# Patient Record
Sex: Female | Born: 1955 | Race: White | Hispanic: No | Marital: Married | Smoking: Former smoker
Health system: Southern US, Community
[De-identification: ages and names within clinical notes are randomized; demographics above are authoritative.]

## PROBLEM LIST (undated history)

## (undated) DIAGNOSIS — Z9289 Personal history of other medical treatment: Secondary | ICD-10-CM

## (undated) DIAGNOSIS — I10 Essential (primary) hypertension: Secondary | ICD-10-CM

## (undated) DIAGNOSIS — M1990 Unspecified osteoarthritis, unspecified site: Secondary | ICD-10-CM

## (undated) DIAGNOSIS — Z87442 Personal history of urinary calculi: Secondary | ICD-10-CM

## (undated) DIAGNOSIS — K5900 Constipation, unspecified: Secondary | ICD-10-CM

## (undated) DIAGNOSIS — E785 Hyperlipidemia, unspecified: Secondary | ICD-10-CM

## (undated) DIAGNOSIS — R112 Nausea with vomiting, unspecified: Secondary | ICD-10-CM

## (undated) DIAGNOSIS — M1712 Unilateral primary osteoarthritis, left knee: Secondary | ICD-10-CM

## (undated) DIAGNOSIS — Z9889 Other specified postprocedural states: Secondary | ICD-10-CM

## (undated) DIAGNOSIS — K219 Gastro-esophageal reflux disease without esophagitis: Secondary | ICD-10-CM

## (undated) HISTORY — PX: FOOT SURGERY: SHX648

## (undated) HISTORY — PX: CHOLECYSTECTOMY: SHX55

## (undated) HISTORY — PX: BACK SURGERY: SHX140

## (undated) HISTORY — PX: NECK SURGERY: SHX720

## (undated) HISTORY — PX: ABDOMINAL HYSTERECTOMY: SHX81

---

## 1999-07-25 ENCOUNTER — Encounter: Payer: Self-pay | Admitting: Neurological Surgery

## 1999-07-29 ENCOUNTER — Inpatient Hospital Stay (HOSPITAL_COMMUNITY): Admission: RE | Admit: 1999-07-29 | Discharge: 1999-07-30 | Payer: Self-pay | Admitting: Neurological Surgery

## 1999-07-29 ENCOUNTER — Encounter: Payer: Self-pay | Admitting: Neurological Surgery

## 1999-08-21 ENCOUNTER — Encounter: Admission: RE | Admit: 1999-08-21 | Discharge: 1999-08-21 | Payer: Self-pay | Admitting: Neurological Surgery

## 1999-08-21 ENCOUNTER — Encounter: Payer: Self-pay | Admitting: Neurological Surgery

## 2002-12-18 ENCOUNTER — Encounter: Payer: Self-pay | Admitting: Neurological Surgery

## 2002-12-18 ENCOUNTER — Observation Stay (HOSPITAL_COMMUNITY): Admission: RE | Admit: 2002-12-18 | Discharge: 2002-12-19 | Payer: Self-pay | Admitting: Neurological Surgery

## 2006-03-11 ENCOUNTER — Ambulatory Visit: Payer: Self-pay | Admitting: Internal Medicine

## 2006-03-24 ENCOUNTER — Ambulatory Visit (HOSPITAL_COMMUNITY): Admission: RE | Admit: 2006-03-24 | Discharge: 2006-03-24 | Payer: Self-pay | Admitting: Internal Medicine

## 2006-03-24 ENCOUNTER — Ambulatory Visit: Payer: Self-pay | Admitting: Internal Medicine

## 2006-03-24 HISTORY — PX: COLONOSCOPY: SHX174

## 2006-03-24 HISTORY — PX: ESOPHAGOGASTRODUODENOSCOPY: SHX1529

## 2006-06-08 ENCOUNTER — Ambulatory Visit: Payer: Self-pay | Admitting: Internal Medicine

## 2007-09-09 ENCOUNTER — Ambulatory Visit (HOSPITAL_COMMUNITY): Admission: RE | Admit: 2007-09-09 | Discharge: 2007-09-09 | Payer: Self-pay | Admitting: Neurological Surgery

## 2008-02-28 ENCOUNTER — Inpatient Hospital Stay (HOSPITAL_COMMUNITY): Admission: RE | Admit: 2008-02-28 | Discharge: 2008-03-03 | Payer: Self-pay | Admitting: Neurological Surgery

## 2009-09-16 ENCOUNTER — Encounter: Admission: RE | Admit: 2009-09-16 | Discharge: 2009-09-16 | Payer: Self-pay | Admitting: Neurological Surgery

## 2010-07-27 ENCOUNTER — Encounter: Payer: Self-pay | Admitting: Neurological Surgery

## 2010-11-18 NOTE — Op Note (Signed)
NAMECHANTILLY, Coleman                 ACCOUNT NO.:  [id number]   MEDICAL RECORD NO.:  [id number]          PATIENT TYPE:  INP   LOCATION:  3017                         FACILITY:  [facility]   PHYSICIAN:  Stefani Dama, M.D.  DATE OF BIRTH:  1956/04/15   DATE OF PROCEDURE:  02/28/2008  DATE OF DISCHARGE:                               OPERATIVE REPORT   PREOPERATIVE DIAGNOSIS:  Spondylosis L4-L5 with stenosis, status post  arthrodesis L5-S1 for spondylolisthesis.   POSTOPERATIVE DIAGNOSIS:  Spondylosis L4-L5 with stenosis, status post  arthrodesis L5-S1 for spondylolisthesis.   PROCEDURE:  Posterior decompression L4-L5 with decompression of L4 and  L5 nerve roots, total diskectomy, posterior lumbar interbody fusion L4-5  with PEEK spacers, local autograft and allograft, pedicle screw fixation  L4-L5, and posterolateral arthrodesis L4-L5 also.   SURGEON:  Stefani Dama, MD   FIRST ASSISTANT:  Payton Doughty, MD and Hardin Negus, PA   INDICATIONS:  Kathy Coleman is a 55 year old individual who has previously had  surgery to decompress and stabilize L5-S1 secondary to  spondylolisthesis.  This was done with a Ray cage technique in 1997.  Postoperatively, she had good relief of her symptoms and had been doing  well until the past year's time when she has had increasing amounts of  back pain with bilateral radicular symptoms.  She had failed efforts at  conservative management with intermittent epidural steroid injections  and is now taken to the operating room to undergo surgical decompression  and arthrodesis at the level of L4-L5.   PROCEDURE:  The patient was brought to the operating room and placed  supine on the stretcher.  After smooth induction of general endotracheal  anesthesia, she was turned prone.  The back was prepped with alcohol and  DuraPrep and draped in sterile fashion.  A midline incision was created  and carried down to lumbodorsal fascia.  The area of old surgery was  identified  and neck spinous process up was identified and the spinous  process just beyond this was identified positively with radiography.  Then, the transverse process at the L5 level was exposed and a self-  retaining retractor was placed deep in the wound to expose the L4-L5  space.  Then with using fluoroscopic guidance, pedicle entry sites were  chosen at the L4 level.  Pedicle entry sites were also chosen at L5.  Probes were passed and 6.5 mm tap was used tap holes into the bone.  The  bone holes were checked for lateral cutout, none was identified.  Then,  6.5 x 45 mm screws were placed in L4 and L5.  With the radiographic  confirmation of screw position, fluoro was removed and then the  procedure was continued by removing the inferior marginal lamina of L4  out to and including the complete facetectomies on either side to expose  the common dural tube and the takeoff of the L5 nerve roots.  There is  noted to be significant epidural veins in this area which bled  profusely.  These were cauterized and tamponaded to expose ultimately  the disk space  at L4-L5.  Disk space was then entered first on the left  side using a #15 blade and complete diskectomy was performed at this  opening and by placing a distracting instrument in the interspace at L4-  L5, the right side was exposed and the complete diskectomy was performed  here.  Once the diskectomy was performed and the bony surfaces were  appropriately prepared, 11-mm PEEK spacers were then placed into the  interspace.  They were filled with local autograft, allograft, and some  strips of Infuse.  The bone was then further packed into the disk space  to fill it completely.  Lateral gutters were then decorticated and  packed with similar combination of Infuse mixed with the remaining  allograft and autograft that was available and then the screws were  connected with 35-mm pre-contoured rods.  Rods were torqued down to  final position.  Area was  irrigated copiously and care was taken to make  sure that the L4 and L5 nerve roots were well decompressed in their  travel out the foramen and then the lumbodorsal fascia was  reapproximated with #1 Vicryl in interrupted fashion, 2-0 Vicryl used  subcutaneously, and 3-0 Vicryl subcuticularly.  Dry sterile dressing was  applied to the back.  The patient tolerated the procedure well.  Blood  loss estimated 500 mL.      Stefani Dama, M.D.  Electronically Signed     HJE/MEDQ  D:  02/28/2008  T:  02/29/2008  Job:  [id number]

## 2010-11-21 NOTE — Discharge Summary (Signed)
[place]. [facility]  Patient:    Kathy Coleman                           MRN: [id number] Adm. Date:  [id number] Disc. Date: [id number] Attending:  Jonne Ply                           Discharge Summary  ADMITTING DIAGNOSIS:  Cervical spondylosis with left cervical radiculopathy C4-5 and C5-6.  DISCHARGE DIAGNOSIS:  Cervical spondylosis with left cervical radiculopathy C4-5 and C5-6.  OPERATION:  Anterior cervical diskectomy and arthrodesis C4-5 and C5-6, stabilization with Synthes plate.  CONDITION ON DISCHARGE:  Improving.  HOSPITAL COURSE:  The patient is a 55 year old individual who has had significant left shoulder and left arm pain.  She had evidence of spondylitic disease with foraminal stenosis at C4-5 and C5-6.  She was advised regarding surgical intervention for decompression.  She underwent this procedure on July 29, 1999. She tolerated it well.  Her incision has remained clean and dry.  She had some difficulty with neck, shoulder, and left arm pain that persists at this time. However, she has been advised regarding her activities postoperatively, and will be seen in the office in three weeks time.  Preoperative laboratory studies were within limits of normal.  She was given a prescription for Percocet and Xanax as needed for pain and muscle relaxer respectively. DD:  07/30/99 TD:  07/31/99 Job: [id number]

## 2010-11-21 NOTE — Op Note (Signed)
Kathy Coleman, Kathy Coleman                           ACCOUNT NO.:  [id number]   MEDICAL RECORD NO.:  [id number]                   PATIENT TYPE:  INP   LOCATION:  3172                                 FACILITY:  [facility]   PHYSICIAN:  Stefani Dama, M.D.               DATE OF BIRTH:  03-May-1956   DATE OF PROCEDURE:  12/18/2002  DATE OF DISCHARGE:                                 OPERATIVE REPORT   PREOPERATIVE DIAGNOSIS:  Cervical spondylosis with radiculopathy, C6-C7.  Radiculopathy on the left.   POSTOPERATIVE DIAGNOSIS:  Cervical spondylosis with radiculopathy, C6-C7.  Radiculopathy on the left.   OPERATION PERFORMED:  Anterior cervical decompression, C6-C7.  Arthrodesis  with structural allograft, Synthes plate fixation, C6-C7, removal of  previous hardware C4 to C6.   SURGEON:  Stefani Dama, M.D.   ASSISTANT:  Cristi Loron, M.D.   ANESTHESIA:  General endotracheal.   INDICATIONS FOR PROCEDURE:  The patient is a mid40s individual who has had  anterior cervical diskectomy and arthrodesis at C4-5 and C5-6 approximately  four years ago.  She had good relief of her symptoms but more recently has  developed some left cervical radicular symptoms.  This was related to  spondylitic changes at the C6-C7 level with a large left-sided foraminal  osteophyte.  She was advised regarding conservative treatment; however,  failing a trial of this for the past three months' time, she was advised  that surgical intervention may be the only reasonable solution to consider.  She is taken to the operating room at this time.   DESCRIPTION OF PROCEDURE:  The patient was brought to the operating room  supine on the stretcher.  After smooth induction of general endotracheal  anesthesia, she was placed in five pounds of halter traction.  The neck was  shaved, prepped with DuraPrep and draped in a sterile fashion.  A transverse  incision was made in the left side of her neck through her previously  made  incision.  This was carried down through the platysma and the plane between  the sternocleidomastoid muscle and the strap muscles was then dissected  bluntly until the prevertebral space was reached.  The old plate was  identified and then this was gradually dissected free and skeletonized so  that the six locking screws could be removed by the six plate screws.  The  inferior margin of the plate was noted to have some bone  growing up and  around it and this required the use of a small surgical osteotome to break  free this overgrowing bone and allow the plate to come loose.  The plate was  removed.  The underlying surface was then inspected and there was found to  be a solid arthrodesis at the C4-5 and the C5-6 levels.  After dressing away  some of the soft tissues and the bleeding margins with a bipolar cautery and  some small pledgets of Gelfoam soaked in thrombin that were later irrigated  away, attention was then turned to the C6-C7 disk space.  Self-retaining  Caspar retractor was placed under the longus coli muscle on either side at  this level  to expose the disk space more fully.  The decompression was then  performed by entering the anterior margin of the disk with a 15 blade using  a combination of Kerrison rongeurs then to remove the quantity of disk that  was noted to be significantly desiccated and dehydrated.  The lateral  margins were then decompressed and there was noted to be a significant  uncinate spur on both the right side and even more so on the left side at  the level of C6-C7.  This was drilled down with a 2.3 mm dissecting tool.  Self-retaining disk space spreader was placed in the wound  to ease this  dissection.  This was accomplished from side-to-side and top to bottom with  the help of Dr. Lovell Sheehan, who provided retraction and irrigation during the  dissection procedure.  The posterior longitudinal ligament was opened from  side-to-side completely.   Hemostasis in the epidural space was obtained with  some small pledgets of Gelfoam soaked in thrombin which were later again  irrigated away and hemostasis was checked.  A 7 mm lordotic graft was then  placed into the interspace.  Retraction was removed.  Once the decompression  and arthrodesis was performed, anterior fixation was performed with an 18mm  standard size Synthes plate.  The screws were then locked into position and  a localizing radiograph identified good position of the surgical construct.  The wound was then checked for hemostasis and ultimately platysma was closed  with 3-0 Vicryl in interrupted fashion.  3-0 Vicryl and 4-0 Vicryl was used  in the subcuticular tissues.  DermaBond was placed on the skin.  The patient  tolerated the procedure well and was returned to the recovery room in stable  condition.                                                Stefani Dama, M.D.    Merla Riches  D:  12/18/2002  T:  12/18/2002  Job:  [id number]

## 2010-11-21 NOTE — H&P (Signed)
[place]. [facility]  Patient:    Kathy Coleman                           MRN: [id number] Adm. Date:  [id number] Attending:  Jonne Ply                         History and Physical  ADMISSION DIAGNOSIS:  Cervical spondylosis, C4-5 and C5-6, with right cervical radiculopathy.  HISTORY OF PRESENT ILLNESS:  Patient is a 55 year old individual whose has had significant problems with neck, shoulder and arm pain on the right side.  She was treated previously for degenerative spondylolisthesis in 1997 and underwent surgical arthrodesis; she did well from that process.  She notes that for a several-month period of time, she has been developing increasing problems with ain in the neck, shoulder and right arm, with numbness and tingling, and an MRI was  performed that demonstrates that has significant spondylitic disease at C4-5 and C5-6 with a broad-based disc herniation at C4-5 and even broader-based disc herniation at C5-6 and early spinal cord compression.  She has had significant rm pain, her sleep is interrupted, her grip strength is not as good as it has been on the right-hand side, but she also notes that symptoms have been progressing now to the left-hand side.  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  Her health has otherwise been good.  Lower extremity symptoms have  been nonexistent.  PAST MEDICAL HISTORY:  Her general health has been good.  Back surgery was performed in 1997 and she recovered fully and notes that she can do all of her normal activities without restriction.  She does not smoke.  She does not drink  alcohol.  Her height and weight have been stable at 5 feet 6 inches and 150 pounds.  FAMILY HISTORY:  Negative for any significant medical problems.  REVIEW OF SYSTEMS:  It is noted that she has arm pain and arm weakness and neck  pain; other than that, she wears glasses and the systems sheet  was reviewed with the patient.  MEDICATIONS:  Patient takes Prilosec for acid reflux.  PHYSICAL EXAMINATION:  NEUROLOGIC:  Physical examination reveals that she is alert, oriented, cooperative individual in no overt distress.  Range of motion of her neck reveals that she turns to the right 60 degrees and turns to the left to 80 degrees.  Flexing and  turning to the right reproduces pain in the right shoulder with a positive Spurlings.  She extends and flexes normally.  Axial compression does not reproduce pain.  Otherwise, motor strength in the upper extremities reveals that the deltoids are 5/5, biceps strength is 5/5, wrist extensors are weak on both sides, 4/5, and triceps, grip strength and intrinsics appear to be normal.  Lower extremity strength and reflexes are within the limits of normal.  Deep tendon reflexes are 2+ and both biceps and 2+ in the right triceps, 1+ in the left triceps, 3+ in the patellae, 2+ in the Achilles.  Babinskis are downgoing.  Sensation is intact to pin and vibration in the distal upper extremities.  Palpation of the neck reveals no masses.  No bruits are heard.  Supraclavicular fossa reveals tenderness on the right side to palpation.  Cranial nerve examination reveals the pupils are 4+ and briskly reactive to light and accommodation.  The extraocular movements are full. The  face is symmetric to grimace.  Tongue and uvula are in the midline. Sclerae and conjunctivae are clear.  NECK:  Clear to auscultation.  HEART:  Regular rate and rhythm.  There are no murmurs heard.  ABDOMEN:  Soft.  Bowel sounds are positive.  No masses are palpable.  EXTREMITIES:  No clubbing, cyanosis, or edema.  IMPRESSION:  The patient has evidence of cervical spondylitic disease with broad-based disc herniation at C4-5 and C5-6.  She has right cervical radiculopathy primarily.  She is now being admitted to undergo surgical extirpation of the two levels  involved, followed by arthrodesis using allograft and Synthes fixation.DD:  07/29/99 TD:  07/29/99 Job: [id number]

## 2010-11-21 NOTE — H&P (Signed)
NAMESKYLER, Kathy Coleman                 ACCOUNT NO.:  [id number]   MEDICAL RECORD NO.:  [id number]         PATIENT TYPE:  AMB   LOCATION:  DAY                           FACILITY:  [facility]   PHYSICIAN:  R. Roetta Sessions, M.D. DATE OF BIRTH:  December 08, 1955   DATE OF ADMISSION:  03/11/3006  DATE OF DISCHARGE:  LH                                HISTORY & PHYSICAL   CHIEF COMPLAINT:  Heartburn, epigastric burning.   HISTORY OF PRESENT ILLNESS:  The patient is a 55 year old lady who has a  history of gastroesophageal reflux disease who presents for further  evaluation of recent refractory symptoms.  She was last seen in March 2001.  She had an EGD back in October 1999, had distal esophageal erosions  consistent with erosive reflux esophagitis.  She had a history of dysphagia  and a 59 French Maloney dilator was used without any obvious complications.  She says she had done well up until about 1-2 weeks ago.  She started having  significant burning in her substernal area and epigastrium.  She was  complaining of indigestion.  She feels like she has a lump in her throat.  She has also, over the last several months, noticed increased in difficulty  swallowing certain foods.  She is not able to eat red meat, at all, as she  feels it gets stuck in her esophagus.  She complains of epigastric burning.  She tends to lean towards constipation but for the last one week, has had  one loose stool a day.  She denies any melena or rectal bleeding.  She  started taking OTC Prilosec 40 mg every morning.  This has helped with the  nausea but not really with the burning or indigestion.  She never had a  colonoscopy.   CURRENT MEDICATIONS:  Prilosec OTC 40 mg q.a.m., Aleve p.r.n., usually 3-4  per week.   ALLERGIES:  No known drug allergies.   PAST MEDICAL HISTORY:  Gastroesophageal reflux disease.   PAST SURGICAL HISTORY:  She has had one lumbar back surgery and two cervical  neck surgeries.  She has had a  cholecystectomy for biliary dyskinesia and a  partial hysterectomy.   FAMILY HISTORY:  Mother is 43, has diabetes and COPD.  Father died at age  19, had an MI.  No family history of colorectal cancer.   SOCIAL HISTORY:  She is married with one grandchild.  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  She quit smoking  over 20 years ago.  No alcohol use.   REVIEW OF SYSTEMS:  See HPI for GI.  CONSTITUTIONAL:  No weight loss.  CARDIOPULMONARY:  No chest pain or shortness of breath.   PHYSICAL EXAMINATION:  VITAL SIGNS:  Weight 190 pounds, height 5 foot 7 inches, temperature 98.4,  blood pressure 130/80, pulse 84.  GENERAL:  Pleasant well developed, well nourished Caucasian female in no  acute distress.  SKIN:  Warm and dry, no jaundice.  HEENT:  Sclerae nonicteric.  Oropharyngeal mucosa moist and pink.  No  lesions.  No erythema.  No exudate.  NECK:  No lymphadenopathy or thyromegaly.  CHEST:  Clear to auscultation.  CARDIAC:  Regular rate and rhythm, normal S1 and S2, no murmurs, gallops,  and rubs.  ABDOMEN:  Positive bowel sounds, soft, nontender, nondistended, no  organomegaly or masses, no rebound tenderness or guarding, no abdominal  bruits or herniae.  EXTREMITIES:  No edema.   IMPRESSION:  Ms. Breau is a 55 year old lady with a history of chronic GERD  who recently had a flare up of symptoms.  She has not been on daily PPI but  recently started back on omeprazole with noted improvement of her symptoms.  She also complains of esophageal solid food dysphagia.  Last esophageal  dilatation is outlined above.  She typically has constipation with recent  loose stools.   PLAN:  EGD with esophageal dilatation in the future.  She is also requesting  colonoscopy as she has never had one in the past.      Tana Coast, Pricilla Larsson, M.D.  Electronically Signed    LL/MEDQ  D:  03/11/2006  T:  03/11/2006  Job:  [id number]    cc:   Wyvonnia Lora  Fax: [phone]

## 2010-11-21 NOTE — Op Note (Signed)
Kathy Coleman, Kathy Coleman                 ACCOUNT NO.:  [id number]   MEDICAL RECORD NO.:  [id number]          PATIENT TYPE:  AMB   LOCATION:  DAY                           FACILITY:  [facility]   PHYSICIAN:  R. Roetta Sessions, M.D. DATE OF BIRTH:  11-24-1955   DATE OF PROCEDURE:  03/24/2006  DATE OF DISCHARGE:                                 OPERATIVE REPORT   PROCEDURE:  Esophagogastroduodenoscopy with Elease Hashimoto dilation and screening  colonoscopy.   INDICATIONS FOR PROCEDURE:  Patient is a 55 year old lady with apparent  fracture with gastroesophageal reflux disease, symptoms of recurrent  esophageal dysphagia.  She has been on some OTC omeprazole recently without  any improvement in her symptoms.  We gave her some Zegerid 40 mg capsules  recently.  She felt they were too large and was not able to swallow them.  She also said she put the contents of the capsule in some applesauce, and  this made her sick, so she really has not been on acid suppression therapy  here recently.  She takes Aleve p.r.n.  EGD is now being done, and the  potential for esophageal dilation reviewed.  She has never had her lower GI  tract imaged.  There is no family history of colorectal neoplasia.  EGD and  colonoscopy are now being done.  This approach has been discussed with the  patient at length.  Potential risks, benefits and alternatives have been  reviewed and questions answered.  She is agreeable.  Please see  documentation on the medical record.   PROCEDURE NOTE:  O2 saturation, blood pressure, pulses, and respirations  were monitored throughout the entire procedure.  Conscious sedation with  __________ 125 mg IV and Versed 5 mg IV in divided doses.  Cetacaine spray  for topical oropharyngeal anesthesia.   FINDINGS:  EGD examination of the tubular esophagus revealed no mucosal  abnormalities.  The EG junction was easy to traverse.   STOMACH:  Gastric cavity was empty and insufflated well with air.  A  thorough examination of the gastric mucosa included a retroflexed view of  the proximal stomach.  The esophagogastric junction demonstrated a couple of  antral erosions, otherwise the gastric mucosa appeared normal.  The pylorus  was patent and easily transversed.  Examination of the bulb and second  portion revealed no abnormalities.   THERAPEUTICS/DIAGNOSTIC MANEUVERS PERFORMED:  A 56 French Maloney dilator  was passed to full insertion with ease.  A look back revealed no apparent  complications related to the passage of the dilator.  The patient tolerated  the procedure well and was prepared for colonoscopy.   FINDINGS:  Digital rectal exam revealed no abnormalities.   ENDOSCOPIC FINDINGS:  Prep was good.   RECTAL:  Examination of the rectal mucosa, including retroflexion of the  anal verge, revealed a couple of anal papillae, otherwise rectal mucosa  appeared normal.   COLON:  The colonic mucosa was surveyed from the rectosigmoid junction to  the left transverse, right colon, the appendiceal orifice, the ileocecal  valve, and cecum.  These structures were well seen and photographed  for the  record.  From this level, the scope was slowly withdrawn.  All previously  mentioned mucosal surfaces were again seen.  The patient was noted to have  some scattered sigmoid diverticula, although the remainder of the colonic  mucosa appeared normal.  The patient tolerated the procedure well and was  reactive.   ENDOSCOPY IMPRESSION:  1. Esophagogastroduodenoscopy:  Normal esophagus.  A couple of antral      erosions, otherwise normal stomach, patent pylorus, normal D1, D2.  2. Colonoscopy findings:  A couple of anal papillae, otherwise normal      rectum with left-sided diverticula.  The remainder of the colonic      mucosa appeared normal.   RECOMMENDATIONS:  1. GERD literature and diverticulosis literature were provided to Ms.      Arturo Morton.  2. I began Prevacid 30 mg solutabs 1 tablet  each morning.  3. Follow-up appointment with me in three months.      Jonathon Bellows, M.D.  Electronically Signed     RMR/MEDQ  D:  03/24/2006  T:  03/25/2006  Job:  [id number]   cc:   Wyvonnia Lora  Fax: [phone]

## 2010-11-21 NOTE — Op Note (Signed)
[place]  Patient:    Kathy Coleman                           MRN: [id number] Proc. Date: 07/29/99 Adm. Date:  [id number] Attending:  Jonne Ply                           Operative Report  PREOPERATIVE DIAGNOSIS: Cervical spondylosis with right cervical radiculopathy.  POSTOPERATIVE DIAGNOSIS:  Anterior cervical diskectomy and arthrodesis, C4-C5 and C5-C6, Synthes fixation.  SURGEON: Stefani Dama, M.D.  ASSISTANT: Alanson Aly. Roxan Hockey, M.D.  ANESTHESIA: General endotracheal.  INDICATIONS: The patient is a 55 year old individual who has had significant neck, shoulder and right arm pain.  She has significant spondylitic disease at L4-L5 nd L5-L6.  DESCRIPTION OF PROCEDURE: The patient was brought to the operating room and placed on the table in supine position.  After smooth induction of general endotracheal anesthesia, she was placed in five pounds of traction.  The neck was shaved and  prepped with Dura-Prep and draped in sterile fashion.  A transverse incision was made in the midportion of the neck on the left side.  This was carried down through the platysma and the plane between the sternocleidomastoid and the strap muscles was dissected bluntly until the prevertebral space was reached.  The first identifiable disk space was noted to be that of C4-C5.  Diskectomy was then performed using a combination of Kerrison rongeurs to remove a significant quantity of markedly degenerated disk material.  As the posterior longitudinal ligament as reached there was noted to be thickening of this ligament with disk material that was prolapsed beyond the confines of the vertebral body.  As this was decompressed bone spurs that were forming from the superior margin of the C5 vertebrae were taken down with Midas Rex and an A2 bur.  The uncinate process was similarly taken down.  The lateral recess was decompressed in this area.   However, large epidural veins were encountered.  These bled profusely and required repetitive packing with Gelfoam and thrombin which, after hemostasis was achieved these packings were later removed.  The area was then decompressed out to the left side in similar fashion and a 7 mm round fibular graft was placed into the interspace.  Attention was turned to C5-C6 and a similar procedure was carried out here noting again similar subligamentous protrusion of disk material  With this area decompressed and hemostasis being achieved in a similar fashion 7 mm round fibular graft was placed.  Traction was removed and the neck was placed in slight flexion.  A 37 mm Synthes plate was then contoured to the appropriate shape and placed over the interspaces involved and secured with six locking 4 x 14 mm  screws.  Localizing radiograph identified good position of the hardware and the  implants.  The area was then checked for hemostasis in the soft tissue and the platysma was closed with 2-0 Vicryl in interrupted fashion.  Then 3-0 Vicryl was used subcuticularly to close the skin.  A dry sterile dressing was placed on the skin. DD:  07/29/99 TD:  07/29/99 Job: [id number]

## 2010-11-21 NOTE — Discharge Summary (Signed)
NAMEALIMA, Kathy Coleman                 ACCOUNT NO.:  [id number]   MEDICAL RECORD NO.:  [id number]          PATIENT TYPE:  INP   LOCATION:  3017                         FACILITY:  [facility]   PHYSICIAN:  Stefani Dama, M.D.  DATE OF BIRTH:  12/08/1955   DATE OF ADMISSION:  02/28/2008  DATE OF DISCHARGE:  03/03/2008                               DISCHARGE SUMMARY   ADMITTING DIAGNOSES:  Lumbar spondylosis, lumbar radiculopathy L4-L5.   DISCHARGE DIAGNOSES:  Lumbar spondylosis, lumbar radiculopathy L4-L5.   OPERATION PROCEDURE:  Posterior spinal fusion with posterior lumbar  interbody fusion, L4-L5 with pedicle screw fixation, decompression of  the L4 and L5 nerve roots by Dr. Barnett Abu.   BRIEF HISTORY:  The patient is a 55 year old female who had ongoing low  back pain, progressively worsening with bilateral lower extremity  radiculopathy.  Found to have spondylosis and degenerative disk disease  at L4-L5, failed conservative measures, likes to proceed with surgical  intervention.   HOSPITAL COURSE:  The patient underwent surgical intervention and  posterior spinal fusion and decompression of L4-L5 on 02/28/2008.  She  tolerated the procedure well, stable at recovery room, placed on the  floor, and placed on PCA Dilaudid pump.  Aspen QuikDraw brace was  obtained.  Sequential compression devices, prophylaxis against DVT.  First day postoperatively, the patient was eating well.  Vital signs  stable.  Wounds are being.  Neurovascularly intact, no focal deficits.  She was weaned off her PCA Dilaudid pump to p.o. pain medicines.  Foley  catheter was discontinued.  Started with physical therapy, occupational  therapy.  Discharge planning was arranged.  Her diet was advanced.  On  the second day postoperatively, the patient was doing okay.  We  increased Percocet 2 p.o. q.4 to 6 h. p.r.n. pain.  She was  neurovascularly intact.  Vital signs are stable.  No focal deficits.  She did have some  left anterior thigh pain.  Her wound has some mild  drainage, but no signs of infection.  Dressing was reinforced.  Discharge planning was arranged.  She was ready for discharge home on  03/02/2008.  CT scan post surgery was obtained because of the left  anterior thigh pain and showed good placement of the pedicle screws.  No  nerve compression.  Eating well, voiding well, and neurovascularly  intact.  Vital signs, stable and afebrile.   DISCHARGE CONDITION:  Stable and improved.   DISCHARGE INSTRUCTIONS:  Discharged home with prescriptions for Percocet  1 p.o. q.4 to 6 h. p.r.n. pain, Valium 5 mg 1 p.o. q.6 to 8 h. p.r.n.  muscle spasm.  Continue with physical therapy and occupational therapy  until discharge, immobilization.  Continue back precautions.  Follow up  with Dr. Danielle Dess in 3 weeks.  Contact our office prior to follow up if  she has any questions or concerns.      Aura Fey Bobbe Medico.      Stefani Dama, M.D.  Electronically Signed    SCI/MEDQ  D:  05/09/2008  T:  05/10/2008  Job:  [id number]

## 2013-05-26 ENCOUNTER — Other Ambulatory Visit (HOSPITAL_COMMUNITY): Payer: Self-pay | Admitting: Neurological Surgery

## 2013-05-26 ENCOUNTER — Other Ambulatory Visit: Payer: Self-pay | Admitting: Neurological Surgery

## 2013-05-26 DIAGNOSIS — IMO0002 Reserved for concepts with insufficient information to code with codable children: Secondary | ICD-10-CM

## 2013-05-30 ENCOUNTER — Encounter (HOSPITAL_COMMUNITY): Payer: Self-pay

## 2013-06-07 ENCOUNTER — Encounter (HOSPITAL_COMMUNITY): Payer: Self-pay

## 2013-06-07 ENCOUNTER — Ambulatory Visit (HOSPITAL_COMMUNITY)
Admission: RE | Admit: 2013-06-07 | Discharge: 2013-06-07 | Disposition: A | Discharge status: Home / Self Care | Payer: Medicare FFS | Source: Ambulatory Visit | Attending: Neurological Surgery | Admitting: Neurological Surgery

## 2013-06-07 DIAGNOSIS — M51379 Other intervertebral disc degeneration, lumbosacral region without mention of lumbar back pain or lower extremity pain: Secondary | ICD-10-CM | POA: Insufficient documentation

## 2013-06-07 DIAGNOSIS — M47817 Spondylosis without myelopathy or radiculopathy, lumbosacral region: Secondary | ICD-10-CM | POA: Insufficient documentation

## 2013-06-07 DIAGNOSIS — I7090 Unspecified atherosclerosis: Secondary | ICD-10-CM | POA: Insufficient documentation

## 2013-06-07 DIAGNOSIS — IMO0002 Reserved for concepts with insufficient information to code with codable children: Secondary | ICD-10-CM

## 2013-06-07 DIAGNOSIS — Z981 Arthrodesis status: Secondary | ICD-10-CM | POA: Insufficient documentation

## 2013-06-07 DIAGNOSIS — I700 Atherosclerosis of aorta: Secondary | ICD-10-CM | POA: Insufficient documentation

## 2013-06-07 DIAGNOSIS — M5137 Other intervertebral disc degeneration, lumbosacral region: Secondary | ICD-10-CM | POA: Insufficient documentation

## 2013-06-07 MED ORDER — IOHEXOL 180 MG/ML  SOLN
20.0000 mL | Freq: Once | INTRAMUSCULAR | Status: AC | PRN
  Administered 2013-06-07: 14 mL via INTRATHECAL

## 2013-06-07 MED ORDER — HYDROCODONE-ACETAMINOPHEN 5-325 MG PO TABS
1.0000 | ORAL_TABLET | ORAL | Status: DC | PRN
  Filled 2013-06-07: qty 2

## 2013-06-07 MED ORDER — ONDANSETRON HCL 4 MG/2ML IJ SOLN: 4.0000 mg | Freq: Four times a day (QID) | INTRAMUSCULAR | Status: DC | PRN

## 2013-06-07 MED ORDER — DIAZEPAM 5 MG PO TABS
10.0000 mg | ORAL_TABLET | Freq: Once | ORAL | Status: AC
  Administered 2013-06-07: 10 mg via ORAL
  Filled 2013-06-07: qty 2

## 2013-06-07 MED ORDER — DIAZEPAM 5 MG PO TABS
ORAL_TABLET | ORAL | Status: AC
  Administered 2013-06-07: 10 mg via ORAL
  Filled 2013-06-07: qty 2

## 2013-06-07 NOTE — Procedures (Signed)
Kathy Coleman is a 57 year old individual is had previous surgery at L5-S1 L4-L5 she has advanced evidence of degenerative changes at the adjacent level LIII-L4 has a presence of hardware myelogram is now being performed to better visualize any significant stenosis at the level of L3-L4.  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 11 cc Findings: Degenerative stenosis L3-L4 myelogram

## 2013-06-15 DIAGNOSIS — M25559 Pain in unspecified hip: Secondary | ICD-10-CM | POA: Insufficient documentation

## 2013-06-16 ENCOUNTER — Other Ambulatory Visit: Payer: Self-pay | Admitting: Neurological Surgery

## 2013-06-16 DIAGNOSIS — M25551 Pain in right hip: Secondary | ICD-10-CM

## 2013-06-28 ENCOUNTER — Ambulatory Visit
Admission: RE | Admit: 2013-06-28 | Discharge: 2013-06-28 | Disposition: A | Discharge status: Home / Self Care | Payer: Medicare FFS | Source: Ambulatory Visit | Attending: Neurological Surgery | Admitting: Neurological Surgery

## 2013-06-28 DIAGNOSIS — M25551 Pain in right hip: Secondary | ICD-10-CM

## 2013-06-28 MED ORDER — IOHEXOL 180 MG/ML  SOLN
1.0000 mL | Freq: Once | INTRAMUSCULAR | Status: AC | PRN
  Administered 2013-06-28: 1 mL via INTRA_ARTICULAR

## 2013-06-28 MED ORDER — METHYLPREDNISOLONE ACETATE 40 MG/ML INJ SUSP (RADIOLOG
120.0000 mg | Freq: Once | INTRAMUSCULAR | Status: AC
  Administered 2013-06-28: 120 mg via INTRA_ARTICULAR

## 2013-08-03 DIAGNOSIS — M5414 Radiculopathy, thoracic region: Secondary | ICD-10-CM | POA: Insufficient documentation

## 2013-08-24 DIAGNOSIS — M5417 Radiculopathy, lumbosacral region: Secondary | ICD-10-CM | POA: Insufficient documentation

## 2013-09-14 DIAGNOSIS — G8929 Other chronic pain: Secondary | ICD-10-CM | POA: Insufficient documentation

## 2014-06-22 ENCOUNTER — Other Ambulatory Visit: Payer: Self-pay | Admitting: Neurological Surgery

## 2014-07-12 NOTE — Pre-Procedure Instructions (Signed)
Kathy Coleman  07/12/2014   Your procedure is scheduled on:  Monday, January 18th  [facility] at 645 AM.  Call this number if you have problems the morning of surgery: [phone]   Remember:   Do not eat food or drink liquids after midnight.   Take these medicines the morning of surgery with A SIP OF WATER: ultram or hydrocodone if needed   Do not wear jewelry, make-up or nail polish.  Do not wear lotions, powders, or perfumes. You may wear deodorant.  Do not shave 48 hours prior to surgery. Men may shave face and neck.  Do not bring valuables to the hospital.  [facility] is not responsible  for any belongings or valuables.               Contacts, dentures or bridgework may not be worn into surgery.  Leave suitcase in the car. After surgery it may be brought to your room.  For patients admitted to the hospital, discharge time is determined by your  treatment team.      Please read over the following fact sheets that you were given: Pain Booklet, Coughing and Deep Breathing, Blood Transfusion Information, MRSA Information and Surgical Site Infection Prevention  [place] - Preparing for Surgery  Before surgery, you can play an important role.  Because skin is not sterile, your skin needs to be as free of germs as possible.  You can reduce the number of germs on you skin by washing with CHG (chlorahexidine gluconate) soap before surgery.  CHG is an antiseptic cleaner which kills germs and bonds with the skin to continue killing germs even after washing.  Please DO NOT use if you have an allergy to CHG or antibacterial soaps.  If your skin becomes reddened/irritated stop using the CHG and inform your nurse when you arrive at Short Stay.  Do not shave (including legs and underarms) for at least 48 hours prior to the first CHG shower.  You may shave your face.  Please follow these instructions carefully:   1.  Shower with CHG Soap the night before  surgery and the morning of Surgery.  2.  If you choose to wash your hair, wash your hair first as usual with your normal shampoo.  3.  After you shampoo, rinse your hair and body thoroughly to remove the shampoo.  4.  Use CHG as you would any other liquid soap.  You can apply CHG directly to the skin and wash gently with scrungie or a clean washcloth.  5.  Apply the CHG Soap to your body ONLY FROM THE NECK DOWN.  Do not use on open wounds or open sores.  Avoid contact with your eyes, ears, mouth and genitals (private parts).  Wash genitals (private parts) with your normal soap.  6.  Wash thoroughly, paying special attention to the area where your surgery will be performed.  7.  Thoroughly rinse your body with warm water from the neck down.  8.  DO NOT shower/wash with your normal soap after using and rinsing off the CHG Soap.  9.  Pat yourself dry with a clean towel.            10.  Wear clean pajamas.            11.  Place clean sheets on your bed the night of your first shower and do not sleep with pets.  Day of Surgery  Do not  apply any lotions/deoderants the morning of surgery.  Please wear clean clothes to the hospital/surgery center.

## 2014-07-13 ENCOUNTER — Encounter (HOSPITAL_COMMUNITY): Payer: Self-pay

## 2014-07-13 ENCOUNTER — Encounter (HOSPITAL_COMMUNITY)
Admission: RE | Admit: 2014-07-13 | Discharge: 2014-07-13 | Disposition: A | Discharge status: Home / Self Care | Payer: Medicare FFS | Source: Ambulatory Visit | Attending: Neurological Surgery | Admitting: Neurological Surgery

## 2014-07-13 DIAGNOSIS — Z87891 Personal history of nicotine dependence: Secondary | ICD-10-CM | POA: Insufficient documentation

## 2014-07-13 DIAGNOSIS — Z01818 Encounter for other preprocedural examination: Secondary | ICD-10-CM | POA: Insufficient documentation

## 2014-07-13 DIAGNOSIS — I10 Essential (primary) hypertension: Secondary | ICD-10-CM | POA: Diagnosis not present

## 2014-07-13 DIAGNOSIS — I252 Old myocardial infarction: Secondary | ICD-10-CM | POA: Diagnosis not present

## 2014-07-13 HISTORY — DX: Other specified postprocedural states: Z98.890

## 2014-07-13 HISTORY — DX: Essential (primary) hypertension: I10

## 2014-07-13 HISTORY — DX: Unspecified osteoarthritis, unspecified site: M19.90

## 2014-07-13 HISTORY — DX: Nausea with vomiting, unspecified: R11.2

## 2014-07-13 LAB — SURGICAL PCR SCREEN
MRSA, PCR: POSITIVE — AB
Staphylococcus aureus: POSITIVE — AB

## 2014-07-13 LAB — CBC
HCT: 39.8 % (ref 36.0–46.0)
Hemoglobin: 13.2 g/dL (ref 12.0–15.0)
MCH: 30.8 pg (ref 26.0–34.0)
MCHC: 33.2 g/dL (ref 30.0–36.0)
MCV: 93 fL (ref 78.0–100.0)
Platelets: 221 10*3/uL (ref 150–400)
RBC: 4.28 MIL/uL (ref 3.87–5.11)
RDW: 12.7 % (ref 11.5–15.5)
WBC: 5.4 10*3/uL (ref 4.0–10.5)

## 2014-07-13 LAB — BASIC METABOLIC PANEL
Anion gap: 5 (ref 5–15)
BUN: 15 mg/dL (ref 6–23)
CALCIUM: 9.4 mg/dL (ref 8.4–10.5)
CHLORIDE: 105 meq/L (ref 96–112)
CO2: 30 mmol/L (ref 19–32)
Creatinine, Ser: 0.88 mg/dL (ref 0.50–1.10)
GFR calc Af Amer: 82 mL/min — ABNORMAL LOW (ref >90)
GFR calc non Af Amer: 71 mL/min — ABNORMAL LOW (ref >90)
Glucose, Bld: 106 mg/dL — ABNORMAL HIGH (ref 70–99)
POTASSIUM: 4.4 mmol/L (ref 3.5–5.1)
Sodium: 140 mmol/L (ref 135–145)

## 2014-07-13 NOTE — Progress Notes (Signed)
CALLED IN MUPIROCIN RX. TO [facility].

## 2014-07-13 NOTE — Progress Notes (Signed)
Anesthesia Chart Review:  Pt is 59 year old female scheduled for L2-3, L3-4 PLIF on 07/23/2014 with Dr. Danielle DessElsner.   PMH includes: HTN. Former smoker.   Preoperative labs reviewed.    EKG: NSR. Inferior infarct, age undetermined. Possible anterior infarct, age undetermined. Appears unchanged from previous from 2001 and 2009.   If no changes, I anticipate pt can proceed with surgery as scheduled.   Rica Mastngela , FNP-BC [facility]/Anesthesiology Phone: [phone] 07/13/2014 4:07 PM

## 2014-07-22 MED ORDER — CEFAZOLIN SODIUM-DEXTROSE 2-3 GM-% IV SOLR
2.0000 g | INTRAVENOUS | Status: AC
  Administered 2014-07-23 (×2): 2 g via INTRAVENOUS
  Filled 2014-07-22: qty 50

## 2014-07-23 ENCOUNTER — Inpatient Hospital Stay (HOSPITAL_COMMUNITY)
Admission: RE | Admit: 2014-07-23 | Discharge: 2014-07-26 | DRG: 460 | Disposition: A | Discharge status: Home / Self Care | Payer: Medicare FFS | Source: Ambulatory Visit | Attending: Neurological Surgery | Admitting: Neurological Surgery

## 2014-07-23 ENCOUNTER — Encounter (HOSPITAL_COMMUNITY): Payer: Self-pay | Admitting: *Deleted

## 2014-07-23 ENCOUNTER — Inpatient Hospital Stay (HOSPITAL_COMMUNITY): Payer: Medicare FFS

## 2014-07-23 ENCOUNTER — Inpatient Hospital Stay (HOSPITAL_COMMUNITY): Payer: Medicare FFS | Admitting: Certified Registered Nurse Anesthetist

## 2014-07-23 ENCOUNTER — Encounter (HOSPITAL_COMMUNITY)
Admission: RE | Disposition: A | Discharge status: Home / Self Care | Payer: Medicare FFS | Source: Ambulatory Visit | Attending: Neurological Surgery

## 2014-07-23 ENCOUNTER — Inpatient Hospital Stay (HOSPITAL_COMMUNITY): Payer: Medicare FFS | Admitting: Emergency Medicine

## 2014-07-23 DIAGNOSIS — Z87891 Personal history of nicotine dependence: Secondary | ICD-10-CM | POA: Diagnosis not present

## 2014-07-23 DIAGNOSIS — M48061 Spinal stenosis, lumbar region without neurogenic claudication: Secondary | ICD-10-CM

## 2014-07-23 DIAGNOSIS — G9741 Accidental puncture or laceration of dura during a procedure: Secondary | ICD-10-CM | POA: Diagnosis not present

## 2014-07-23 DIAGNOSIS — Y658 Other specified misadventures during surgical and medical care: Secondary | ICD-10-CM | POA: Diagnosis not present

## 2014-07-23 DIAGNOSIS — D62 Acute posthemorrhagic anemia: Secondary | ICD-10-CM | POA: Diagnosis not present

## 2014-07-23 DIAGNOSIS — M4806 Spinal stenosis, lumbar region: Secondary | ICD-10-CM | POA: Diagnosis present

## 2014-07-23 DIAGNOSIS — M4726 Other spondylosis with radiculopathy, lumbar region: Secondary | ICD-10-CM | POA: Diagnosis present

## 2014-07-23 DIAGNOSIS — M5416 Radiculopathy, lumbar region: Secondary | ICD-10-CM | POA: Diagnosis present

## 2014-07-23 DIAGNOSIS — S0500XA Injury of conjunctiva and corneal abrasion without foreign body, unspecified eye, initial encounter: Secondary | ICD-10-CM | POA: Diagnosis not present

## 2014-07-23 DIAGNOSIS — I10 Essential (primary) hypertension: Secondary | ICD-10-CM | POA: Diagnosis present

## 2014-07-23 DIAGNOSIS — M549 Dorsalgia, unspecified: Secondary | ICD-10-CM | POA: Diagnosis present

## 2014-07-23 LAB — POCT I-STAT 4, (NA,K, GLUC, HGB,HCT)
Glucose, Bld: 123 mg/dL — ABNORMAL HIGH (ref 70–99)
HEMATOCRIT: 29 % — AB (ref 36.0–46.0)
Hemoglobin: 9.9 g/dL — ABNORMAL LOW (ref 12.0–15.0)
Potassium: 4.3 mmol/L (ref 3.5–5.1)
Sodium: 142 mmol/L (ref 135–145)

## 2014-07-23 SURGERY — POSTERIOR LUMBAR FUSION 2 LEVEL
Anesthesia: General | Site: Back

## 2014-07-23 MED ORDER — TRAMADOL HCL 50 MG PO TABS: 50.0000 mg | ORAL_TABLET | Freq: Three times a day (TID) | ORAL | Status: DC | PRN

## 2014-07-23 MED ORDER — THROMBIN 20000 UNITS EX SOLR
CUTANEOUS | Status: DC | PRN
  Administered 2014-07-23: 10:00:00 via TOPICAL

## 2014-07-23 MED ORDER — HYDROCODONE-ACETAMINOPHEN 10-325 MG PO TABS
1.0000 | ORAL_TABLET | Freq: Every evening | ORAL | Status: DC | PRN
  Administered 2014-07-23 – 2014-07-24 (×2): 1 via ORAL
  Filled 2014-07-23 (×3): qty 1

## 2014-07-23 MED ORDER — SODIUM CHLORIDE 0.9 % IV SOLN: 250.0000 mL | INTRAVENOUS | Status: DC

## 2014-07-23 MED ORDER — ONDANSETRON HCL 4 MG/2ML IJ SOLN
4.0000 mg | INTRAMUSCULAR | Status: DC | PRN
  Filled 2014-07-23: qty 2

## 2014-07-23 MED ORDER — HYDROMORPHONE HCL 1 MG/ML IJ SOLN
INTRAMUSCULAR | Status: AC
  Administered 2014-07-23: 0.5 mg via INTRAVENOUS
  Filled 2014-07-23: qty 1

## 2014-07-23 MED ORDER — ATORVASTATIN CALCIUM 10 MG PO TABS
20.0000 mg | ORAL_TABLET | Freq: Every day | ORAL | Status: DC
  Administered 2014-07-23 – 2014-07-26 (×4): 20 mg via ORAL
  Filled 2014-07-23 (×4): qty 2

## 2014-07-23 MED ORDER — 0.9 % SODIUM CHLORIDE (POUR BTL) OPTIME
TOPICAL | Status: DC | PRN
  Administered 2014-07-23: 1000 mL

## 2014-07-23 MED ORDER — FENTANYL CITRATE 0.05 MG/ML IJ SOLN
INTRAMUSCULAR | Status: AC
  Filled 2014-07-23: qty 5

## 2014-07-23 MED ORDER — VECURONIUM BROMIDE 10 MG IV SOLR
INTRAVENOUS | Status: DC | PRN
  Administered 2014-07-23 (×3): 2 mg via INTRAVENOUS
  Administered 2014-07-23: 1 mg via INTRAVENOUS
  Administered 2014-07-23 (×2): 2 mg via INTRAVENOUS

## 2014-07-23 MED ORDER — ROCURONIUM BROMIDE 100 MG/10ML IV SOLN
INTRAVENOUS | Status: DC | PRN
  Administered 2014-07-23: 50 mg via INTRAVENOUS

## 2014-07-23 MED ORDER — SODIUM CHLORIDE 0.9 % IJ SOLN: 3.0000 mL | INTRAMUSCULAR | Status: DC | PRN

## 2014-07-23 MED ORDER — SCOPOLAMINE 1 MG/3DAYS TD PT72
MEDICATED_PATCH | TRANSDERMAL | Status: AC
Start: 2014-07-23 — End: 2014-07-23
  Administered 2014-07-23: 1.5 mg
  Filled 2014-07-23: qty 1

## 2014-07-23 MED ORDER — PHENYLEPHRINE HCL 10 MG/ML IJ SOLN
10.0000 mg | INTRAVENOUS | Status: DC | PRN
  Administered 2014-07-23: 10 ug/min via INTRAVENOUS

## 2014-07-23 MED ORDER — LACTATED RINGERS IV SOLN
INTRAVENOUS | Status: DC | PRN
  Administered 2014-07-23 (×2): via INTRAVENOUS

## 2014-07-23 MED ORDER — MENTHOL 3 MG MT LOZG: 1.0000 | LOZENGE | OROMUCOSAL | Status: DC | PRN

## 2014-07-23 MED ORDER — SENNA 8.6 MG PO TABS
1.0000 | ORAL_TABLET | Freq: Two times a day (BID) | ORAL | Status: DC
  Administered 2014-07-23 – 2014-07-26 (×6): 8.6 mg via ORAL
  Filled 2014-07-23 (×6): qty 1

## 2014-07-23 MED ORDER — SCOPOLAMINE 1 MG/3DAYS TD PT72
1.0000 | MEDICATED_PATCH | Freq: Once | TRANSDERMAL | Status: DC
  Filled 2014-07-23: qty 1

## 2014-07-23 MED ORDER — KETOROLAC TROMETHAMINE 15 MG/ML IJ SOLN
15.0000 mg | Freq: Four times a day (QID) | INTRAMUSCULAR | Status: AC
  Administered 2014-07-23 – 2014-07-24 (×5): 15 mg via INTRAVENOUS
  Filled 2014-07-23 (×7): qty 1

## 2014-07-23 MED ORDER — THROMBIN 5000 UNITS EX SOLR
OROMUCOSAL | Status: DC | PRN
  Administered 2014-07-23: 10:00:00 via TOPICAL
  Administered 2014-07-23: 10 mL via TOPICAL

## 2014-07-23 MED ORDER — BUPIVACAINE HCL (PF) 0.5 % IJ SOLN
INTRAMUSCULAR | Status: DC | PRN
  Administered 2014-07-23: 10 mL
  Administered 2014-07-23: 20 mL

## 2014-07-23 MED ORDER — ACETAMINOPHEN 650 MG RE SUPP: 650.0000 mg | RECTAL | Status: DC | PRN

## 2014-07-23 MED ORDER — MIDAZOLAM HCL 2 MG/2ML IJ SOLN
INTRAMUSCULAR | Status: AC
  Filled 2014-07-23: qty 2

## 2014-07-23 MED ORDER — KETOROLAC TROMETHAMINE 15 MG/ML IJ SOLN
INTRAMUSCULAR | Status: AC
  Administered 2014-07-23: 15 mg via INTRAVENOUS
  Filled 2014-07-23: qty 1

## 2014-07-23 MED ORDER — GABAPENTIN 300 MG PO CAPS
300.0000 mg | ORAL_CAPSULE | Freq: Three times a day (TID) | ORAL | Status: DC
  Administered 2014-07-23 – 2014-07-26 (×9): 300 mg via ORAL
  Filled 2014-07-23 (×9): qty 1

## 2014-07-23 MED ORDER — SODIUM CHLORIDE 0.9 % IV SOLN
INTRAVENOUS | Status: DC | PRN
  Administered 2014-07-23 (×2): via INTRAVENOUS

## 2014-07-23 MED ORDER — PROPOFOL 10 MG/ML IV BOLUS
INTRAVENOUS | Status: AC
  Filled 2014-07-23: qty 20

## 2014-07-23 MED ORDER — ONDANSETRON HCL 4 MG/2ML IJ SOLN
INTRAMUSCULAR | Status: DC | PRN
  Administered 2014-07-23: 4 mg via INTRAVENOUS

## 2014-07-23 MED ORDER — FLEET ENEMA 7-19 GM/118ML RE ENEM: 1.0000 | ENEMA | Freq: Once | RECTAL | Status: AC | PRN

## 2014-07-23 MED ORDER — GLYCOPYRROLATE 0.2 MG/ML IJ SOLN
INTRAMUSCULAR | Status: DC | PRN
  Administered 2014-07-23: .6 mg via INTRAVENOUS

## 2014-07-23 MED ORDER — FENTANYL CITRATE 0.05 MG/ML IJ SOLN
INTRAMUSCULAR | Status: DC | PRN
Start: 2014-07-23 — End: 2014-07-23
  Administered 2014-07-23: 150 ug via INTRAVENOUS
  Administered 2014-07-23 (×5): 50 ug via INTRAVENOUS

## 2014-07-23 MED ORDER — ONDANSETRON HCL 4 MG/2ML IJ SOLN: 4.0000 mg | Freq: Four times a day (QID) | INTRAMUSCULAR | Status: DC | PRN

## 2014-07-23 MED ORDER — SODIUM CHLORIDE 0.9 % IV SOLN
INTRAVENOUS | Status: DC
  Administered 2014-07-23: 18:00:00 via INTRAVENOUS

## 2014-07-23 MED ORDER — MIDAZOLAM HCL 5 MG/5ML IJ SOLN
INTRAMUSCULAR | Status: DC | PRN
Start: 2014-07-23 — End: 2014-07-23
  Administered 2014-07-23: 2 mg via INTRAVENOUS

## 2014-07-23 MED ORDER — HYDROMORPHONE HCL 1 MG/ML IJ SOLN
INTRAMUSCULAR | Status: AC
  Filled 2014-07-23: qty 1

## 2014-07-23 MED ORDER — ALUM & MAG HYDROXIDE-SIMETH 200-200-20 MG/5ML PO SUSP: 30.0000 mL | Freq: Four times a day (QID) | ORAL | Status: DC | PRN

## 2014-07-23 MED ORDER — PROPOFOL 10 MG/ML IV BOLUS
INTRAVENOUS | Status: DC | PRN
  Administered 2014-07-23: 150 mg via INTRAVENOUS

## 2014-07-23 MED ORDER — BISACODYL 10 MG RE SUPP: 10.0000 mg | Freq: Every day | RECTAL | Status: DC | PRN

## 2014-07-23 MED ORDER — PHENOL 1.4 % MT LIQD: 1.0000 | OROMUCOSAL | Status: DC | PRN

## 2014-07-23 MED ORDER — LIDOCAINE HCL (CARDIAC) 20 MG/ML IV SOLN
INTRAVENOUS | Status: DC | PRN
  Administered 2014-07-23: 75 mg via INTRAVENOUS

## 2014-07-23 MED ORDER — OXYCODONE-ACETAMINOPHEN 5-325 MG PO TABS
1.0000 | ORAL_TABLET | ORAL | Status: DC | PRN
  Administered 2014-07-24 – 2014-07-26 (×10): 2 via ORAL
  Administered 2014-07-26: 1 via ORAL
  Filled 2014-07-23 (×12): qty 2

## 2014-07-23 MED ORDER — HYDROMORPHONE HCL 1 MG/ML IJ SOLN
0.5000 mg | INTRAMUSCULAR | Status: DC | PRN
  Administered 2014-07-23 – 2014-07-24 (×3): 1 mg via INTRAVENOUS
  Administered 2014-07-25: 0.5 mg via INTRAVENOUS
  Administered 2014-07-25: 1 mg via INTRAVENOUS
  Administered 2014-07-26: 0.5 mg via INTRAVENOUS
  Filled 2014-07-23 (×6): qty 1

## 2014-07-23 MED ORDER — METHOCARBAMOL 500 MG PO TABS
500.0000 mg | ORAL_TABLET | Freq: Four times a day (QID) | ORAL | Status: DC | PRN
  Administered 2014-07-24 – 2014-07-26 (×4): 500 mg via ORAL
  Filled 2014-07-23 (×5): qty 1

## 2014-07-23 MED ORDER — SODIUM CHLORIDE 0.9 % IJ SOLN
3.0000 mL | Freq: Two times a day (BID) | INTRAMUSCULAR | Status: DC
  Administered 2014-07-25: 3 mL via INTRAVENOUS

## 2014-07-23 MED ORDER — CEFAZOLIN SODIUM 1-5 GM-% IV SOLN
1.0000 g | Freq: Three times a day (TID) | INTRAVENOUS | Status: AC
  Administered 2014-07-23 – 2014-07-24 (×2): 1 g via INTRAVENOUS
  Filled 2014-07-23 (×2): qty 50

## 2014-07-23 MED ORDER — DOCUSATE SODIUM 100 MG PO CAPS
100.0000 mg | ORAL_CAPSULE | Freq: Two times a day (BID) | ORAL | Status: DC
  Administered 2014-07-23 – 2014-07-26 (×6): 100 mg via ORAL
  Filled 2014-07-23 (×6): qty 1

## 2014-07-23 MED ORDER — DEXAMETHASONE 4 MG PO TABS
4.0000 mg | ORAL_TABLET | Freq: Two times a day (BID) | ORAL | Status: DC
  Administered 2014-07-23 – 2014-07-26 (×6): 4 mg via ORAL
  Filled 2014-07-23 (×6): qty 1

## 2014-07-23 MED ORDER — FAMOTIDINE 20 MG PO TABS
20.0000 mg | ORAL_TABLET | Freq: Two times a day (BID) | ORAL | Status: DC
  Administered 2014-07-23 – 2014-07-26 (×6): 20 mg via ORAL
  Filled 2014-07-23 (×6): qty 1

## 2014-07-23 MED ORDER — TOBRAMYCIN-DEXAMETHASONE 0.3-0.1 % OP SUSP
2.0000 [drp] | OPHTHALMIC | Status: DC
  Administered 2014-07-23 – 2014-07-26 (×15): 2 [drp] via OPHTHALMIC
  Filled 2014-07-23: qty 2.5

## 2014-07-23 MED ORDER — KETOROLAC TROMETHAMINE 0.5 % OP SOLN
1.0000 [drp] | Freq: Three times a day (TID) | OPHTHALMIC | Status: AC
  Administered 2014-07-23 – 2014-07-24 (×3): 1 [drp] via OPHTHALMIC
  Filled 2014-07-23: qty 3

## 2014-07-23 MED ORDER — METHOCARBAMOL 1000 MG/10ML IJ SOLN
500.0000 mg | Freq: Four times a day (QID) | INTRAVENOUS | Status: DC | PRN
  Administered 2014-07-23: 500 mg via INTRAVENOUS
  Filled 2014-07-23 (×3): qty 5

## 2014-07-23 MED ORDER — HYDROMORPHONE HCL 1 MG/ML IJ SOLN
0.2500 mg | INTRAMUSCULAR | Status: DC | PRN
  Administered 2014-07-23 (×4): 0.5 mg via INTRAVENOUS

## 2014-07-23 MED ORDER — ACETAMINOPHEN 325 MG PO TABS: 650.0000 mg | ORAL_TABLET | ORAL | Status: DC | PRN

## 2014-07-23 MED ORDER — DEXAMETHASONE SODIUM PHOSPHATE 10 MG/ML IJ SOLN
INTRAMUSCULAR | Status: DC | PRN
  Administered 2014-07-23: 10 mg via INTRAVENOUS

## 2014-07-23 MED ORDER — LACTATED RINGERS IV SOLN
INTRAVENOUS | Status: DC
  Administered 2014-07-23: 07:00:00 via INTRAVENOUS

## 2014-07-23 MED ORDER — EPHEDRINE SULFATE 50 MG/ML IJ SOLN
INTRAMUSCULAR | Status: DC | PRN
  Administered 2014-07-23 (×3): 5 mg via INTRAVENOUS

## 2014-07-23 MED ORDER — PHENYLEPHRINE HCL 10 MG/ML IJ SOLN
INTRAMUSCULAR | Status: DC | PRN
Start: 2014-07-23 — End: 2014-07-23
  Administered 2014-07-23: 40 ug via INTRAVENOUS
  Administered 2014-07-23: 80 ug via INTRAVENOUS
  Administered 2014-07-23 (×3): 40 ug via INTRAVENOUS

## 2014-07-23 MED ORDER — NEOSTIGMINE METHYLSULFATE 10 MG/10ML IV SOLN
INTRAVENOUS | Status: DC | PRN
  Administered 2014-07-23: 4 mg via INTRAVENOUS

## 2014-07-23 MED ORDER — POLYETHYLENE GLYCOL 3350 17 G PO PACK: 17.0000 g | PACK | Freq: Every day | ORAL | Status: DC | PRN

## 2014-07-23 MED ORDER — LIDOCAINE-EPINEPHRINE 1 %-1:100000 IJ SOLN
INTRAMUSCULAR | Status: DC | PRN
  Administered 2014-07-23: 10 mL

## 2014-07-23 MED ORDER — ALBUMIN HUMAN 5 % IV SOLN
INTRAVENOUS | Status: DC | PRN
  Administered 2014-07-23 (×2): via INTRAVENOUS

## 2014-07-23 MED ORDER — LISINOPRIL 20 MG PO TABS
20.0000 mg | ORAL_TABLET | Freq: Every day | ORAL | Status: DC
  Administered 2014-07-26: 20 mg via ORAL
  Filled 2014-07-23 (×4): qty 1

## 2014-07-23 MED ORDER — SODIUM CHLORIDE 0.9 % IR SOLN
Status: DC | PRN
  Administered 2014-07-23: 10:00:00

## 2014-07-23 SURGICAL SUPPLY — 74 items
BAG DECANTER FOR FLEXI CONT (MISCELLANEOUS) ×3 IMPLANT
BLADE CLIPPER SURG (BLADE) IMPLANT
BUR MATCHSTICK NEURO 3.0 LAGG (BURR) ×3 IMPLANT
CAGE COROENT LRG 8X9X28M SPINE (Cage) ×12 IMPLANT
CANISTER SUCT 3000ML (MISCELLANEOUS) ×3 IMPLANT
CONT SPEC 4OZ CLIKSEAL STRL BL (MISCELLANEOUS) ×6 IMPLANT
COVER BACK TABLE 60X90IN (DRAPES) ×3 IMPLANT
DECANTER SPIKE VIAL GLASS SM (MISCELLANEOUS) ×3 IMPLANT
DRAPE C-ARM 42X72 X-RAY (DRAPES) ×6 IMPLANT
DRAPE LAPAROTOMY 100X72X124 (DRAPES) ×3 IMPLANT
DRAPE POUCH INSTRU U-SHP 10X18 (DRAPES) ×3 IMPLANT
DRAPE PROXIMA HALF (DRAPES) IMPLANT
DRSG OPSITE POSTOP 4X6 (GAUZE/BANDAGES/DRESSINGS) IMPLANT
DRSG OPSITE POSTOP 4X8 (GAUZE/BANDAGES/DRESSINGS) ×3 IMPLANT
DURAPREP 26ML APPLICATOR (WOUND CARE) ×3 IMPLANT
ELECT REM PT RETURN 9FT ADLT (ELECTROSURGICAL) ×3
ELECTRODE REM PT RTRN 9FT ADLT (ELECTROSURGICAL) ×1 IMPLANT
GAUZE SPONGE 4X4 12PLY STRL (GAUZE/BANDAGES/DRESSINGS) ×3 IMPLANT
GAUZE SPONGE 4X4 16PLY XRAY LF (GAUZE/BANDAGES/DRESSINGS) IMPLANT
GLOVE BIO SURGEON STRL SZ8.5 (GLOVE) ×3 IMPLANT
GLOVE BIOGEL PI IND STRL 7.0 (GLOVE) ×1 IMPLANT
GLOVE BIOGEL PI IND STRL 7.5 (GLOVE) ×1 IMPLANT
GLOVE BIOGEL PI IND STRL 8.5 (GLOVE) ×2 IMPLANT
GLOVE BIOGEL PI INDICATOR 7.0 (GLOVE) ×2
GLOVE BIOGEL PI INDICATOR 7.5 (GLOVE) ×2
GLOVE BIOGEL PI INDICATOR 8.5 (GLOVE) ×4
GLOVE ECLIPSE 6.5 STRL STRAW (GLOVE) ×9 IMPLANT
GLOVE ECLIPSE 7.5 STRL STRAW (GLOVE) ×6 IMPLANT
GLOVE ECLIPSE 8.5 STRL (GLOVE) ×9 IMPLANT
GLOVE EXAM NITRILE LRG STRL (GLOVE) IMPLANT
GLOVE EXAM NITRILE MD LF STRL (GLOVE) IMPLANT
GLOVE EXAM NITRILE XL STR (GLOVE) IMPLANT
GLOVE EXAM NITRILE XS STR PU (GLOVE) IMPLANT
GLOVE OPTIFIT SS 8.0 STRL (GLOVE) ×3 IMPLANT
GOWN STRL REUS W/ TWL LRG LVL3 (GOWN DISPOSABLE) ×2 IMPLANT
GOWN STRL REUS W/ TWL XL LVL3 (GOWN DISPOSABLE) ×1 IMPLANT
GOWN STRL REUS W/TWL 2XL LVL3 (GOWN DISPOSABLE) ×6 IMPLANT
GOWN STRL REUS W/TWL LRG LVL3 (GOWN DISPOSABLE) ×4
GOWN STRL REUS W/TWL XL LVL3 (GOWN DISPOSABLE) ×2
GRAFT DURAGEN MATRIX 1WX1L (Tissue) ×3 IMPLANT
HEMOSTAT POWDER KIT SURGIFOAM (HEMOSTASIS) ×9 IMPLANT
HEMOSTAT POWDER SURGIFOAM 1G (HEMOSTASIS) ×6 IMPLANT
KIT BASIN OR (CUSTOM PROCEDURE TRAY) ×3 IMPLANT
KIT INFUSE MEDIUM (Orthopedic Implant) ×3 IMPLANT
KIT ROOM TURNOVER OR (KITS) ×3 IMPLANT
LIQUID BAND (GAUZE/BANDAGES/DRESSINGS) ×3 IMPLANT
MILL MEDIUM DISP (BLADE) ×3 IMPLANT
NEEDLE HYPO 22GX1.5 SAFETY (NEEDLE) ×3 IMPLANT
NEEDLE SPNL 18GX3.5 QUINCKE PK (NEEDLE) ×3 IMPLANT
NS IRRIG 1000ML POUR BTL (IV SOLUTION) ×3 IMPLANT
PACK FOAM VITOSS 10CC (Orthopedic Implant) ×3 IMPLANT
PACK LAMINECTOMY NEURO (CUSTOM PROCEDURE TRAY) ×3 IMPLANT
PAD ARMBOARD 7.5X6 YLW CONV (MISCELLANEOUS) ×9 IMPLANT
PATTIES SURGICAL .5 X.5 (GAUZE/BANDAGES/DRESSINGS) ×3 IMPLANT
PATTIES SURGICAL .5 X1 (DISPOSABLE) ×3 IMPLANT
PATTIES SURGICAL 1X1 (DISPOSABLE) ×3 IMPLANT
ROD RELINE TI LORD 5.5X70 (Rod) ×6 IMPLANT
SCREW LOCK RELINE 5.5 TULIP (Screw) ×18 IMPLANT
SCREW RELINE-O POLY 6.5X45 (Screw) ×18 IMPLANT
SPONGE LAP 4X18 X RAY DECT (DISPOSABLE) ×3 IMPLANT
SPONGE SURGIFOAM ABS GEL 100 (HEMOSTASIS) ×3 IMPLANT
SUT PROLENE 6 0 BV (SUTURE) ×3 IMPLANT
SUT VIC AB 1 CT1 18XBRD ANBCTR (SUTURE) ×2 IMPLANT
SUT VIC AB 1 CT1 8-18 (SUTURE) ×4
SUT VIC AB 2-0 CP2 18 (SUTURE) ×6 IMPLANT
SUT VIC AB 3-0 SH 8-18 (SUTURE) ×6 IMPLANT
SYR 20ML ECCENTRIC (SYRINGE) ×3 IMPLANT
SYR 3ML LL SCALE MARK (SYRINGE) ×12 IMPLANT
SYR 5ML LL (SYRINGE) IMPLANT
TOWEL OR 17X24 6PK STRL BLUE (TOWEL DISPOSABLE) ×3 IMPLANT
TOWEL OR 17X26 10 PK STRL BLUE (TOWEL DISPOSABLE) ×3 IMPLANT
TRAP SPECIMEN MUCOUS 40CC (MISCELLANEOUS) ×3 IMPLANT
TRAY FOLEY CATH 14FRSI W/METER (CATHETERS) ×3 IMPLANT
WATER STERILE IRR 1000ML POUR (IV SOLUTION) ×3 IMPLANT

## 2014-07-23 NOTE — Anesthesia Procedure Notes (Signed)
Procedure Name: Intubation Date/Time: 07/23/2014 10:14 AM Performed by: Adonis HousekeeperNGELL,  M Pre-anesthesia Checklist: Patient identified, Emergency Drugs available, Suction available and Patient being monitored Patient Re-evaluated:Patient Re-evaluated prior to inductionOxygen Delivery Method: Circle system utilized Preoxygenation: Pre-oxygenation with 100% oxygen Intubation Type: IV induction Ventilation: Mask ventilation without difficulty Laryngoscope Size: Mac and 3 Grade View: Grade II Tube type: Oral Tube size: 7.0 mm Number of attempts: 1 Airway Equipment and Method: Stylet Placement Confirmation: ETT inserted through vocal cords under direct vision,  positive ETCO2 and breath sounds checked- equal and bilateral Secured at: 22 cm Tube secured with: Tape Dental Injury: Teeth and Oropharynx as per pre-operative assessment

## 2014-07-23 NOTE — Addendum Note (Signed)
Addendum  created 07/23/14 2045 by Laverle HobbyGregory , MD   Modules edited: Clinical Notes   Clinical Notes:  File: [id number]; Pend: [id number]

## 2014-07-23 NOTE — Consult Note (Signed)
Contacted by Neuro surg for evaluation of post op corneal abrasion symptoms in above pt. Pt states eye irritated this evening . States hard to open because of irritation and watering. Sight appears to be ok. No other c/o.  Will rx with Acular drops for pain if not allergic and antibiotic drops per Pharmacy. Follow up with pt in am.  GES

## 2014-07-23 NOTE — Progress Notes (Signed)
[facility] assuming care of patient at this time

## 2014-07-23 NOTE — H&P (Signed)
Kathy Coleman is an 59 y.o. female.   Chief Complaint: Back and bilateral leg pain HPI: Kathy Coleman is a 59 year old right-handed individual whom I have known for the past 20 years she initially started with a spondylolisthesis at L5-S1 which required surgical decompression and fusion which was performed in 1996 with a Ray cage technique. She did well for a number of years but then developed degenerative changes at L4-5 which was decompressed and fused using a pedicle screw technique with posterior lumbar interbody arthrodesis. She again did well for about for 5 years but has recently developed increasing pain in her back with pain radiating into both lower extremities in her buttocks she has been advised now regarding surgical decompression and stabilization at L2-3 and L3-4 has both these levels above her previous arthrodesis have shown degenerative changes with degenerative listhesis of a few millimeters. Despite efforts at conservative management including multiple steroid injections facet blocks physical therapy activity modification she finds that she is having difficulty with even activities of daily living.  Past Medical History  Diagnosis Date  . PONV (postoperative nausea and vomiting)   . Hypertension   . Arthritis     Past Surgical History  Procedure Laterality Date  . Foot surgery      RIGHT  2014  . Cholecystectomy    . Abdominal hysterectomy    . Back surgery      1998  , 2010  LOW BACK    . Neck surgery      2005, 2010    History reviewed. No pertinent family history. Social History:  reports that she has quit smoking. She does not have any smokeless tobacco history on file. She reports that she does not drink alcohol or use illicit drugs.  Allergies: No Known Allergies  Medications Prior to Admission  Medication Sig Dispense Refill  . atorvastatin (LIPITOR) 20 MG tablet Take 20 mg by mouth daily.    Marland Kitchen. gabapentin (NEURONTIN) 300 MG capsule Take 300 mg by mouth 3 (three)  times daily.    Marland Kitchen. HYDROcodone-acetaminophen (NORCO) 10-325 MG per tablet Take 1 tablet by mouth at bedtime as needed.    Marland Kitchen. lisinopril (PRINIVIL,ZESTRIL) 20 MG tablet Take 20 mg by mouth daily.    . meloxicam (MOBIC) 15 MG tablet Take 15 mg by mouth daily.    . ranitidine (ZANTAC) 150 MG tablet Take 150 mg by mouth at bedtime as needed for heartburn.     . traMADol (ULTRAM) 50 MG tablet Take 50-100 mg by mouth every 8 (eight) hours as needed.      No results found for this or any previous visit (from the past 48 hour(s)). No results found.  Review of Systems  Constitutional:       Weakness in legs  HENT: Negative.   Eyes: Negative.   Respiratory: Negative.   Cardiovascular: Negative.   Gastrointestinal: Negative.   Genitourinary: Negative.   Musculoskeletal: Positive for back pain.       Status post decompression arthrodesis L4-5 and L5-S1 in the past 15 years  Skin: Negative.   Neurological: Positive for sensory change, focal weakness and weakness.       Bilateral back and leg pain when standing for more than 10 minutes. Chronic back pain even with recumbency    Blood pressure 149/72, pulse 58, temperature 97.7 F (36.5 C), temperature source Oral, height 5\' 6"  (1.676 m), weight 81.647 kg (180 lb), SpO2 98 %. Physical Exam  Constitutional: She is oriented to person,  place, and time. She appears well-developed and well-nourished.  HENT:  Head: Normocephalic and atraumatic.  Eyes: Conjunctivae and EOM are normal. Pupils are equal, round, and reactive to light.  Neck: Normal range of motion. Neck supple.  Cardiovascular: Normal rate and regular rhythm.   Respiratory: Effort normal and breath sounds normal.  GI: Soft. Bowel sounds are normal.  Musculoskeletal: Normal range of motion.  Moderate limitation in flexion and extension with exacerbation of back pain with extension  Neurological: She is alert and oriented to person, place, and time.  Good strength in iliopsoas quadriceps  tibialis anterior and gastrocs with absent reflexes in the patellae and the Achilles both  Skin: Skin is warm and dry.  Psychiatric: She has a normal mood and affect. Her behavior is normal. Judgment and thought content normal.     Assessment/Plan Spondylosis and stenosis with degenerative listhesis L2-3 and L3-4, lumbar radiculopathy.  Laminectomy L2-3 and L3 for posterior lumbar interbody arthrodesis with pedicle screw fixation L2-L4.  , J 07/23/2014, 9:35 AM

## 2014-07-23 NOTE — Progress Notes (Signed)
Pt is admitted to room 4N06 from PACU. Admission vitals are stable

## 2014-07-23 NOTE — Anesthesia Preprocedure Evaluation (Addendum)
Anesthesia Evaluation  Patient identified by MRN, date of birth, ID band  Reviewed: Allergy & Precautions, NPO status , Patient's Chart, lab work & pertinent test results  History of Anesthesia Complications (+) PONV  Airway Mallampati: II   Neck ROM: full    Dental  (+) Dental Advisory Given   Pulmonary former smoker,    Pulmonary exam normal       Cardiovascular hypertension,     Neuro/Psych    GI/Hepatic   Endo/Other    Renal/GU      Musculoskeletal  (+) Arthritis -, Pain in back and more in right leg   Abdominal   Peds  Hematology   Anesthesia Other Findings   Reproductive/Obstetrics                            Anesthesia Physical Anesthesia Plan  ASA: II  Anesthesia Plan: General   Post-op Pain Management:    Induction: Intravenous  Airway Management Planned: Oral ETT  Additional Equipment:   Intra-op Plan:   Post-operative Plan: Extubation in OR  Informed Consent: I have reviewed the patients History and Physical, chart, labs and discussed the procedure including the risks, benefits and alternatives for the proposed anesthesia with the patient or authorized representative who has indicated his/her understanding and acceptance.     Plan Discussed with: CRNA, Anesthesiologist and Surgeon  Anesthesia Plan Comments:         Anesthesia Quick Evaluation

## 2014-07-23 NOTE — Op Note (Signed)
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 Status post decompression arthrodesis L4 to sacrum Postoperative diagnosis: Spondylosis and stenosis with radiculopathy and neurogenic claudication L2-3, L3-4. Status post decompression and arthrodesis L4 to sacrum Procedure: Decompression of L2-3 and L3-4 with laminectomy of L2 to and L3, undercutting of L4. Decompression of L2-L3 and L4 nerve roots bilaterally with more work then required for interbody arthrodesis, posterior lumbar interbody arthrodesis with peek spacers local autograft and infuse L2-3 and L3-4. Segmental fixation L2-L4 with pedicle screws posterior lateral arthrodesis with autograft and allograft and infuse L2-L4. Surgeon: Kathy Coleman  First assistant: Kathy Coleman M.D. Anesthesia: Gen. endotracheal Indications: Kathy Coleman is a 59 year old individual who's had extensive surgery on her lumbar spine in the past. Previously I did a decompression and fusion at L5-S1 secondary to spondylolisthesis and her number of years later she underwent spondylitic degeneration of L4-L5 and required surgical decompression and arthrodesis there area she is now experiencing increasing pain in the back and in the legs over a couple of years. A time. This was initially treated conservatively but recently it's become increasingly refractory and she has progressive on boluses and stenosis that is involved at both L2-3 and L3-4. An arthrodesis is now being performed.  Procedure: The patient was brought to the operating room supine on a stretcher. After the smooth induction of general endotracheal anesthesia, she was turned prone. The back was prepped with alcohol DuraPrep. A midline incision was created using part of the old incision inferiorly and extending the superiorly approximately 3 cm. The dissection was carried down to the lumbar dorsal fascia. A subperiosteal  dissection was performed in the lower spinous processes to expose these. Then by dissecting further laterally and inferiorly, identified the superior portion of the L4 screws. The L4-L5 region was dissected to expose the hardware. The hardware was then loosened and removed. Surgifoam was placed into the bone screws to help control hemostasis. The dissection was then carried cephalad to expose the L3 and L2 spinous processes laminar arch is an interlaminar spaces. The pars region of L2 was also exposed the dissection was carried out to expose transverse process of L2 and L3 and L4. These areas were decorticated and packed off for later usage. Then laminectomy was created removing the entire laminar arch of L3 and the lamina and facet complex of L2 to expose the superior articular processes of the L3 vertebrae. The yellow ligament was taken up. The common dural tube was exposed and then the path of the L2 nerve root superiorly was decompressed of a significant amount of hypertrophied ligamentous material in addition to the superior articular processes of L3. These were causing stenosis for the lateral recess of L2. This was decompressed bilaterally. Then the L5 III nerve roots were decompressed in a similar fashion removing the superior articular processes of L4. The L4 nerve roots were decompressed inferiorly by removing a portion of the superior arch of the L4 lamina. Once the decompression was completed the disc spaces were isolated. At L3-4 the disc space was entered with a 15 blade was noted to be a significant retrolisthesis. By using a series of disc space cleaners were able to dilate the interspace and remove all the degenerated disc material within it as the disc space was mobilized reduced to a neutral position. The endplates were cleaned using a combination of curettes rongeurs and punches within the disc space once all the disc space was evacuated from side to  side to displace was distracted it was sized  for appropriate size spacer. It is felt that an 8 mm x 28 mm long by 8 lordotic spacer would fit best into the interval this was filled with a combination of autograft and infuse. 3 mL of autograft was placed on each side of the interspace along with a small piece of infuse and then the spacer was placed in each side. Attention was then turned to L2-3 where a similar process was carried out in the interspace. Here also 8 mm tall by 28 mm long 8 lordotic spacers were placed into the interspace. Another 6 mL of bone and infuse was placed here also.  Pedicle entry sites were then chosen under fluoroscopic guidance 6.5 x 45 mm screws were placed and L2-L3 and 6.5 x 45 mm screws were placed in the previously made holes in L4. 70 mm precontoured rods were then used to connect the screw heads together. This was done neutral construct. Once the construct was obtained final radiographs are obtained in AP and lateral projection. Lateral gutters which had previously been decorticated were then packed with remainder of autograft and 2 infuse strips from a medium-size infuse package. The autograft was a bit sparse and this was supplemented with 10 mL of the cost bone sponge which was packed into the lateral gutters over the transverse processes of L2-L4. Then the dura was carefully inspected. There is noted to be a low-grade spinal fluid leak from a number of small shreds in the dura were the laminar arch of L3 appeared to have been scarred to the dura. These were not individual holes but several areas of shred that allowed spinal fluid to escape drop eyes fashion. This was patched with a piece of DuraGen sewn in all 4 corners with 6-0 Prolene maintain its position. 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 In the end hemostasis and the soft tissues obtained meticulously, and then the lumbar dorsal fascia was closed with  #1 Vicryl in interrupted fashion. 20 mL of half percent Marcaine was injected into the paraspinous fascia. The subcutaneous tissue was closed with 2-0 Vicryl in interrupted fashion and 3-0 Vicryl was used to close the subcutaneous take her skin. Blood loss is estimated at 1200 mL, 400 cc of Cell Saver blood was returned to the patient.

## 2014-07-23 NOTE — Addendum Note (Signed)
Addendum  created 07/23/14 2013 by Minus LibertyBritney , CRNA   Modules edited: Anesthesia Events

## 2014-07-23 NOTE — Transfer of Care (Signed)
Immediate Anesthesia Transfer of Care Note  Patient: Kathy Coleman  Procedure(s) Performed: Procedure(s): Lumbar two-three,lumbar three-four Posterior lumbar interbody fusion (N/A)  Patient Location: PACU  Anesthesia Type:General  Level of Consciousness: awake, alert  and oriented  Airway & Oxygen Therapy: Patient Spontanous Breathing  Post-op Assessment: Report given to PACU RN and Post -op Vital signs reviewed and stable  Post vital signs: Reviewed and stable  Complications: No apparent anesthesia complications

## 2014-07-23 NOTE — Anesthesia Postprocedure Evaluation (Signed)
  Anesthesia Post-op Note  Patient: Kathy Coleman  Procedure(s) Performed: Procedure(s): Lumbar two-three,lumbar three-four Posterior lumbar interbody fusion (N/A)  Patient Location: PACU  Anesthesia Type:General  Level of Consciousness: awake and alert   Airway and Oxygen Therapy: Patient Spontanous Breathing  Post-op Pain: mild  Post-op Assessment: Post-op Vital signs reviewed  Post-op Vital Signs: stable  Last Vitals:  Filed Vitals:   07/23/14 1700  BP: 106/64  Pulse: 80  Temp:   Resp: 12    Complications: No apparent anesthesia complications

## 2014-07-23 NOTE — Progress Notes (Signed)
Patient ID: Kathy MediciMyra L Coleman, female   DOB: 10-Jun-1956, 59 y.o.   MRN: [id number] Vital signs are stable Motor function is good in lower extremities Moderate amount of back pain currently For transfer to floor when stable

## 2014-07-24 DIAGNOSIS — D62 Acute posthemorrhagic anemia: Secondary | ICD-10-CM | POA: Diagnosis not present

## 2014-07-24 LAB — BASIC METABOLIC PANEL
Anion gap: 5 (ref 5–15)
BUN: 18 mg/dL (ref 6–23)
CO2: 25 mmol/L (ref 19–32)
Calcium: 7.6 mg/dL — ABNORMAL LOW (ref 8.4–10.5)
Chloride: 106 mEq/L (ref 96–112)
Creatinine, Ser: 1.34 mg/dL — ABNORMAL HIGH (ref 0.50–1.10)
GFR calc Af Amer: 49 mL/min — ABNORMAL LOW (ref >90)
GFR, EST NON AFRICAN AMERICAN: 42 mL/min — AB (ref >90)
Glucose, Bld: 143 mg/dL — ABNORMAL HIGH (ref 70–99)
Potassium: 5 mmol/L (ref 3.5–5.1)
SODIUM: 136 mmol/L (ref 135–145)

## 2014-07-24 LAB — CBC
HEMATOCRIT: 24.8 % — AB (ref 36.0–46.0)
HEMOGLOBIN: 8.3 g/dL — AB (ref 12.0–15.0)
MCH: 30.5 pg (ref 26.0–34.0)
MCHC: 33.5 g/dL (ref 30.0–36.0)
MCV: 91.2 fL (ref 78.0–100.0)
Platelets: 141 10*3/uL — ABNORMAL LOW (ref 150–400)
RBC: 2.72 MIL/uL — ABNORMAL LOW (ref 3.87–5.11)
RDW: 12.4 % (ref 11.5–15.5)
WBC: 8.4 10*3/uL (ref 4.0–10.5)

## 2014-07-24 LAB — PREPARE RBC (CROSSMATCH)

## 2014-07-24 MED ORDER — SODIUM CHLORIDE 0.9 % IV SOLN
Freq: Once | INTRAVENOUS | Status: AC
  Administered 2014-07-24: 17:00:00 via INTRAVENOUS

## 2014-07-24 MED FILL — Sodium Chloride IV Soln 0.9%: INTRAVENOUS | Qty: 1000 | Status: AC

## 2014-07-24 MED FILL — Sodium Chloride Irrigation Soln 0.9%: Qty: 3000 | Status: AC

## 2014-07-24 MED FILL — Heparin Sodium (Porcine) Inj 1000 Unit/ML: INTRAMUSCULAR | Qty: 30 | Status: AC

## 2014-07-24 NOTE — Evaluation (Signed)
Physical Therapy Evaluation Patient Details Name: Kathy Coleman MRN: [id number] DOB: 06/11/1956 Today's Date: 07/24/2014   History of Present Illness  Adm 07/23/14 for L2-4 decompression and fusion PMHx- back surgery x3, neck surgery x2, OA, HTN  Clinical Impression  Patient is s/p above surgery resulting in the deficits listed below (see PT Problem List). Despite prior back surgeries, pt unable to recall back precautions and requires cues to maintain proper techniques during mobility. Patient will benefit from skilled PT to increase their independence and safety with mobility (while adhering to their precautions) to allow discharge to the venue listed below.     Follow Up Recommendations No PT follow up;Supervision for mobility/OOB    Equipment Recommendations  None recommended by PT    Recommendations for Other Services OT consult     Precautions / Restrictions Precautions Precautions: Fall;Back Precaution Booklet Issued: Yes (comment) Precaution Comments: pt with appropriate questions related to back precautions Required Braces or Orthoses: Spinal Brace Spinal Brace: Lumbar corset;Applied in sitting position Restrictions Weight Bearing Restrictions: No      Mobility  Bed Mobility Overal bed mobility: Needs Assistance Bed Mobility: Rolling;Sidelying to Sit Rolling: Supervision Sidelying to sit: Min assist   Sit to supine: Supervision   General bed mobility comments: HOB elevated with rail (pt has hospital bed at home); vc to maintain back precautions and assist to move legs off EOB  Transfers Overall transfer level: Needs assistance Equipment used: Rolling walker (2 wheeled) Transfers: Sit to/from Stand Sit to Stand: Min guard         General transfer comment: vc for safe use of DME and maintain back precuations  Ambulation/Gait Ambulation/Gait assistance: Min guard Ambulation Distance (Feet): 120 Feet Assistive device: Rolling walker (2 wheeled) Gait  Pattern/deviations: Step-through pattern;Decreased stride length   Gait velocity interpretation: Below normal speed for age/gender General Gait Details: decr velocity due to pain and slight dizziness; vc for safe use of RW and upright posture  Stairs            Wheelchair Mobility    Modified Rankin (Stroke Patients Only)       Balance Overall balance assessment: Needs assistance         Standing balance support: Bilateral upper extremity supported;During functional activity Standing balance-Leahy Scale: Good                               Pertinent Vitals/Pain BP sitting after ambulation 104/70 (previously 90s/60s)  Pain Assessment: 0-10 Pain Score: 7  Pain Location: back, not RLE Pain Descriptors / Indicators: Aching Pain Intervention(s): Limited activity within patient's tolerance;Monitored during session;Premedicated before session;Repositioned    Home Living Family/patient expects to be discharged to:: Private residence Living Arrangements: Spouse/significant other Available Help at Discharge: Family;Available 24 hours/day;Friend(s) Type of Home: House Home Access: Level entry     Home Layout: One level Home Equipment: Walker - 2 wheels;Hospital bed;Shower seat;Grab bars - tub/shower;Adaptive equipment Additional Comments: Recommending sock aid    Prior Function Level of Independence: Needs assistance   Gait / Transfers Assistance Needed: stairs   ADL's / Homemaking Assistance Needed: assist with lower body dressing secondary to pain/discomfort - will benefit from acute OT for education on AE to increase independence with LB ADLs        Hand Dominance   Dominant Hand: Right    Extremity/Trunk Assessment   Upper Extremity Assessment: Overall WFL for tasks assessed  Lower Extremity Assessment: Overall WFL for tasks assessed (reports some numbness in hip/thigh as PTA)      Cervical / Trunk Assessment: Other exceptions   Communication   Communication: No difficulties  Cognition Arousal/Alertness: Awake/alert Behavior During Therapy: WFL for tasks assessed/performed Overall Cognitive Status: Within Functional Limits for tasks assessed                      General Comments      Exercises        Assessment/Plan    PT Assessment Patient needs continued PT services  PT Diagnosis Difficulty walking;Acute pain   PT Problem List Decreased activity tolerance;Decreased mobility;Decreased knowledge of use of DME;Decreased safety awareness;Decreased knowledge of precautions;Pain  PT Treatment Interventions DME instruction;Gait training;Functional mobility training;Therapeutic activities;Patient/family education   PT Goals (Current goals can be found in the Care Plan section) Acute Rehab PT Goals Patient Stated Goal: be independent PT Goal Formulation: With patient Time For Goal Achievement: 07/28/14 Potential to Achieve Goals: Good    Frequency Min 5X/week   Barriers to discharge        Co-evaluation               End of Session Equipment Utilized During Treatment: Gait belt;Back brace Activity Tolerance: Patient tolerated treatment well Patient left: in chair;with call bell/phone within reach;with chair alarm set;with family/visitor present Nurse Communication: Mobility status         Time: 1040-1107 PT Time Calculation (min) (ACUTE ONLY): 27 min   Charges:   PT Evaluation $Initial PT Evaluation Tier I: 1 Procedure PT Treatments $Gait Training: 8-22 mins   PT G Codes:        , 2014-08-15, 12:29 PM Pager [phone]

## 2014-07-24 NOTE — Evaluation (Signed)
Occupational Therapy Evaluation Patient Details Name: Kathy MediciMyra L Avey MRN: [id number] DOB: 08-16-1955 Today's Date: 07/24/2014    History of Present Illness s/p Laminectomy L2-3 and L3 for posterior lumbar interbody arthrodesis with pedicle screw fixation L2-L4.   Clinical Impression   Patient required assistance with LB ADLs PTA secondary to back pain/discomfort and was mod I using RW for functional mobility. Patient currently functioning at up to a total assist level for LB ADLs and supervision level for functional mobility/transfers. Patient will benefit from acute OT to increase overall independence in the areas of ADLs, functional mobility, overall safety, and education for AE(sock aid, reacher, LH sponge, LH shoe horn) in order to safely discharge home.     Follow Up Recommendations  No OT follow up;Supervision - Intermittent    Equipment Recommendations  None recommended by OT    Recommendations for Other Services  None at this time     Precautions / Restrictions Precautions Precautions: Fall;Back Restrictions Weight Bearing Restrictions: No      Mobility Bed Mobility Overal bed mobility: Needs Assistance Bed Mobility: Rolling;Sit to Supine Rolling: Supervision     Sit to supine: Supervision   General bed mobility comments: No bed rails used, HOB lowered  Transfers Overall transfer level: Needs assistance Equipment used: Rolling walker (2 wheeled) Transfers: Sit to/from Stand Sit to Stand: Supervision         General transfer comment: Sueprvision to adhere to back precautions while using RW and grab bar during transfer off toilet    Balance Overall balance assessment: Needs assistance         Standing balance support: Bilateral upper extremity supported;During functional activity Standing balance-Leahy Scale: Good     ADL Overall ADL's : Needs assistance/impaired Eating/Feeding: Independent   Grooming: Supervision/safety;Standing   Upper Body  Bathing: Sitting;Supervision/ safety;Cueing for safety   Lower Body Bathing: Moderate assistance;Cueing for safety;Sit to/from stand;Cueing for back precautions   Upper Body Dressing : Supervision/safety;Cueing for safety   Lower Body Dressing: Total assistance;Cueing for back precautions;Sit to/from stand;Cueing for safety   Toilet Transfer: Supervision/safety;Ambulation;Cueing for safety           Functional mobility during ADLs: Supervision/safety;Rolling walker;Cueing for safety General ADL Comments: Educated patient on back precautions (No BAT), donning/doffing of lumbar corset, lumbar corset wearing orders, toilet transfer, shower stall transfer, AE (reacher, sock aid, LH sponge, LH shoe horn)               Pertinent Vitals/Pain Pain Assessment: 0-10 Pain Score: 8  Pain Location: back Pain Descriptors / Indicators: Aching Pain Intervention(s): Monitored during session;Repositioned     Hand Dominance Right   Extremity/Trunk Assessment Upper Extremity Assessment Upper Extremity Assessment: Overall WFL for tasks assessed   Lower Extremity Assessment Lower Extremity Assessment: Defer to PT evaluation   Cervical / Trunk Assessment Cervical / Trunk Assessment: Normal   Communication Communication Communication: No difficulties   Cognition Arousal/Alertness: Awake/alert Behavior During Therapy: WFL for tasks assessed/performed Overall Cognitive Status: Within Functional Limits for tasks assessed             Home Living Family/patient expects to be discharged to:: Private residence Living Arrangements: Spouse/significant other Available Help at Discharge: Family;Available 24 hours/day;Friend(s) Type of Home: House Home Access: Level entry     Home Layout: One level     Bathroom Shower/Tub: Walk-in shower;Door   Bathroom Toilet: Handicapped height     Home Equipment: Environmental consultantWalker - 2 wheels;Hospital bed;Shower seat;Grab bars - tub/shower;Adaptive  equipment Adaptive Equipment: Reacher;Long-handled  sponge Additional Comments: Recommending sock aid      Prior Functioning/Environment Level of Independence: Needs assistance  Gait / Transfers Assistance Needed: stairs  ADL's / Homemaking Assistance Needed: assist with lower body dressing secondary to pain/discomfort - will benefit from acute OT for education on AE to increase independence with LB ADLs        OT Diagnosis: Generalized weakness;Acute pain   OT Problem List: Decreased strength;Decreased activity tolerance;Impaired balance (sitting and/or standing);Decreased knowledge of use of DME or AE;Decreased knowledge of precautions;Pain   OT Treatment/Interventions: Self-care/ADL training;Energy conservation;DME and/or AE instruction;Therapeutic activities;Patient/family education;Balance training    OT Goals(Current goals can be found in the care plan section) Acute Rehab OT Goals Patient Stated Goal: be independent OT Goal Formulation: With patient Time For Goal Achievement: 08/07/14 Potential to Achieve Goals: Good ADL Goals Pt Will Transfer to Toilet: with modified independence;ambulating;grab bars Pt Will Perform Toileting - Clothing Manipulation and hygiene: with modified independence;with adaptive equipment;sit to/from stand (AE prn) Pt Will Perform Tub/Shower Transfer: with modified independence;shower seat;ambulating;Shower transfer Additional ADL Goal #1: Patient will independently adhere to 3/3 back precautions during functional ADLs/mobility/transfers Additional ADL Goal #2: Patient will independently don/doff lumbar corset in preparation for functional mobility/transfers and ADLs  OT Frequency: Min 2X/week   Barriers to D/C: None known at this time          End of Session Equipment Utilized During Treatment: Rolling walker;Back brace  Activity Tolerance: Patient tolerated treatment well Patient left: in bed;with call bell/phone within reach;with  family/visitor present   Time: 4098-1191 OT Time Calculation (min): 18 min Charges:  OT General Charges $OT Visit: 1 Procedure OT Evaluation $Initial OT Evaluation Tier I: 1 Procedure OT Treatments $Self Care/Home Management : 8-22 mins  , , MS, OTR/L, CLT Pager: [phone]  07/24/2014, 11:52 AM

## 2014-07-24 NOTE — Progress Notes (Signed)
Patient ID: Kathy MediciMyra L Coleman, female   DOB: 06-23-1956, 59 y.o.   MRN: [id number] Incision is clean and dry motor function is good however patient has had decreased blood pressure all day long despite IV fluids. Blood pressure hovers in the 80s to 90s and she's felt faint and weak. Postoperative hemoglobin is 8.3 secondary to acute blood loss anemia from surgery. Patient will require transfusion of 2 units packed cells and I have discussed this with her today. Will see how she does after the 2 unit transfusion.

## 2014-07-24 NOTE — Progress Notes (Signed)
Orthopedic Tech Progress Note Patient Details:  Kathy Coleman 1956/02/02 [id number] Brace order completed by bio-tech vendor. Patient ID: Kathy MediciMyra L Coleman, female   DOB: 1956/02/02, 59 y.o.   MRN: [id number]   Jennye MoccasinHughes,  Craig 07/24/2014, 3:04 PM

## 2014-07-24 NOTE — Progress Notes (Signed)
Utilization review completed.  

## 2014-07-25 LAB — TYPE AND SCREEN
ABO/RH(D): O POS
Antibody Screen: NEGATIVE
UNIT DIVISION: 0
Unit division: 0

## 2014-07-25 MED ORDER — MUPIROCIN CALCIUM 2 % EX CREA
TOPICAL_CREAM | CUTANEOUS | Status: DC | PRN
  Administered 2014-07-25: 21:00:00 via TOPICAL
  Filled 2014-07-25: qty 15

## 2014-07-25 NOTE — Progress Notes (Signed)
Occupational Therapy Discharge Patient Details Name: Kathy Coleman MRN: [id number] DOB: 04/27/1956 Today's Date: 07/25/2014  Patient discharged from OT services secondary to goals met and no further OT needs identified.  Please see latest therapy progress note for current level of functioning and progress toward goals.    Progress and discharge plan discussed with patient and/or caregiver: Patient/Caregiver agrees with plan   , 07/25/2014, 10:20 AM  

## 2014-07-25 NOTE — Progress Notes (Signed)
Patient gets out of bed without staff assistance. Patient's husband turns off bed alarm. Patient was educated about fall preventions and asked to call nurse or tech prior to ambulation. Call bell within patient's reach. RN seated close to patient's room. Will continue to monitor.

## 2014-07-25 NOTE — Progress Notes (Addendum)
Occupational Therapy Treatment Patient Details Name: Kathy Coleman MRN: [id number] DOB: 05-02-1956 Today's Date: 07/25/2014    History of present illness Adm 07/23/14 for L2-4 decompression and fusion PMHx- back surgery x3, neck surgery x2, OA, HTN   OT comments  D/C patient from acute OT services, all goals met and education completed  Follow Up Recommendations  No OT follow up;Supervision - Intermittent    Equipment Recommendations  None recommended by OT    Recommendations for Other Services  None at this time    Precautions / Restrictions Precautions Precautions: Fall;Back Precaution Comments: Patient independently able to verbalize and adhere to 3/3 back precautions (BAT) Required Braces or Orthoses: Spinal Brace Spinal Brace: Lumbar corset;Applied in sitting position (patient able to independently don/doff lumbar corset) Restrictions Weight Bearing Restrictions: No       Mobility Bed Mobility Overal bed mobility: Modified Independent             General bed mobility comments: Patient does not require cues to maintain back precautions during bed mobility  Transfers Overall transfer level: Modified independent               General transfer comment: Patient does not require cues to maintain back precautions during functional transfers, uses RW for safety    Balance Overall balance assessment: No apparent balance deficits (not formally assessed)                                 ADL Overall ADL's : Modified independent General ADL Comments: Using AE (reacher, sock aid, LH sponge), patient able to complete LB ADLs at a mod I level and UB ADLs independently. Patient uses RW for functional mobility/transfers at a mod I level. Patient independently adheres to back precautions during functional tasks. Recommended patient purchse hip kit for increased independence once home.                 Cognition   Behavior During Therapy: WFL for tasks  assessed/performed Overall Cognitive Status: Within Functional Limits for tasks assessed                 Pertinent Vitals/ Pain       Pain Assessment: 0-10 Pain Score: 6  Pain Location: back Pain Descriptors / Indicators: Aching Pain Intervention(s): Monitored during session;Repositioned  Home Living   Prior Functioning/Environment    Frequency   n/a, d/c from acute OT    Progress Toward Goals  OT Goals(current goals can now be found in the care plan section)  Progress towards OT goals: Goals met/education completed, patient discharged from OT  Goals met  Plan All goals met and education completed, patient discharged from OT services       End of Session Equipment Utilized During Treatment: Rolling walker;Back brace   Activity Tolerance Patient tolerated treatment well   Patient Left   in bed;with call bell/phone within reach;with family/visitor present        Time: 6803-2122 OT Time Calculation (min): 15 min  Charges: OT General Charges $OT Visit: 1 Procedure OT Treatments $Self Care/Home Management : 8-22 mins  , , MS, OTR/L, CLT Pager: [phone]

## 2014-07-25 NOTE — Progress Notes (Signed)
Patient ID: Kathy MediciMyra L Coleman, female   DOB: [birth year], 59 y.o.   MRN: [id number] Feels better after receiving 2 units PRBCs Pain under better control also Still has bilateral buttock pain Incision is clean and dry Dressing is intact Will allow patient to shower.

## 2014-07-25 NOTE — Plan of Care (Signed)
Problem: Acute Rehab OT Goals (only OT should resolve) Goal: Pt. Will Perform Tub/Shower Transfer Outcome: Completed/Met Date Met:  07/25/14 Patient verbalized understanding for shower transfer

## 2014-07-25 NOTE — Progress Notes (Signed)
Physical Therapy Treatment Patient Details Name: Kathy Coleman MRN: [id number] DOB: January 14, 1956 Today's Date: 07/25/2014    History of Present Illness Adm 07/23/14 for L2-4 decompression and fusion PMHx- back surgery x3, neck surgery x2, OA, HTN    PT Comments    Patient progressing well with mobility. Demonstrated safe transfers and gait today adhering to back precautions. Able to independently verbalize 3/3 precautions. Pt has met all goals and does not require further skilled therapy services. All education completed and questions answered. Encourage ambulation daily with supervision. Discharge from therapy.   Follow Up Recommendations  No PT follow up;Supervision for mobility/OOB     Equipment Recommendations  None recommended by PT    Recommendations for Other Services       Precautions / Restrictions Precautions Precautions: Back Precaution Booklet Issued: Yes (comment) Precaution Comments: Patient independently able to verbalize and adhere to 3/3 back precautions (BAT) Required Braces or Orthoses: Spinal Brace Spinal Brace: Lumbar corset;Applied in sitting position Restrictions Weight Bearing Restrictions: No    Mobility  Bed Mobility Overal bed mobility: Modified Independent Bed Mobility: Rolling;Sidelying to Sit;Sit to Sidelying Rolling: Modified independent (Device/Increase time) Sidelying to sit: Modified independent (Device/Increase time)     Sit to sidelying: Modified independent (Device/Increase time) General bed mobility comments: Demonstrates safe log roll technique. HOB flat, no rails to simulate home.  Transfers Overall transfer level: Modified independent Equipment used: Rolling walker (2 wheeled) Transfers: Sit to/from Stand Sit to Stand: Modified independent (Device/Increase time)            Ambulation/Gait Ambulation/Gait assistance: Supervision Ambulation Distance (Feet): 500 Feet Assistive device: Rolling walker (2 wheeled) Gait  Pattern/deviations: Step-through pattern;Decreased stride length     General Gait Details: Ambulated with and without RW. Gait speed decreased without use of RW. Balance improves with RW.    Stairs            Wheelchair Mobility    Modified Rankin (Stroke Patients Only)       Balance Overall balance assessment: No apparent balance deficits (not formally assessed)                                  Cognition Arousal/Alertness: Awake/alert Behavior During Therapy: WFL for tasks assessed/performed Overall Cognitive Status: Within Functional Limits for tasks assessed                      Exercises      General Comments        Pertinent Vitals/Pain Pain Assessment: 0-10 Pain Score:  (not rated on pain scale.) Pain Location: back at surgical site Pain Descriptors / Indicators: Sore Pain Intervention(s): Monitored during session;Repositioned    Home Living                      Prior Function            PT Goals (current goals can now be found in the care plan section) Progress towards PT goals: Goals met/education completed, patient discharged from PT    Frequency  Min 5X/week    PT Plan Current plan remains appropriate    Co-evaluation             End of Session Equipment Utilized During Treatment: Gait belt;Back brace Activity Tolerance: Patient tolerated treatment well Patient left: in bed;with call bell/phone within reach;with bed alarm set;with family/visitor present     Time:  0814-4818 PT Time Calculation (min) (ACUTE ONLY): 15 min  Charges:  $Gait Training: 8-22 mins                    G CodesCandy Sledge A 2014/08/12, 4:56 PM Candy Sledge, PT, DPT [phone]

## 2014-07-26 MED ORDER — OXYCODONE-ACETAMINOPHEN 5-325 MG PO TABS: 1.0000 | ORAL_TABLET | ORAL | Status: DC | PRN

## 2014-07-26 MED ORDER — DEXAMETHASONE 1 MG PO TABS: ORAL_TABLET | ORAL | Status: DC

## 2014-07-26 MED ORDER — DIAZEPAM 5 MG PO TABS: 5.0000 mg | ORAL_TABLET | Freq: Four times a day (QID) | ORAL | Status: DC | PRN

## 2014-07-26 NOTE — Progress Notes (Signed)
Pt ambulated this am with rolling walker and brace, standby assist in the hallway. Gait steady. Will continue to monitor.

## 2014-07-26 NOTE — Discharge Summary (Signed)
Physician Discharge Summary  Patient ID: Kathy Coleman MRN: [id number] DOB/AGE: 09-08-1955 59 y.o.  Admit date: 07/23/2014 Discharge date: 07/26/2014  Admission Diagnoses: Lumbar spondylosis and stenosis L2-3 and L3-4 status post arthrodesis L4  Discharge Diagnoses: Lumbar spondylosis and stenosis L2-3 and L3-4 status post arthrodesis L4 to sacrum, right lumbar radiculopathy Active Problems:   Postoperative anemia due to acute blood loss   Lumbar radiculopathy   Discharged Condition: good  Hospital Course: Patient was admitted to undergo surgical decompression and stabilization at L2-3 and L3 for having had a previous arthrodesis L4 to sacrum she tolerated surgery well.  Consults: None  Significant Diagnostic Studies: None  Treatments: surgery: Laminectomy and decompression L2-3 and L3 for posterior lumbar interbody arthrodesis L2-3 L3-4 segmental fixation L2 to L4. Sternal lateral arthrodesis L2-L4  Discharge Exam: Blood pressure 117/58, pulse 62, temperature 98.3 F (36.8 C), temperature source Oral, resp. rate 20, height 5\' 6"  (1.676 m), weight 81.647 kg (180 lb), SpO2 100 %. Incision is clean and dry motor function is intact in lower extremities.  Disposition:  discharge home  Discharge Instructions    Call MD for:  redness, tenderness, or signs of infection (pain, swelling, redness, odor or green/yellow discharge around incision site)    Complete by:  As directed      Call MD for:  severe uncontrolled pain    Complete by:  As directed      Call MD for:  temperature >100.4    Complete by:  As directed      Diet - low sodium heart healthy    Complete by:  As directed      Discharge instructions    Complete by:  As directed   Okay to shower. Do not apply salves or appointments to incision. No heavy lifting with the upper extremities greater than 15 pounds. May resume driving when not requiring pain medication and patient feels comfortable with doing so.     Increase  activity slowly    Complete by:  As directed             Medication List    TAKE these medications        atorvastatin 20 MG tablet  Commonly known as:  LIPITOR  Take 20 mg by mouth daily.     dexamethasone 1 MG tablet  Commonly known as:  DECADRON  2 tablets twice daily for 2 days, one tablet twice daily for 2 days, one tablet daily for 2 days.     diazepam 5 MG tablet  Commonly known as:  VALIUM  Take 1 tablet (5 mg total) by mouth every 6 (six) hours as needed for muscle spasms.     gabapentin 300 MG capsule  Commonly known as:  NEURONTIN  Take 300 mg by mouth 3 (three) times daily.     HYDROcodone-acetaminophen 10-325 MG per tablet  Commonly known as:  NORCO  Take 1 tablet by mouth at bedtime as needed.     lisinopril 20 MG tablet  Commonly known as:  PRINIVIL,ZESTRIL  Take 20 mg by mouth daily.     meloxicam 15 MG tablet  Commonly known as:  MOBIC  Take 15 mg by mouth daily.     oxyCODONE-acetaminophen 5-325 MG per tablet  Commonly known as:  PERCOCET/ROXICET  Take 1-2 tablets by mouth every 4 (four) hours as needed for moderate pain.     ranitidine 150 MG tablet  Commonly known as:  ZANTAC  Take 150 mg by mouth  at bedtime as needed for heartburn.     traMADol 50 MG tablet  Commonly known as:  ULTRAM  Take 50-100 mg by mouth every 8 (eight) hours as needed.         SignedStefani Dama 07/26/2014, 1:50 PM

## 2014-07-26 NOTE — Progress Notes (Signed)
Pt discharging with husband home at this time. Alert, verbal with no noted distress. IV discontinued. Discharge instructions and prescriptions provided to pt with verbal understanding. Pt took all personal belongings and brace. Pt made aware of follow up appt.

## 2014-07-26 NOTE — Progress Notes (Signed)
Pt ambulated in hallway with brace and walker. Pt tolerated well. Will continue to monitor.

## 2014-08-03 NOTE — Addendum Note (Signed)
Addendum  created 08/03/14 0731 by Achille RichAdam , MD   Modules edited: Anesthesia Attestations

## 2015-04-16 ENCOUNTER — Other Ambulatory Visit: Payer: Self-pay | Admitting: Orthopedic Surgery

## 2015-05-03 ENCOUNTER — Encounter (HOSPITAL_COMMUNITY): Payer: Self-pay

## 2015-05-03 ENCOUNTER — Encounter (HOSPITAL_COMMUNITY)
Admission: RE | Admit: 2015-05-03 | Discharge: 2015-05-03 | Disposition: A | Discharge status: Home / Self Care | Payer: Medicare FFS | Source: Ambulatory Visit | Attending: Orthopedic Surgery | Admitting: Orthopedic Surgery

## 2015-05-03 ENCOUNTER — Ambulatory Visit (HOSPITAL_COMMUNITY)
Admission: RE | Admit: 2015-05-03 | Discharge: 2015-05-03 | Disposition: A | Discharge status: Home / Self Care | Payer: Medicare FFS | Source: Ambulatory Visit | Attending: Orthopedic Surgery | Admitting: Orthopedic Surgery

## 2015-05-03 DIAGNOSIS — Z0183 Encounter for blood typing: Secondary | ICD-10-CM | POA: Insufficient documentation

## 2015-05-03 DIAGNOSIS — Z01818 Encounter for other preprocedural examination: Secondary | ICD-10-CM | POA: Diagnosis present

## 2015-05-03 DIAGNOSIS — Z01812 Encounter for preprocedural laboratory examination: Secondary | ICD-10-CM | POA: Insufficient documentation

## 2015-05-03 DIAGNOSIS — Z981 Arthrodesis status: Secondary | ICD-10-CM | POA: Diagnosis not present

## 2015-05-03 DIAGNOSIS — M5134 Other intervertebral disc degeneration, thoracic region: Secondary | ICD-10-CM | POA: Diagnosis not present

## 2015-05-03 DIAGNOSIS — M1611 Unilateral primary osteoarthritis, right hip: Secondary | ICD-10-CM | POA: Diagnosis not present

## 2015-05-03 LAB — CBC WITH DIFFERENTIAL/PLATELET
Basophils Absolute: 0 10*3/uL (ref 0.0–0.1)
Basophils Relative: 0 %
EOS ABS: 0.1 10*3/uL (ref 0.0–0.7)
EOS PCT: 1 %
HCT: 37.7 % (ref 36.0–46.0)
Hemoglobin: 12.9 g/dL (ref 12.0–15.0)
LYMPHS ABS: 1.1 10*3/uL (ref 0.7–4.0)
Lymphocytes Relative: 24 %
MCH: 31.5 pg (ref 26.0–34.0)
MCHC: 34.2 g/dL (ref 30.0–36.0)
MCV: 92.2 fL (ref 78.0–100.0)
MONO ABS: 0.3 10*3/uL (ref 0.1–1.0)
MONOS PCT: 8 %
Neutro Abs: 3.1 10*3/uL (ref 1.7–7.7)
Neutrophils Relative %: 67 %
PLATELETS: 211 10*3/uL (ref 150–400)
RBC: 4.09 MIL/uL (ref 3.87–5.11)
RDW: 12.3 % (ref 11.5–15.5)
WBC: 4.6 10*3/uL (ref 4.0–10.5)

## 2015-05-03 LAB — URINALYSIS, ROUTINE W REFLEX MICROSCOPIC
BILIRUBIN URINE: NEGATIVE
Glucose, UA: NEGATIVE mg/dL
Ketones, ur: NEGATIVE mg/dL
NITRITE: NEGATIVE
Protein, ur: NEGATIVE mg/dL
SPECIFIC GRAVITY, URINE: 1.007 (ref 1.005–1.030)
UROBILINOGEN UA: 0.2 mg/dL (ref 0.0–1.0)
pH: 5 (ref 5.0–8.0)

## 2015-05-03 LAB — APTT: aPTT: 31 seconds (ref 24–37)

## 2015-05-03 LAB — BASIC METABOLIC PANEL
Anion gap: 7 (ref 5–15)
BUN: 14 mg/dL (ref 6–20)
CALCIUM: 9.1 mg/dL (ref 8.9–10.3)
CHLORIDE: 99 mmol/L — AB (ref 101–111)
CO2: 27 mmol/L (ref 22–32)
CREATININE: 1.07 mg/dL — AB (ref 0.44–1.00)
GFR calc Af Amer: >60 mL/min (ref >60)
GFR calc non Af Amer: 55 mL/min — ABNORMAL LOW (ref >60)
Glucose, Bld: 100 mg/dL — ABNORMAL HIGH (ref 65–99)
Potassium: 4.7 mmol/L (ref 3.5–5.1)
Sodium: 133 mmol/L — ABNORMAL LOW (ref 135–145)

## 2015-05-03 LAB — SURGICAL PCR SCREEN
MRSA, PCR: NEGATIVE
Staphylococcus aureus: NEGATIVE

## 2015-05-03 LAB — PROTIME-INR
INR: 1.07 (ref 0.00–1.49)
PROTHROMBIN TIME: 14.1 s (ref 11.6–15.2)

## 2015-05-03 LAB — URINE MICROSCOPIC-ADD ON

## 2015-05-03 LAB — TYPE AND SCREEN
ABO/RH(D): O POS
Antibody Screen: NEGATIVE

## 2015-05-03 NOTE — Pre-Procedure Instructions (Signed)
    Delisha L Huq  05/03/2015      [facility] - [place], [place] - [id number] [place] [id number] Phone: [phone] Fax: [phone]  [facility] [place], [place] - [id number] [place] [id number] Phone: [phone] Fax: [phone]    Your procedure is scheduled on 05/13/15.  [facility].M.  Call this number if you have problems the morning of surgery:  [phone]   Remember:  Do not eat food or drink liquids after midnight.  Take these medicines the morning of surgery with A SIP OF WATER --valium,endocet,neurontin   Do not wear jewelry, make-up or nail polish.  Do not wear lotions, powders, or perfumes.  You may wear deodorant.  Do not shave 48 hours prior to surgery.  Men may shave face and neck.  Do not bring valuables to the hospital.  [facility] is not responsible for any belongings or valuables.  Contacts, dentures or bridgework may not be worn into surgery.  Leave your suitcase in the car.  After surgery it may be brought to your room.  For patients admitted to the hospital, discharge time will be determined by your treatment team.  Patients discharged the day of surgery will not be allowed to drive home.   Name and phone number of your driver:   Special instructions:   Please read over the following fact sheets that you were given. Pain Booklet, Coughing and Deep Breathing, Blood Transfusion Information, MRSA Information and Surgical Site Infection Prevention

## 2015-05-10 NOTE — H&P (Signed)
TOTAL HIP ADMISSION H&P  Patient is admitted for right total hip arthroplasty.  Subjective:  Chief Complaint: right hip pain  HPI: Kathy Coleman, 59 y.o. female, has a history of pain and functional disability in the right hip(s) due to arthritis and patient has failed non-surgical conservative treatments for greater than 12 weeks to include NSAID's and/or analgesics, corticosteriod injections, use of assistive devices, weight reduction as appropriate and activity modification.  Onset of symptoms was gradual starting 2 years ago with gradually worsening course since that time.The patient noted no past surgery on the right hip(s).  Patient currently rates pain in the right hip at 10 out of 10 with activity. Patient has night pain, worsening of pain with activity and weight bearing, pain that interfers with activities of daily living and pain with passive range of motion. Patient has evidence of joint space narrowing by imaging studies. This condition presents safety issues increasing the risk of falls.  There is no current active infection.  Patient Active Problem List   Diagnosis Date Noted  . Postoperative anemia due to acute blood loss 07/24/2014  . Lumbar radiculopathy 07/23/2014   Past Medical History  Diagnosis Date  . PONV (postoperative nausea and vomiting)   . Hypertension   . Arthritis     Past Surgical History  Procedure Laterality Date  . Foot surgery      RIGHT  2014  . Cholecystectomy    . Abdominal hysterectomy    . Back surgery      1998  , 2010  LOW BACK    . Neck surgery      2005, 2010    No prescriptions prior to admission   No Known Allergies  Social History  Substance Use Topics  . Smoking status: Former Games developermoker  . Smokeless tobacco: Not on file  . Alcohol Use: No    No family history on file.   Review of Systems  Constitutional: Negative.   HENT: Negative.   Eyes: Negative.   Respiratory: Negative.   Cardiovascular:       HTN  Gastrointestinal:  Positive for constipation.  Genitourinary:       Kidney stones  Musculoskeletal: Positive for joint pain.  Skin: Negative.   Neurological: Negative.   Endo/Heme/Allergies: Bruises/bleeds easily.  Psychiatric/Behavioral: Negative.     Objective:  Physical Exam  Constitutional: She is oriented to person, place, and time. She appears well-developed and well-nourished.  HENT:  Head: Normocephalic and atraumatic.  Eyes: Pupils are equal, round, and reactive to light.  Neck: Normal range of motion. Neck supple.  Cardiovascular: Intact distal pulses.   Respiratory: Effort normal.  Musculoskeletal: She exhibits tenderness.  Patient has significant discomfort with internal rotation of the right hip to 20, foot tap is negative.  She does walk with an antalgic gait.  Trendelenburg on the right side.  Normal sensation to the feet.  Normal pulses to the feet.  Skin is intact no cuts, scrapes or abrasions.    Neurological: She is alert and oriented to person, place, and time.  Skin: Skin is warm and dry.  Psychiatric: She has a normal mood and affect. Her behavior is normal. Judgment and thought content normal.    Vital signs in last 24 hours:    Labs:   Estimated body mass index is 29.36 kg/(m^2) as calculated from the following:   Height as of 07/23/14: 5\' 6"  (1.676 m).   Weight as of 07/13/14: 82.464 kg (181 lb 12.8 oz).  Imaging Review Plain radiographs demonstrate osteoarthritis, right greater than left hip, mostly affecting the central and inferior acetabulum bilaterally.    Assessment/Plan:  End stage arthritis, right hip(s)  The patient history, physical examination, clinical judgement of the provider and imaging studies are consistent with end stage degenerative joint disease of the right hip(s) and total hip arthroplasty is deemed medically necessary. The treatment options including medical management, injection therapy, arthroscopy and arthroplasty were discussed at length.  The risks and benefits of total hip arthroplasty were presented and reviewed. The risks due to aseptic loosening, infection, stiffness, dislocation/subluxation,  thromboembolic complications and other imponderables were discussed.  The patient acknowledged the explanation, agreed to proceed with the plan and consent was signed. Patient is being admitted for inpatient treatment for surgery, pain control, PT, OT, prophylactic antibiotics, VTE prophylaxis, progressive ambulation and ADL's and discharge planning.The patient is planning to be discharged home with home health services

## 2015-05-11 DIAGNOSIS — M1611 Unilateral primary osteoarthritis, right hip: Secondary | ICD-10-CM | POA: Diagnosis present

## 2015-05-11 NOTE — Progress Notes (Signed)
Patient called to inquire about taking her BP medication day of surgery. Advised patient not to take blood pressure medication.

## 2015-05-12 MED ORDER — TRANEXAMIC ACID 1000 MG/10ML IV SOLN
1000.0000 mg | INTRAVENOUS | Status: AC
  Administered 2015-05-13: 1000 mg via INTRAVENOUS
  Filled 2015-05-12: qty 10

## 2015-05-12 MED ORDER — CEFAZOLIN SODIUM-DEXTROSE 2-3 GM-% IV SOLR
2.0000 g | INTRAVENOUS | Status: AC
  Administered 2015-05-13: 2 g via INTRAVENOUS
  Filled 2015-05-12: qty 50

## 2015-05-12 MED ORDER — TRANEXAMIC ACID 1000 MG/10ML IV SOLN
1000.0000 mg | INTRAVENOUS | Status: DC
  Filled 2015-05-12: qty 10

## 2015-05-12 NOTE — Anesthesia Preprocedure Evaluation (Addendum)
Anesthesia Evaluation  Patient identified by MRN, date of birth, ID band Patient awake    Reviewed: Allergy & Precautions, NPO status , Patient's Chart, lab work & pertinent test results  History of Anesthesia Complications (+) PONV  Airway Mallampati: II  TM Distance: >3 FB Neck ROM: Full    Dental  (+) Teeth Intact, Dental Advisory Given   Pulmonary neg pulmonary ROS, former smoker,    breath sounds clear to auscultation       Cardiovascular hypertension, Pt. on medications  Rhythm:Regular Rate:Normal     Neuro/Psych negative neurological ROS     GI/Hepatic negative GI ROS, Neg liver ROS,   Endo/Other  negative endocrine ROS  Renal/GU negative Renal ROS     Musculoskeletal  (+) Arthritis ,   Abdominal   Peds  Hematology negative hematology ROS (+)   Anesthesia Other Findings   Reproductive/Obstetrics                            Lab Results  Component Value Date   WBC 4.6 05/03/2015   HGB 12.9 05/03/2015   HCT 37.7 05/03/2015   MCV 92.2 05/03/2015   PLT 211 05/03/2015   Lab Results  Component Value Date   CREATININE 1.07* 05/03/2015   BUN 14 05/03/2015   NA 133* 05/03/2015   K 4.7 05/03/2015   CL 99* 05/03/2015   CO2 27 05/03/2015    Anesthesia Physical Anesthesia Plan  ASA: II  Anesthesia Plan: General   Post-op Pain Management:    Induction: Intravenous  Airway Management Planned: Oral ETT  Additional Equipment:   Intra-op Plan:   Post-operative Plan: Extubation in OR  Informed Consent: I have reviewed the patients History and Physical, chart, labs and discussed the procedure including the risks, benefits and alternatives for the proposed anesthesia with the patient or authorized representative who has indicated his/her understanding and acceptance.   Dental advisory given  Plan Discussed with: CRNA  Anesthesia Plan Comments:        Anesthesia  Quick Evaluation

## 2015-05-13 ENCOUNTER — Inpatient Hospital Stay (HOSPITAL_COMMUNITY): Payer: Medicare FFS

## 2015-05-13 ENCOUNTER — Encounter (HOSPITAL_COMMUNITY): Payer: Self-pay | Admitting: *Deleted

## 2015-05-13 ENCOUNTER — Inpatient Hospital Stay (HOSPITAL_COMMUNITY): Payer: Medicare FFS | Admitting: Anesthesiology

## 2015-05-13 ENCOUNTER — Inpatient Hospital Stay (HOSPITAL_COMMUNITY)
Admission: RE | Admit: 2015-05-13 | Discharge: 2015-05-15 | DRG: 470 | Disposition: A | Discharge status: Home Health | Payer: Medicare FFS | Source: Ambulatory Visit | Attending: Orthopedic Surgery | Admitting: Orthopedic Surgery

## 2015-05-13 ENCOUNTER — Encounter (HOSPITAL_COMMUNITY)
Admission: RE | Disposition: A | Discharge status: Home Health | Payer: Self-pay | Source: Ambulatory Visit | Attending: Orthopedic Surgery

## 2015-05-13 DIAGNOSIS — M25551 Pain in right hip: Secondary | ICD-10-CM | POA: Diagnosis present

## 2015-05-13 DIAGNOSIS — K5900 Constipation, unspecified: Secondary | ICD-10-CM | POA: Diagnosis present

## 2015-05-13 DIAGNOSIS — D62 Acute posthemorrhagic anemia: Secondary | ICD-10-CM | POA: Diagnosis not present

## 2015-05-13 DIAGNOSIS — I10 Essential (primary) hypertension: Secondary | ICD-10-CM | POA: Diagnosis present

## 2015-05-13 DIAGNOSIS — Z96649 Presence of unspecified artificial hip joint: Secondary | ICD-10-CM

## 2015-05-13 DIAGNOSIS — M1611 Unilateral primary osteoarthritis, right hip: Principal | ICD-10-CM | POA: Diagnosis present

## 2015-05-13 DIAGNOSIS — Z87891 Personal history of nicotine dependence: Secondary | ICD-10-CM

## 2015-05-13 DIAGNOSIS — Z419 Encounter for procedure for purposes other than remedying health state, unspecified: Secondary | ICD-10-CM

## 2015-05-13 HISTORY — PX: TOTAL HIP ARTHROPLASTY: SHX124

## 2015-05-13 SURGERY — ARTHROPLASTY, HIP, TOTAL, ANTERIOR APPROACH
Anesthesia: General | Site: Hip | Laterality: Right

## 2015-05-13 MED ORDER — KCL IN DEXTROSE-NACL 20-5-0.45 MEQ/L-%-% IV SOLN
INTRAVENOUS | Status: DC
  Administered 2015-05-13 (×2): via INTRAVENOUS
  Filled 2015-05-13 (×2): qty 1000

## 2015-05-13 MED ORDER — DEXAMETHASONE SODIUM PHOSPHATE 10 MG/ML IJ SOLN
10.0000 mg | Freq: Once | INTRAMUSCULAR | Status: AC
  Administered 2015-05-14: 10 mg via INTRAVENOUS
  Filled 2015-05-13: qty 1

## 2015-05-13 MED ORDER — OXYCODONE HCL 5 MG PO TABS: 5.0000 mg | ORAL_TABLET | Freq: Once | ORAL | Status: DC | PRN

## 2015-05-13 MED ORDER — GLYCOPYRROLATE 0.2 MG/ML IJ SOLN
INTRAMUSCULAR | Status: AC
  Filled 2015-05-13: qty 3

## 2015-05-13 MED ORDER — OXYCODONE HCL 5 MG PO TABS
5.0000 mg | ORAL_TABLET | ORAL | Status: DC | PRN
  Administered 2015-05-13 – 2015-05-15 (×8): 10 mg via ORAL
  Filled 2015-05-13 (×8): qty 2

## 2015-05-13 MED ORDER — FLEET ENEMA 7-19 GM/118ML RE ENEM: 1.0000 | ENEMA | Freq: Once | RECTAL | Status: DC | PRN

## 2015-05-13 MED ORDER — ONDANSETRON HCL 4 MG PO TABS: 4.0000 mg | ORAL_TABLET | Freq: Four times a day (QID) | ORAL | Status: DC | PRN

## 2015-05-13 MED ORDER — LACTATED RINGERS IV SOLN
INTRAVENOUS | Status: DC | PRN
  Administered 2015-05-13 (×2): via INTRAVENOUS

## 2015-05-13 MED ORDER — METOCLOPRAMIDE HCL 5 MG PO TABS: 5.0000 mg | ORAL_TABLET | Freq: Three times a day (TID) | ORAL | Status: DC | PRN

## 2015-05-13 MED ORDER — METHOCARBAMOL 500 MG PO TABS: 500.0000 mg | ORAL_TABLET | Freq: Two times a day (BID) | ORAL | Status: DC

## 2015-05-13 MED ORDER — ENDOCET 10-325 MG PO TABS: 1.0000 | ORAL_TABLET | ORAL | Status: DC | PRN

## 2015-05-13 MED ORDER — PROPOFOL 10 MG/ML IV BOLUS
INTRAVENOUS | Status: AC
  Filled 2015-05-13: qty 20

## 2015-05-13 MED ORDER — BISACODYL 5 MG PO TBEC: 5.0000 mg | DELAYED_RELEASE_TABLET | Freq: Every day | ORAL | Status: DC | PRN

## 2015-05-13 MED ORDER — PROPOFOL 10 MG/ML IV BOLUS
INTRAVENOUS | Status: DC | PRN
  Administered 2015-05-13: 120 mg via INTRAVENOUS

## 2015-05-13 MED ORDER — FENTANYL CITRATE (PF) 100 MCG/2ML IJ SOLN
INTRAMUSCULAR | Status: DC | PRN
  Administered 2015-05-13 (×3): 50 ug via INTRAVENOUS
  Administered 2015-05-13: 150 ug via INTRAVENOUS
  Administered 2015-05-13 (×4): 50 ug via INTRAVENOUS

## 2015-05-13 MED ORDER — FENTANYL CITRATE (PF) 250 MCG/5ML IJ SOLN
INTRAMUSCULAR | Status: AC
  Filled 2015-05-13: qty 5

## 2015-05-13 MED ORDER — PHENYLEPHRINE HCL 10 MG/ML IJ SOLN
INTRAMUSCULAR | Status: DC | PRN
  Administered 2015-05-13: 120 mg via INTRAVENOUS
  Administered 2015-05-13 (×3): 80 mg via INTRAVENOUS

## 2015-05-13 MED ORDER — PROMETHAZINE HCL 25 MG/ML IJ SOLN: 6.2500 mg | INTRAMUSCULAR | Status: DC | PRN

## 2015-05-13 MED ORDER — DIPHENHYDRAMINE HCL 12.5 MG/5ML PO ELIX: 12.5000 mg | ORAL_SOLUTION | ORAL | Status: DC | PRN

## 2015-05-13 MED ORDER — ONDANSETRON HCL 4 MG/2ML IJ SOLN
INTRAMUSCULAR | Status: DC | PRN
  Administered 2015-05-13: 4 mg via INTRAVENOUS

## 2015-05-13 MED ORDER — HYDROMORPHONE HCL 1 MG/ML IJ SOLN
0.5000 mg | INTRAMUSCULAR | Status: DC | PRN
  Administered 2015-05-13 (×3): 1 mg via INTRAVENOUS
  Filled 2015-05-13 (×3): qty 1

## 2015-05-13 MED ORDER — EPHEDRINE SULFATE 50 MG/ML IJ SOLN
INTRAMUSCULAR | Status: AC
  Filled 2015-05-13: qty 1

## 2015-05-13 MED ORDER — METHOCARBAMOL 500 MG PO TABS
500.0000 mg | ORAL_TABLET | Freq: Four times a day (QID) | ORAL | Status: DC | PRN
  Administered 2015-05-13 – 2015-05-15 (×3): 500 mg via ORAL
  Filled 2015-05-13 (×3): qty 1

## 2015-05-13 MED ORDER — ACETAMINOPHEN 650 MG RE SUPP: 650.0000 mg | Freq: Four times a day (QID) | RECTAL | Status: DC | PRN

## 2015-05-13 MED ORDER — LIDOCAINE HCL (CARDIAC) 20 MG/ML IV SOLN
INTRAVENOUS | Status: DC | PRN
  Administered 2015-05-13: 60 mg via INTRAVENOUS

## 2015-05-13 MED ORDER — CHLORHEXIDINE GLUCONATE 4 % EX LIQD
60.0000 mL | Freq: Once | CUTANEOUS | Status: DC
Start: 2015-05-13 — End: 2015-05-13

## 2015-05-13 MED ORDER — MIDAZOLAM HCL 5 MG/5ML IJ SOLN
INTRAMUSCULAR | Status: DC | PRN
  Administered 2015-05-13: 2 mg via INTRAVENOUS

## 2015-05-13 MED ORDER — METHOCARBAMOL 1000 MG/10ML IJ SOLN
500.0000 mg | Freq: Four times a day (QID) | INTRAVENOUS | Status: DC | PRN
  Administered 2015-05-13: 500 mg via INTRAVENOUS
  Filled 2015-05-13 (×2): qty 5

## 2015-05-13 MED ORDER — MENTHOL 3 MG MT LOZG: 1.0000 | LOZENGE | OROMUCOSAL | Status: DC | PRN

## 2015-05-13 MED ORDER — HYDROMORPHONE HCL 1 MG/ML IJ SOLN
0.2500 mg | INTRAMUSCULAR | Status: DC | PRN
  Administered 2015-05-13 (×3): 0.5 mg via INTRAVENOUS

## 2015-05-13 MED ORDER — PHENYLEPHRINE 40 MCG/ML (10ML) SYRINGE FOR IV PUSH (FOR BLOOD PRESSURE SUPPORT)
PREFILLED_SYRINGE | INTRAVENOUS | Status: AC
  Filled 2015-05-13: qty 10

## 2015-05-13 MED ORDER — ALUM & MAG HYDROXIDE-SIMETH 200-200-20 MG/5ML PO SUSP: 30.0000 mL | ORAL | Status: DC | PRN

## 2015-05-13 MED ORDER — PHENOL 1.4 % MT LIQD: 1.0000 | OROMUCOSAL | Status: DC | PRN

## 2015-05-13 MED ORDER — NEOSTIGMINE METHYLSULFATE 10 MG/10ML IV SOLN
INTRAVENOUS | Status: DC | PRN
  Administered 2015-05-13: 2 mg via INTRAVENOUS

## 2015-05-13 MED ORDER — OXYCODONE HCL 5 MG/5ML PO SOLN: 5.0000 mg | Freq: Once | ORAL | Status: DC | PRN

## 2015-05-13 MED ORDER — MIDAZOLAM HCL 2 MG/2ML IJ SOLN
INTRAMUSCULAR | Status: AC
  Filled 2015-05-13: qty 4

## 2015-05-13 MED ORDER — ATORVASTATIN CALCIUM 10 MG PO TABS
20.0000 mg | ORAL_TABLET | Freq: Every day | ORAL | Status: DC
  Administered 2015-05-13 – 2015-05-14 (×2): 20 mg via ORAL
  Filled 2015-05-13 (×2): qty 2

## 2015-05-13 MED ORDER — LIDOCAINE HCL (CARDIAC) 20 MG/ML IV SOLN
INTRAVENOUS | Status: AC
  Filled 2015-05-13: qty 5

## 2015-05-13 MED ORDER — PHENYLEPHRINE HCL 10 MG/ML IJ SOLN
10.0000 mg | INTRAVENOUS | Status: DC | PRN
  Administered 2015-05-13: 20 ug/min via INTRAVENOUS

## 2015-05-13 MED ORDER — GLYCOPYRROLATE 0.2 MG/ML IJ SOLN
INTRAMUSCULAR | Status: DC | PRN
  Administered 2015-05-13: .3 mg via INTRAVENOUS

## 2015-05-13 MED ORDER — HYDROMORPHONE HCL 1 MG/ML IJ SOLN
INTRAMUSCULAR | Status: AC
  Filled 2015-05-13: qty 1

## 2015-05-13 MED ORDER — DOCUSATE SODIUM 100 MG PO CAPS
100.0000 mg | ORAL_CAPSULE | Freq: Two times a day (BID) | ORAL | Status: DC
  Administered 2015-05-13 – 2015-05-15 (×4): 100 mg via ORAL
  Filled 2015-05-13 (×4): qty 1

## 2015-05-13 MED ORDER — ROCURONIUM BROMIDE 50 MG/5ML IV SOLN
INTRAVENOUS | Status: AC
  Filled 2015-05-13: qty 1

## 2015-05-13 MED ORDER — SCOPOLAMINE 1 MG/3DAYS TD PT72
MEDICATED_PATCH | TRANSDERMAL | Status: AC
  Administered 2015-05-13: 1 via TRANSDERMAL
  Filled 2015-05-13: qty 1

## 2015-05-13 MED ORDER — METOCLOPRAMIDE HCL 5 MG/ML IJ SOLN: 5.0000 mg | Freq: Three times a day (TID) | INTRAMUSCULAR | Status: DC | PRN

## 2015-05-13 MED ORDER — GABAPENTIN 300 MG PO CAPS
300.0000 mg | ORAL_CAPSULE | Freq: Three times a day (TID) | ORAL | Status: DC
  Administered 2015-05-13 – 2015-05-15 (×6): 300 mg via ORAL
  Filled 2015-05-13: qty 1
  Filled 2015-05-13: qty 3
  Filled 2015-05-13 (×2): qty 1
  Filled 2015-05-13: qty 3
  Filled 2015-05-13: qty 1

## 2015-05-13 MED ORDER — DEXTROSE-NACL 5-0.45 % IV SOLN: INTRAVENOUS | Status: DC

## 2015-05-13 MED ORDER — 0.9 % SODIUM CHLORIDE (POUR BTL) OPTIME
TOPICAL | Status: DC | PRN
  Administered 2015-05-13: 1000 mL

## 2015-05-13 MED ORDER — INDOMETHACIN 25 MG PO CAPS
50.0000 mg | ORAL_CAPSULE | Freq: Three times a day (TID) | ORAL | Status: DC
  Administered 2015-05-13 – 2015-05-15 (×5): 50 mg via ORAL
  Filled 2015-05-13 (×8): qty 2

## 2015-05-13 MED ORDER — DIAZEPAM 5 MG PO TABS
5.0000 mg | ORAL_TABLET | Freq: Four times a day (QID) | ORAL | Status: DC | PRN
  Administered 2015-05-14: 5 mg via ORAL
  Filled 2015-05-13: qty 1

## 2015-05-13 MED ORDER — SUCCINYLCHOLINE CHLORIDE 20 MG/ML IJ SOLN
INTRAMUSCULAR | Status: AC
  Filled 2015-05-13: qty 1

## 2015-05-13 MED ORDER — ACETAMINOPHEN 325 MG PO TABS: 650.0000 mg | ORAL_TABLET | Freq: Four times a day (QID) | ORAL | Status: DC | PRN

## 2015-05-13 MED ORDER — ONDANSETRON HCL 4 MG/2ML IJ SOLN
INTRAMUSCULAR | Status: AC
  Filled 2015-05-13: qty 2

## 2015-05-13 MED ORDER — ROCURONIUM BROMIDE 100 MG/10ML IV SOLN
INTRAVENOUS | Status: DC | PRN
  Administered 2015-05-13: 40 mg via INTRAVENOUS

## 2015-05-13 MED ORDER — ONDANSETRON HCL 4 MG/2ML IJ SOLN: 4.0000 mg | Freq: Four times a day (QID) | INTRAMUSCULAR | Status: DC | PRN

## 2015-05-13 MED ORDER — LISINOPRIL 20 MG PO TABS
20.0000 mg | ORAL_TABLET | Freq: Every day | ORAL | Status: DC
  Administered 2015-05-13 – 2015-05-14 (×2): 20 mg via ORAL
  Filled 2015-05-13 (×3): qty 1

## 2015-05-13 MED ORDER — SENNOSIDES-DOCUSATE SODIUM 8.6-50 MG PO TABS: 1.0000 | ORAL_TABLET | Freq: Every evening | ORAL | Status: DC | PRN

## 2015-05-13 MED ORDER — ASPIRIN EC 325 MG PO TBEC: 325.0000 mg | DELAYED_RELEASE_TABLET | Freq: Two times a day (BID) | ORAL | Status: DC

## 2015-05-13 MED ORDER — ASPIRIN EC 325 MG PO TBEC
325.0000 mg | DELAYED_RELEASE_TABLET | Freq: Every day | ORAL | Status: DC
  Administered 2015-05-14 – 2015-05-15 (×2): 325 mg via ORAL
  Filled 2015-05-13 (×2): qty 1

## 2015-05-13 SURGICAL SUPPLY — 50 items
BLADE SAGITTAL 25.0X1.19X90 (BLADE) ×2 IMPLANT
BLADE SAGITTAL 25.0X1.19X90MM (BLADE) ×1
BLADE SAW SGTL MED 73X18.5 STR (BLADE) ×3 IMPLANT
BLADE SURG ROTATE 9660 (MISCELLANEOUS) IMPLANT
CAPT HIP TOTAL 2 ×3 IMPLANT
COVER PERINEAL POST (MISCELLANEOUS) ×3 IMPLANT
COVER SURGICAL LIGHT HANDLE (MISCELLANEOUS) ×3 IMPLANT
DRAPE C-ARM 42X72 X-RAY (DRAPES) ×3 IMPLANT
DRAPE IMP U-DRAPE 54X76 (DRAPES) ×3 IMPLANT
DRAPE STERI IOBAN 125X83 (DRAPES) ×3 IMPLANT
DRAPE U-SHAPE 47X51 STRL (DRAPES) ×9 IMPLANT
DRSG AQUACEL AG ADV 3.5X10 (GAUZE/BANDAGES/DRESSINGS) ×3 IMPLANT
DURAPREP 26ML APPLICATOR (WOUND CARE) ×3 IMPLANT
ELECT BLADE 4.0 EZ CLEAN MEGAD (MISCELLANEOUS)
ELECT CAUTERY BLADE 6.4 (BLADE) ×3 IMPLANT
ELECT REM PT RETURN 9FT ADLT (ELECTROSURGICAL) ×3
ELECTRODE BLDE 4.0 EZ CLN MEGD (MISCELLANEOUS) IMPLANT
ELECTRODE REM PT RTRN 9FT ADLT (ELECTROSURGICAL) ×1 IMPLANT
FACESHIELD WRAPAROUND (MASK) ×6 IMPLANT
GLOVE BIO SURGEON STRL SZ7.5 (GLOVE) ×3 IMPLANT
GLOVE BIO SURGEON STRL SZ8 (GLOVE) ×6 IMPLANT
GLOVE BIOGEL PI IND STRL 8 (GLOVE) ×2 IMPLANT
GLOVE BIOGEL PI INDICATOR 8 (GLOVE) ×4
GOWN STRL REUS W/ TWL LRG LVL3 (GOWN DISPOSABLE) ×1 IMPLANT
GOWN STRL REUS W/ TWL XL LVL3 (GOWN DISPOSABLE) ×2 IMPLANT
GOWN STRL REUS W/TWL LRG LVL3 (GOWN DISPOSABLE) ×2
GOWN STRL REUS W/TWL XL LVL3 (GOWN DISPOSABLE) ×4
KIT BASIN OR (CUSTOM PROCEDURE TRAY) ×3 IMPLANT
KIT ROOM TURNOVER OR (KITS) ×3 IMPLANT
LINER BOOT UNIVERSAL DISP (MISCELLANEOUS) ×3 IMPLANT
MANIFOLD NEPTUNE II (INSTRUMENTS) ×3 IMPLANT
NS IRRIG 1000ML POUR BTL (IV SOLUTION) ×3 IMPLANT
PACK TOTAL JOINT (CUSTOM PROCEDURE TRAY) ×3 IMPLANT
PACK UNIVERSAL I (CUSTOM PROCEDURE TRAY) ×3 IMPLANT
PAD ARMBOARD 7.5X6 YLW CONV (MISCELLANEOUS) ×6 IMPLANT
SAW OSC TIP CART 19.5X105X1.3 (SAW) IMPLANT
SUT ETHIBOND NAB CT1 #1 30IN (SUTURE) ×6 IMPLANT
SUT VIC AB 0 CT1 27 (SUTURE)
SUT VIC AB 0 CT1 27XBRD ANBCTR (SUTURE) IMPLANT
SUT VIC AB 1 CT1 27 (SUTURE) ×2
SUT VIC AB 1 CT1 27XBRD ANBCTR (SUTURE) ×1 IMPLANT
SUT VIC AB 2-0 CT1 27 (SUTURE) ×2
SUT VIC AB 2-0 CT1 TAPERPNT 27 (SUTURE) ×1 IMPLANT
SUT VIC AB 3-0 PS2 18 (SUTURE)
SUT VIC AB 3-0 PS2 18XBRD (SUTURE) IMPLANT
TOWEL OR 17X24 6PK STRL BLUE (TOWEL DISPOSABLE) ×3 IMPLANT
TOWEL OR 17X26 10 PK STRL BLUE (TOWEL DISPOSABLE) ×6 IMPLANT
TRAY FOLEY CATH 14FR (SET/KITS/TRAYS/PACK) IMPLANT
WATER STERILE IRR 1000ML POUR (IV SOLUTION) IMPLANT
YANKAUER SUCT BULB TIP NO VENT (SUCTIONS) ×3 IMPLANT

## 2015-05-13 NOTE — Anesthesia Postprocedure Evaluation (Signed)
  Anesthesia Post-op Note  Patient: Kathy Coleman  Procedure(s) Performed: Procedure(s): TOTAL HIP ARTHROPLASTY ANTERIOR APPROACH (Right)  Patient Location: PACU  Anesthesia Type:General  Level of Consciousness: awake and alert   Airway and Oxygen Therapy: Patient Spontanous Breathing  Post-op Pain: mild  Post-op Assessment: Post-op Vital signs reviewed     RLE Motor Response: Purposeful movement RLE Sensation: Full sensation      Post-op Vital Signs: Reviewed  Last Vitals:  Filed Vitals:   05/13/15 1138  BP: 133/61  Pulse: 58  Temp: 36.6 C  Resp: 16    Complications: No apparent anesthesia complications

## 2015-05-13 NOTE — Anesthesia Procedure Notes (Signed)
Procedure Name: Intubation Date/Time: 05/13/2015 7:29 AM Performed by: Faustino CongressWHITE,  TENA  Pre-anesthesia Checklist: Patient identified, Emergency Drugs available, Suction available and Patient being monitored Patient Re-evaluated:Patient Re-evaluated prior to inductionOxygen Delivery Method: Circle system utilized Preoxygenation: Pre-oxygenation with 100% oxygen Intubation Type: IV induction Ventilation: Mask ventilation without difficulty Laryngoscope Size: Mac and 3 Grade View: Grade II Tube type: Oral Tube size: 7.0 mm Number of attempts: 1 Airway Equipment and Method: Stylet Placement Confirmation: ETT inserted through vocal cords under direct vision,  positive ETCO2 and breath sounds checked- equal and bilateral Secured at: 22 cm Tube secured with: Tape Dental Injury: Teeth and Oropharynx as per pre-operative assessment

## 2015-05-13 NOTE — Interval H&P Note (Signed)
History and Physical Interval Note:  05/13/2015 7:11 AM  Kathy Coleman  has presented today for surgery, with the diagnosis of RIGHT HIP DEGENERATIVE JOINT DISEASE  The various methods of treatment have been discussed with the patient and family. After consideration of risks, benefits and other options for treatment, the patient has consented to  Procedure(s): TOTAL HIP ARTHROPLASTY ANTERIOR APPROACH (Right) as a surgical intervention .  The patient's history has been reviewed, patient examined, no change in status, stable for surgery.  I have reviewed the patient's chart and labs.  Questions were answered to the patient's satisfaction.     Nestor LewandowskyOWAN, J

## 2015-05-13 NOTE — Transfer of Care (Signed)
Immediate Anesthesia Transfer of Care Note  Patient: Kathy Coleman  Procedure(s) Performed: Procedure(s): TOTAL HIP ARTHROPLASTY ANTERIOR APPROACH (Right)  Patient Location: PACU  Anesthesia Type:General  Level of Consciousness: awake, alert , oriented and patient cooperative  Airway & Oxygen Therapy: Patient Spontanous Breathing and Patient connected to nasal cannula oxygen  Post-op Assessment: Report given to RN and Post -op Vital signs reviewed and stable  Post vital signs: Reviewed and stable  Last Vitals:  Filed Vitals:   05/13/15 1003  BP: 120/66  Pulse: 76  Temp: 37.1 C  Resp: 12    Complications: No apparent anesthesia complications

## 2015-05-13 NOTE — Progress Notes (Signed)
Utilization review completed.  

## 2015-05-13 NOTE — Evaluation (Signed)
Physical Therapy Evaluation Patient Details Name: Kathy MediciMyra L Coleman MRN: [id number] DOB: [birth year] Today's Date: 05/13/2015   History of Present Illness  pt admitted for R THA direct anterior approach. PMHx of 3 back surgeries, 2 neck surgeries, OA, HTN  Clinical Impression  Pt motivated and moving well for POD#0. Pt educated for activity regulation for first day as well. Pt educated for HEP with handout provided. Pt will benefit from acute therapy to maximize mobility, function, gait and HEP to decrease burden of care.     Follow Up Recommendations Home health PT    Equipment Recommendations  None recommended by PT    Recommendations for Other Services       Precautions / Restrictions Precautions Precautions: Fall Restrictions Weight Bearing Restrictions: Yes RLE Weight Bearing: Weight bearing as tolerated      Mobility  Bed Mobility Overal bed mobility: Needs Assistance Bed Mobility: Supine to Sit     Supine to sit: Supervision     General bed mobility comments: cues for sequence with increased time  Transfers Overall transfer level: Needs assistance   Transfers: Sit to/from Stand Sit to Stand: Min guard         General transfer comment: cues for hand and RLE placement with transfers  Ambulation/Gait Ambulation/Gait assistance: Min guard Ambulation Distance (Feet): 50 Feet Assistive device: Rolling walker (2 wheeled) Gait Pattern/deviations: Step-to pattern;Antalgic   Gait velocity interpretation: Below normal speed for age/gender General Gait Details: cues for posture and sequence. Pt walked 15' to toilet then an additional 3350'  Stairs            Wheelchair Mobility    Modified Rankin (Stroke Patients Only)       Balance                                             Pertinent Vitals/Pain Pain Assessment: 0-10 Pain Score: 6  Pain Location: right hip Pain Descriptors / Indicators: Aching Pain Intervention(s): Limited  activity within patient's tolerance;Monitored during session;Repositioned;Ice applied  95% on RA HR 93    Home Living Family/patient expects to be discharged to:: Private residence Living Arrangements: Spouse/significant other Available Help at Discharge: Family;Available 24 hours/day;Friend(s) Type of Home: House Home Access: Level entry     Home Layout: One level Home Equipment: Walker - 2 wheels;Hospital bed;Shower seat;Grab bars - tub/shower;Adaptive equipment;Cane - single point      Prior Function Level of Independence: Needs assistance   Gait / Transfers Assistance Needed: independent  ADL's / Homemaking Assistance Needed: assist with socks and shoes        Hand Dominance        Extremity/Trunk Assessment   Upper Extremity Assessment: Overall WFL for tasks assessed           Lower Extremity Assessment: RLE deficits/detail RLE Deficits / Details: limited as expected post op    Cervical / Trunk Assessment: Normal  Communication   Communication: No difficulties  Cognition Arousal/Alertness: Awake/alert Behavior During Therapy: WFL for tasks assessed/performed Overall Cognitive Status: Within Functional Limits for tasks assessed                      General Comments      Exercises Total Joint Exercises Ankle Circles/Pumps: AROM;Right;5 reps;Supine Heel Slides: AAROM;Right;5 reps;Supine Hip ABduction/ADduction: AROM;Seated;Right;5 reps Long Arc Quad: AROM;Seated;Right;5 reps  Assessment/Plan    PT Assessment Patient needs continued PT services  PT Diagnosis Difficulty walking;Acute pain   PT Problem List Decreased strength;Decreased activity tolerance;Decreased balance;Pain;Decreased knowledge of use of DME  PT Treatment Interventions DME instruction;Gait training;Functional mobility training;Therapeutic activities;Therapeutic exercise;Patient/family education   PT Goals (Current goals can be found in the Care Plan section) Acute  Rehab PT Goals Patient Stated Goal: return to cleaning the house PT Goal Formulation: With patient/family Time For Goal Achievement: 05/20/15 Potential to Achieve Goals: Good    Frequency 7X/week   Barriers to discharge        Co-evaluation               End of Session   Activity Tolerance: Patient tolerated treatment well Patient left: in chair;with call bell/phone within reach;with family/visitor present Nurse Communication: Mobility status;Weight bearing status         Time: 1610-9604 PT Time Calculation (min) (ACUTE ONLY): 20 min   Charges:   PT Evaluation $Initial PT Evaluation Tier I: 1 Procedure     PT G CodesDelorse Lek 05/13/2015, 1:36 PM Delaney Meigs, PT [phone]

## 2015-05-13 NOTE — Op Note (Signed)
OPERATIVE REPORT    DATE OF PROCEDURE:  05/13/2015       PREOPERATIVE DIAGNOSIS:  RIGHT HIP DEGENERATIVE JOINT DISEASE                                                          POSTOPERATIVE DIAGNOSIS:  RIGHT HIP DEGENERATIVE JOINT DISEASE                                                           PROCEDURE: Anterior R total hip arthroplasty using a 50 mm DePuy Pinnacle  Cup, [place] [employer] Hole Eliminator, 0-degree polyethylene liner, a +0 36 mm ceramic head, a 50 Depuy Triloc stem   SURGEON: , J    ASSISTANT:   Eric K. [employer] PA-C  (present throughout entire procedure and necessary for timely completion of the procedure)   ANESTHESIA: GET BLOOD LOSS: 400 FLUID REPLACEMENT: 1600 crystalloid Antibiotic: 2gm ancef Tranexamic Acid: 1gm iv COMPLICATIONS: none    INDICATIONS FOR PROCEDURE: A 59 y.o. year-old With  RIGHT HIP DEGENERATIVE JOINT DISEASE   for 4 years, x-rays show bone-on-bone arthritic changes. Despite conservative measures with observation, anti-inflammatory medicine, narcotics, use of a cane, has severe unremitting pain and can ambulate only a few blocks before resting. Patient desires elective R total hip arthroplasty to decrease pain and increase function. The risks, benefits, and alternatives were discussed at length including but not limited to the risks of infection, bleeding, nerve injury, stiffness, blood clots, the need for revision surgery, cardiopulmonary complications, among others, and they were willing to proceed. Questions answered     PROCEDURE IN DETAIL: The patient was identified by armband,  [facility] preoperative [facility], taken to the operating room , appropriate anesthetic monitors  were attached and spinal anesthesia was induced with the patienton the gurney. The HANA boots were applied to the feet and he was then transferred to the HANA table with a peroneal post and support underneath the L leg which  was locked in extension. The right lower extremity was then prepped and draped in the usual sterile fashion from just above the iliac crest to the knee. And a timeout procedure was performed. We then made a 12 cm incision along the interval at the leading edge of the tensor fascia lata of starting at 2 cm lateral to and 2 cm distal to the ASIS. Small bleeders in the skin and subcutaneous tissue identified and cauterized we dissected down to the fascia and made an incision in the fascia allowing us to elevate the fascia of the tensor muscle and exploited the interval between the rectus and the tensor fascia lot of. A Hohmann retractor was then placed along the superior neck of the femur and a Cobra retractor along the inferior neck of the femur we teed the capsule starting out at the superior anterior aspect of the acetabulum going distally and made the T along the neck both leaflets of the T were tagged with #2 Ethibond suture. Cobra retractors were then placed along the inferior and superior neck allowing us to perform a standard neck cut  and removed the femoral head with a power corkscrew. We then placed a right angle Hohmann retractor along the anterior aspect of the acetabulum a spiked Cobra in the cotyloid notch and posteriorly another spiked Cobra. We then sequentially reamed up to a 49 mm basket reamer obtaining good coverage in all quadrants and this is verified by C-arm imaging. Under C-arm control with and hammered into place a 54 mm Pinnacle cup in 45 of abduction and 15 of anteversion. The cup seated nicely and required no supplemental screws. We then placed a central hole Eliminator and a 0 polyethylene liner to accept a 36 mm head. The foot was then externally rotated to 100, the HANA elevator was placed around the flare of the greater trochanter and the limb was extended and abducted delivering the proximal femur up into the wound. A medium Hohmann retractor was placed over the greater trochanter  and a Mueller retractor along the posterior femoral neck completing the exposure. We then performed releases superiorly and and inferiorly of the capsule going back to the pirformis fossa superiorly and to the lesser trochanter inferiorly. We then entered the proximal femur with the box cutting offset chisel followed by the chili pepper and broaching up to a 6 broach obtaining excellent fit and fill and good stability. Trial reduction was performed the limb lengths were excellent the hip was stable in 90 of external rotation. At this point the trial components removed and we hammered into place a #6 Tri-Lock stem with Gryption coating. This is a high offset stem and a +0 36 mm ceramic ball was then hammered into place the hip was reduced and final C-arm images obtained. The wound is thoroughly irrigated with normal saline solution we then packed the sponge with Trans Am a casted into the wound and closed the fascia of the tensor fascia lot a with running the lock suture the subcutaneous tissue was closed with 2-0 and 3-0 Vicryl suture followed by an Aquasol dressing. At this point the patient was transferred to hospital gurney without difficulty. The subcutaneous  tissue with 0 and 2-0 undyed Vicryl suture and the skin with running  3-0 vicryl subcuticular suture. Aquacil dressing was applied. The patient was then unclamped, rolled supine, awaken extubated and taken to recovery room without difficulty in stable condition.   , J 05/13/2015, 9:30 AM

## 2015-05-13 NOTE — Evaluation (Signed)
Occupational Therapy Evaluation Patient Details Name: Kathy Coleman MRN: [id number] DOB: Sep 16, 1955 Today's Date: 05/13/2015    History of Present Illness pt admitted for R THA direct anterior approach. PMHx of 3 back surgeries, 2 neck surgeries, OA, HTN   Clinical Impression   Pt reports she was independent with ADLs PTA. Currently pt is overall min guard for ADLs and functional mobility with the exception of min A for LB ADLs. Began ADL and home safety education; pt verbalized understanding. Pt planning to d/c home with 24/7 supervision from her husband. Pt would benefit from continued skilled OT in order to increase independence and safety with LB ADLs with use of AE prior to return home.     Follow Up Recommendations  No OT follow up;Supervision - Intermittent    Equipment Recommendations  Other (comment) (AE: reacher, sock aide, lh sponge, lh shoe horn)    Recommendations for Other Services       Precautions / Restrictions Precautions Precautions: Fall Restrictions Weight Bearing Restrictions: Yes RLE Weight Bearing: Weight bearing as tolerated      Mobility Bed Mobility Overal bed mobility: Needs Assistance Bed Mobility: Supine to Sit;Sit to Supine     Supine to sit: Min guard Sit to supine: Min assist   General bed mobility comments: Min A to guide RLE into bed   Transfers Overall transfer level: Needs assistance Equipment used: Rolling walker (2 wheeled) Transfers: Sit to/from Stand Sit to Stand: Min guard         General transfer comment: Min guard for safety. Good hand placement and technique. Sit <> stand from EOB x1    Balance Overall balance assessment: Needs assistance Sitting-balance support: No upper extremity supported Sitting balance-Leahy Scale: Good     Standing balance support: Bilateral upper extremity supported Standing balance-Leahy Scale: Poor Standing balance comment: RW for support                            ADL  Overall ADL's : Needs assistance/impaired Eating/Feeding: Set up;Sitting   Grooming: Min guard;Standing       Lower Body Bathing: Minimal assistance;Sit to/from stand       Lower Body Dressing: Minimal assistance;Sit to/from stand Lower Body Dressing Details (indicate cue type and reason): Educated pt and husband on compensatory strategies for LB ADLs. Toilet Transfer: Min guard;Ambulation;RW;BSC (BSC over toilet)       Tub/ Shower Transfer: Min guard;Ambulation;Rolling walker Tub/Shower Transfer Details (indicate cue type and reason): Educated pt and husband on walk in shower transfer technique; pt return demonstrated understanding Functional mobility during ADLs: Min guard;Rolling walker General ADL Comments: Husband and son present for OT eval. Educated pt on home safety, need for close supervision during ADLs and functional mobility, use of shower chair for safety; pt verbalized understanding.     Vision     Perception     Praxis      Pertinent Vitals/Pain Pain Assessment: Faces Pain Score: 6  Faces Pain Scale: Hurts even more Pain Location: R hip  Pain Descriptors / Indicators: Aching;Sore Pain Intervention(s): Limited activity within patient's tolerance;Monitored during session;Ice applied     Hand Dominance Right   Extremity/Trunk Assessment Upper Extremity Assessment Upper Extremity Assessment: Overall WFL for tasks assessed   Lower Extremity Assessment Lower Extremity Assessment: Defer to PT evaluation RLE Deficits / Details: limited as expected post op   Cervical / Trunk Assessment Cervical / Trunk Assessment: Normal   Communication Communication Communication:  No difficulties   Cognition Arousal/Alertness: Awake/alert Behavior During Therapy: WFL for tasks assessed/performed Overall Cognitive Status: Within Functional Limits for tasks assessed                     General Comments       Exercises       Shoulder Instructions       Home Living Family/patient expects to be discharged to:: Private residence Living Arrangements: Spouse/significant other Available Help at Discharge: Family;Available 24 hours/day;Friend(s) Type of Home: House Home Access: Level entry     Home Layout: One level     Bathroom Shower/Tub: Walk-in shower;Door   [employer] Toilet: Handicapped height Bathroom Accessibility: Yes How Accessible: Accessible via walker Home Equipment: Walker - 2 wheels;Hospital bed;Shower seat;Grab bars - tub/shower;Adaptive equipment;Cane - single point;Bedside commode Adaptive Equipment: Reacher        Prior Functioning/Environment Level of Independence: Needs assistance  Gait / Transfers Assistance Needed: independent ADL's / Homemaking Assistance Needed: Husband assisted with LB ADLs PTA        OT Diagnosis: Acute pain   OT Problem List: Impaired balance (sitting and/or standing);Decreased knowledge of use of DME or AE;Decreased knowledge of precautions;Pain   OT Treatment/Interventions: Self-care/ADL training;DME and/or AE instruction;Patient/family education    OT Goals(Current goals can be found in the care plan section) Acute Rehab OT Goals Patient Stated Goal: return to independence OT Goal Formulation: With patient Time For Goal Achievement: 05/27/15 Potential to Achieve Goals: Good ADL Goals Pt Will Perform Lower Body Bathing: with supervision;with adaptive equipment;sit to/from stand Pt Will Perform Lower Body Dressing: with supervision;with adaptive equipment;sit to/from stand  OT Frequency: Min 2X/week   Barriers to D/C:            Co-evaluation              End of Session Equipment Utilized During Treatment: Gait belt;Rolling walker  Activity Tolerance: Patient tolerated treatment well Patient left: in bed;with call bell/phone within reach;with family/visitor present;with SCD's reapplied   Time: 1450-1504 OT Time Calculation (min): 14 min Charges:  OT General  Charges $OT Visit: 1 Procedure OT Evaluation $Initial OT Evaluation Tier I: 1 Procedure G-Codes:     Gaye Alken M.S., OTR/L Pager: [phone]  05/13/2015, 4:05 PM

## 2015-05-13 NOTE — Discharge Instructions (Signed)

## 2015-05-14 ENCOUNTER — Encounter (HOSPITAL_COMMUNITY): Payer: Self-pay | Admitting: Orthopedic Surgery

## 2015-05-14 LAB — CBC
HEMATOCRIT: 31 % — AB (ref 36.0–46.0)
HEMOGLOBIN: 10.1 g/dL — AB (ref 12.0–15.0)
MCH: 30.1 pg (ref 26.0–34.0)
MCHC: 32.6 g/dL (ref 30.0–36.0)
MCV: 92.5 fL (ref 78.0–100.0)
Platelets: 173 10*3/uL (ref 150–400)
RBC: 3.35 MIL/uL — AB (ref 3.87–5.11)
RDW: 12.5 % (ref 11.5–15.5)
WBC: 4.8 10*3/uL (ref 4.0–10.5)

## 2015-05-14 NOTE — Progress Notes (Signed)
Physical Therapy Treatment Patient Details Name: Kathy Coleman MRN: [id number] DOB: 25-Feb-1956 Today's Date: 05/14/2015    History of Present Illness pt admitted for R THA direct anterior approach. PMHx of 3 back surgeries, 2 neck surgeries, OA, HTN    PT Comments    Pt up on her own in the chair this morning. She reports sleeping well and being sore. Able to increase gait distance and HEp today. Encouraged HEp and increased gait with staff throughout the day. Will continue to follow.   Follow Up Recommendations  Home health PT     Equipment Recommendations  None recommended by PT    Recommendations for Other Services       Precautions / Restrictions Precautions Precautions: Fall Restrictions RLE Weight Bearing: Weight bearing as tolerated    Mobility  Bed Mobility               General bed mobility comments: in recliner on arrival  Transfers Overall transfer level: Needs assistance   Transfers: Sit to/from Stand Sit to Stand: Supervision         General transfer comment: cues for RLE position only  Ambulation/Gait Ambulation/Gait assistance: Supervision Ambulation Distance (Feet): 115 Feet Assistive device: Rolling walker (2 wheeled) Gait Pattern/deviations: Step-through pattern;Decreased stride length;Antalgic   Gait velocity interpretation: Below normal speed for age/gender General Gait Details: cues for posture. Pt self regulating distance   Stairs            Wheelchair Mobility    Modified Rankin (Stroke Patients Only)       Balance                                    Cognition Arousal/Alertness: Awake/alert Behavior During Therapy: WFL for tasks assessed/performed Overall Cognitive Status: Within Functional Limits for tasks assessed                      Exercises Total Joint Exercises Gluteal Sets: AROM;Seated;Both;10 reps Hip ABduction/ADduction: AROM;Seated;Right;10 reps Long Arc Quad:  AROM;Seated;Right;10 reps Marching in Standing: AROM;Seated;Right;10 reps    General Comments        Pertinent Vitals/Pain Pain Score: 4  Pain Location: righ thigh Pain Descriptors / Indicators: Cramping;Tightness Pain Intervention(s): Limited activity within patient's tolerance;Monitored during session;RN gave pain meds during session;Repositioned;Ice applied    Home Living                      Prior Function            PT Goals (current goals can now be found in the care plan section) Progress towards PT goals: Progressing toward goals    Frequency       PT Plan Current plan remains appropriate    Co-evaluation             End of Session   Activity Tolerance: Patient tolerated treatment well Patient left: in chair;with call bell/phone within reach     Time: 0724-0741 PT Time Calculation (min) (ACUTE ONLY): 17 min  Charges:  $Gait Training: 8-22 mins                    G Codes:      Delorse Lekabor,  Beth 05/14/2015, 7:45 AM Delaney Meigs [place] , PT [phone]

## 2015-05-14 NOTE — Progress Notes (Signed)
Occupational Therapy Treatment Patient Details Name: Kathy Coleman MRN: [id number] DOB: March 12, 1956 Today's Date: 05/14/2015    History of present illness pt admitted for R THA direct anterior approach. PMHx of 3 back surgeries, 2 neck surgeries, OA, HTN   OT comments  Pt making great progress toward OT goals. Educated and demonstrated use of AE for increased independence with LB ADLs; pt able to return demonstrate use of reacher and sock aide, able to verbalize understanding of long handled shoe horn and long handled sponge. Pt reports her husband has purchased AE and has it ready for her at home. D/c plan remains appropriate at this time. Pt ready to d/c home from an OT standpoint. Will continue to follow acutely.   Follow Up Recommendations  No OT follow up;Supervision - Intermittent    Equipment Recommendations  Other (comment) (AE: reacher, sock aide, lh sponge, lh shoe horn)    Recommendations for Other Services      Precautions / Restrictions Precautions Precautions: Fall Restrictions Weight Bearing Restrictions: Yes RLE Weight Bearing: Weight bearing as tolerated       Mobility Bed Mobility Overal bed mobility: Needs Assistance Bed Mobility: Supine to Sit     Supine to sit: Supervision     General bed mobility comments: Supervision for safety, no physical assist needed. Use of rails. Increased time needed.  Transfers Overall transfer level: Needs assistance Equipment used: Rolling walker (2 wheeled) Transfers: Sit to/from Stand Sit to Stand: Supervision         General transfer comment: cues for RLE position only    Balance                                   ADL Overall ADL's : Needs assistance/impaired             Lower Body Bathing: Supervison/ safety Lower Body Bathing Details (indicate cue type and reason): Educated and demonstrated use of long handled sponge; pt verbalized understanding.      Lower Body Dressing:  Supervision/safety;Sit to/from stand Lower Body Dressing Details (indicate cue type and reason): Educated and demonstrated use of long handled shoe horn, reacher, and sock aide; pt verbalized understanding of long handled shoe horn, return demonstrated use of reacher and sock aide to doff/don socks.             Functional mobility during ADLs: Supervision/safety;Rolling walker General ADL Comments: Husband present for OT session. Educated on use of AE for increased independence with LB ADLs; pt and husband with no further questions or concerns.       Vision                     Perception     Praxis      Cognition   Behavior During Therapy: [facility] for tasks assessed/performed Overall Cognitive Status: Within Functional Limits for tasks assessed                       Extremity/Trunk Assessment               Exercises Total Joint Exercises Hip ABduction/ADduction: AROM;Right;15 reps;Standing Straight Leg Raises: AROM;Seated;Left;10 reps Long Arc Quad: AROM;Seated;15 reps;Both Marching in Standing: AROM;Both;15 reps   Shoulder Instructions       General Comments      Pertinent Vitals/ Pain       Pain Assessment: 0-10 Pain Score: 5  Pain Location: R hip Pain Descriptors / Indicators: Grimacing;Sore Pain Intervention(s): Limited activity within patient's tolerance;Monitored during session;Ice applied;Patient requesting pain meds-RN notified;RN gave pain meds during session  Home Living                                          Prior Functioning/Environment              Frequency Min 2X/week     Progress Toward Goals  OT Goals(current goals can now be found in the care plan section)  Progress towards OT goals: Progressing toward goals  Acute Rehab OT Goals Patient Stated Goal: to go home OT Goal Formulation: With patient  Plan Discharge plan remains appropriate    Co-evaluation                 End of Session  Equipment Utilized During Treatment: Rolling walker   Activity Tolerance Patient tolerated treatment well   Patient Left with nursing/sitter in room;with family/visitor present;Other (comment) (in bathroom with husband)   Nurse Communication Patient requests pain meds        Time: 1478-2956 OT Time Calculation (min): 16 min  Charges: OT General Charges $OT Visit: 1 Procedure OT Treatments $Self Care/Home Management : 8-22 mins  Gaye Alken M.S., OTR/L Pager: [phone]  05/14/2015, 4:03 PM

## 2015-05-14 NOTE — Care Management Note (Signed)
Case Management Note  Patient Details  Name: Kathy MediciMyra L Coleman MRN: [id number] Date of Birth: 22-Oct-1955  Subjective/Objective:          S/p right total hip arthroplasty          Action/Plan: Spoke with patient about discharge plan. [facility] Linda at [facility] and set up HHPT. Faxed order, face to face, op note, H and P and PT note to [phone]. Received fax confirmation. Patient states that she has a rolling walker, 3N1, cane and hospital bed. No equipment needs identified. Patient states that she will have family to assist her at home.      Expected Discharge Date:                  Expected Discharge Plan:  Home w Home Health Services  In-House Referral:  NA  Discharge planning Services  CM Consult  Post Acute Care Choice:  Home Health Choice offered to:  Patient  DME Arranged:    DME Agency:     HH Arranged:  PT HH Agency:  [facility]  Status of Service:  Completed, signed off  Medicare Important Message Given:    Date Medicare IM Given:    Medicare IM give by:    Date Additional Medicare IM Given:    Additional Medicare Important Message give by:     If discussed at Long Length of Stay Meetings, dates discussed:    Additional Comments:  Monica BectonKrieg,  Watson, RN 05/14/2015, 1:35 PM

## 2015-05-14 NOTE — Progress Notes (Signed)
Patient ID: Kathy MediciMyra L Coleman, female   DOB: 1956/01/06, 59 y.o.   MRN: [id number] PATIENT ID: Kathy Coleman  MRN: [id number]  DOB/AGE:  1956/01/06 / 59 y.o.  1 Day Post-Op Procedure(s) (LRB): TOTAL HIP ARTHROPLASTY ANTERIOR APPROACH (Right)    PROGRESS NOTE Subjective: Patient is alert, oriented, no Nausea, no Vomiting, yes passing gas, no Bowel Movement. Taking PO well. Denies SOB, Chest or Calf Pain. Using Incentive Spirometer, PAS in place. Ambulate WBAT Patient reports pain as 4 on 0-10 scale  .    Objective: Vital signs in last 24 hours: Filed Vitals:   05/13/15 1138 05/13/15 1329 05/13/15 1942 05/14/15 0519  BP: 133/61  120/62 93/52  Pulse: 58 85 83 80  Temp: 97.8 F (36.6 C)  98.1 F (36.7 C) 98.2 F (36.8 C)  TempSrc: Oral  Oral Oral  Resp: 16  16 16   SpO2: 100% 95% 100% 98%      Intake/Output from previous day: I/O last 3 completed shifts: In: 1340 [P.O.:240; I.V.:1100] Out: 401 [Urine:1; Blood:400]   Intake/Output this shift:     LABORATORY DATA:  Recent Labs  05/14/15 0318  WBC 4.8  HGB 10.1*  HCT 31.0*  PLT 173    Examination: Neurologically intact ABD soft Neurovascular intact Sensation intact distally Intact pulses distally Dorsiflexion/Plantar flexion intact Incision: no drainage No cellulitis present Compartment soft} XR AP&Lat of hip shows well placed\fixed THA  Assessment:   1 Day Post-Op Procedure(s) (LRB): TOTAL HIP ARTHROPLASTY ANTERIOR APPROACH (Right) ADDITIONAL DIAGNOSIS:  Expected Acute Blood Loss Anemia, Hypertension  Plan: PT/OT WBAT, THA  posterior precautions  DVT Prophylaxis: SCDx72 hrs, ASA 325 mg BID x 2 weeks  DISCHARGE PLAN: Home, when passes PT  DISCHARGE NEEDS: HHPT, Walker and 3-in-1 comode seat

## 2015-05-14 NOTE — Progress Notes (Signed)
Physical Therapy Treatment Patient Details Name: Kathy Coleman MRN: [id number] DOB: 09/14/1955 Today's Date: 05/14/2015    History of Present Illness pt admitted for R THA direct anterior approach. PMHx of 3 back surgeries, 2 neck surgeries, OA, HTN    PT Comments    Pt with continued increase in gait distance and HEP repetitions. Pt with popping and grinding of left knee with standing and states she plans on TKA in 6 weeks when cleared from THA. Pt educated for LAQ and SLR exercises for LLE for general strengthening. Pt is moving well and is anxious for D/C tomorrow. Will continue to follow.   Follow Up Recommendations  Home health PT     Equipment Recommendations       Recommendations for Other Services       Precautions / Restrictions Precautions Precautions: Fall Restrictions RLE Weight Bearing: Weight bearing as tolerated    Mobility  Bed Mobility Overal bed mobility: Needs Assistance Bed Mobility: Supine to Sit     Supine to sit: Min assist     General bed mobility comments: required assist to move RLE to EOB with cues  Transfers Overall transfer level: Needs assistance   Transfers: Sit to/from Stand Sit to Stand: Supervision         General transfer comment: cues for RLE position only  Ambulation/Gait Ambulation/Gait assistance: Supervision Ambulation Distance (Feet): 200 Feet Assistive device: Rolling walker (2 wheeled) Gait Pattern/deviations: Step-through pattern;Decreased stride length   Gait velocity interpretation: Below normal speed for age/gender General Gait Details: upright posture and increased stride length   Stairs            Wheelchair Mobility    Modified Rankin (Stroke Patients Only)       Balance                                    Cognition Arousal/Alertness: Awake/alert Behavior During Therapy: WFL for tasks assessed/performed Overall Cognitive Status: Within Functional Limits for tasks assessed                       Exercises Total Joint Exercises Hip ABduction/ADduction: AROM;Right;15 reps;Standing Straight Leg Raises: AROM;Seated;Left;10 reps Long Arc Quad: AROM;Seated;15 reps;Both Marching in Standing: AROM;Both;15 reps    General Comments        Pertinent Vitals/Pain Pain Score: 4  Pain Location: right hip Pain Descriptors / Indicators: Aching Pain Intervention(s): Limited activity within patient's tolerance;Patient requesting pain meds-RN notified;Repositioned    Home Living                      Prior Function            PT Goals (current goals can now be found in the care plan section) Progress towards PT goals: Progressing toward goals    Frequency       PT Plan Current plan remains appropriate    Co-evaluation             End of Session   Activity Tolerance: Patient tolerated treatment well Patient left: in chair;with call bell/phone within reach;with family/visitor present     Time: [id number] PT Time Calculation (min) (ACUTE ONLY): 17 min  Charges:  $Gait Training: 8-22 mins                    G Codes:      Delorse Lekabor,  Beth  05/14/2015, 12:08 PM Delaney Meigs, PT [phone]

## 2015-05-15 LAB — CBC
HEMATOCRIT: 26.9 % — AB (ref 36.0–46.0)
Hemoglobin: 8.9 g/dL — ABNORMAL LOW (ref 12.0–15.0)
MCH: 30.4 pg (ref 26.0–34.0)
MCHC: 33.1 g/dL (ref 30.0–36.0)
MCV: 91.8 fL (ref 78.0–100.0)
PLATELETS: 156 10*3/uL (ref 150–400)
RBC: 2.93 MIL/uL — AB (ref 3.87–5.11)
RDW: 12.6 % (ref 11.5–15.5)
WBC: 8.2 10*3/uL (ref 4.0–10.5)

## 2015-05-15 NOTE — Progress Notes (Signed)
Physical Therapy Evaluation Patient Details Name: Kathy MediciMyra L Guzy MRN: [id number] DOB: 13-Dec-1955 Today's Date: 05/15/2015   History of Present Illness  pt admitted for R THA direct anterior approach. PMHx of 3 back surgeries, 2 neck surgeries, OA, HTN  Clinical Impression  Pt was dressed and ready to go home with husband present. Pt safely transferred from sit to stand independently with a 2 wheeled rolling walker. Pt safely ambulated with minimal pain with the 2 wheel walker in a step through pattern. Pt safely completed stairs with two rails and recited the stair sequence. Pt plans to have an elective total knee replacement in the near future due to OA.   Pt is progressing towards the goal of home. Husband will be present at home to provide care. Continue home exercises.     Follow Up Recommendations Home health PT    Equipment Recommendations       Recommendations for Other Services       Precautions / Restrictions Precautions Precautions: Fall Restrictions Weight Bearing Restrictions: Yes RLE Weight Bearing: Weight bearing as tolerated      Mobility  Bed Mobility                  Transfers Overall transfer level: Modified independent Equipment used: Rolling walker (2 wheeled) Transfers: Sit to/from Stand Sit to Stand: Modified independent (Device/Increase time)         General transfer comment: cues for RLE position only  Ambulation/Gait Ambulation/Gait assistance: Modified independent (Device/Increase time) Ambulation Distance (Feet): 520 Feet Assistive device: Rolling walker (2 wheeled) Gait Pattern/deviations: Step-through pattern;WFL(Within Functional Limits)   Gait velocity interpretation: at or above normal speed for age/gender General Gait Details: upright posture; ambulated with minimal pain; noted bilateral knee valgus; noted right foot toe-in  Stairs Stairs: Yes Stairs assistance: Modified independent (Device/Increase time) Stair Management:  Two rails;Forwards;Step to pattern Number of Stairs: 6 General stair comments: No stairs in home; Pt wished to visit grandson's home with 6 stairs; Pt taught stair sequence   Wheelchair Mobility    Modified Rankin (Stroke Patients Only)       Balance Overall balance assessment: Modified Independent                                           Pertinent Vitals/Pain Pain Assessment: 0-10 Pain Score: 1  Pain Location: R Hip Pain Descriptors / Indicators: Aching;Sore Pain Intervention(s): Monitored during session    Home Living                        Prior Function                 Hand Dominance        Extremity/Trunk Assessment                         Communication      Cognition Arousal/Alertness: Awake/alert Behavior During Therapy: WFL for tasks assessed/performed Overall Cognitive Status: Within Functional Limits for tasks assessed                      General Comments      Exercises        Assessment/Plan    PT Assessment    PT Diagnosis     PT Problem List    PT Treatment Interventions  PT Goals (Current goals can be found in the Care Plan section) Acute Rehab PT Goals Patient Stated Goal: to go home PT Goal Formulation: With patient/family Time For Goal Achievement: 05/20/15 Potential to Achieve Goals: Good    Frequency     Barriers to discharge        Co-evaluation               End of Session   Activity Tolerance: Patient tolerated treatment well Patient left: in chair;with family/visitor present (Pt was ready to go home soon) Nurse Communication: Mobility status         Time: 1005-1015 PT Time Calculation (min) (ACUTE ONLY): 10 min   Charges:         PT G Codes:        Dellia Cloud June 12, 2015, 10:44 AM   Dellia Cloud, SPT Acute Rehab Services [phone]

## 2015-05-15 NOTE — Discharge Summary (Signed)
Patient ID: Kathy Coleman MRN: [id number] DOB/AGE: 1955-07-11 59 y.o.  Admit date: 05/13/2015 Discharge date: 05/15/2015  Admission Diagnoses:  Principal Problem:   Primary osteoarthritis of right hip   Discharge Diagnoses:  Same  Past Medical History  Diagnosis Date  . PONV (postoperative nausea and vomiting)   . Hypertension   . Arthritis     Surgeries: Procedure(s): TOTAL HIP ARTHROPLASTY ANTERIOR APPROACH on 05/13/2015   Consultants:    Discharged Condition: Improved  Hospital Course: Kathy Coleman is an 59 y.o. female who was admitted 05/13/2015 for operative treatment ofPrimary osteoarthritis of right hip. Patient has severe unremitting pain that affects sleep, daily activities, and work/hobbies. After pre-op clearance the patient was taken to the operating room on 05/13/2015 and underwent  Procedure(s): TOTAL HIP ARTHROPLASTY ANTERIOR APPROACH.    Patient was given perioperative antibiotics: Anti-infectives    Start     Dose/Rate Route Frequency Ordered Stop   05/13/15 0600  ceFAZolin (ANCEF) IVPB 2 g/50 mL premix     2 g 100 mL/hr over 30 Minutes Intravenous On call to O.R. 05/12/15 1347 05/13/15 0740       Patient was given sequential compression devices, early ambulation, and chemoprophylaxis to prevent DVT.  Patient benefited maximally from hospital stay and there were no complications.    Recent vital signs: Patient Vitals for the past 24 hrs:  BP Temp Temp src Pulse Resp SpO2  05/15/15 0446 (!) 102/47 mmHg 98 F (36.7 C) - 69 18 96 %  05/14/15 2027 (!) 95/48 mmHg 98.1 F (36.7 C) Oral 75 18 96 %  05/14/15 1831 (!) 99/55 mmHg - - - - -  05/14/15 1828 (!) 87/51 mmHg 98.6 F (37 C) Oral 74 19 98 %  05/14/15 1420 (!) 95/50 mmHg 99.1 F (37.3 C) Oral 74 18 97 %  05/14/15 1223 (!) 102/58 mmHg - - 75 - -     Recent laboratory studies:  Recent Labs  05/14/15 0318 05/15/15 0630  WBC 4.8 8.2  HGB 10.1* 8.9*  HCT 31.0* 26.9*  PLT 173 156      Discharge Medications:     Medication List    TAKE these medications        aspirin EC 325 MG tablet  Take 1 tablet (325 mg total) by mouth 2 (two) times daily.     atorvastatin 20 MG tablet  Commonly known as:  LIPITOR  Take 20 mg by mouth daily.     diazepam 5 MG tablet  Commonly known as:  VALIUM  Take 1 tablet (5 mg total) by mouth every 6 (six) hours as needed for muscle spasms.     ENDOCET 10-325 MG tablet  Generic drug:  oxyCODONE-acetaminophen  Take 1 tablet by mouth every 4 (four) hours as needed. pain     gabapentin 300 MG capsule  Commonly known as:  NEURONTIN  Take 300 mg by mouth 3 (three) times daily.     indomethacin 50 MG capsule  Commonly known as:  INDOCIN  Take 50 mg by mouth 3 (three) times daily with meals.     lisinopril 20 MG tablet  Commonly known as:  PRINIVIL,ZESTRIL  Take 20 mg by mouth daily.     methocarbamol 500 MG tablet  Commonly known as:  ROBAXIN  Take 1 tablet (500 mg total) by mouth 2 (two) times daily with a meal.     traMADol 50 MG tablet  Commonly known as:  ULTRAM  Take 100 mg by  mouth every 8 (eight) hours.        Diagnostic Studies: Dg Chest 2 View  05/03/2015  CLINICAL DATA:  Preoperative examination prior to total hip arthroplasty EXAM: CHEST  2 VIEW COMPARISON:  Report of a chest x-ray dated March 27, 2013 FINDINGS: The lungs are adequately inflated. There is no focal infiltrate. There is no pleural effusion. The heart and pulmonary vascularity are normal. The mediastinum is normal in width. There is mild multilevel degenerative disc disease of the thoracic spine. The patient has undergone previous posterior lumbar fusion and previous lower anterior cervical fusion. IMPRESSION: There is no active cardiopulmonary disease. Electronically Signed   By: David  SwazilandJordan M.D.   On: 05/03/2015 13:41   Dg Pelvis Portable  05/13/2015  CLINICAL DATA:  Postop from right hip arthroplasty. EXAM: PORTABLE PELVIS 1-2 VIEWS  COMPARISON:  None. FINDINGS: On this single view, the femoral and acetabular components of left total right hip arthroplasty are well-seated and aligned. There is no acute fracture or evidence of an operative complication. IMPRESSION: Well-aligned right hip arthroplasty. Electronically Signed   By: Amie Portlandavid  Ormond M.D.   On: 05/13/2015 12:16   Dg Hip Operative Unilat With Pelvis Right  05/13/2015  CLINICAL DATA:  Right hip arthroplasty anterior approach EXAM: OPERATIVE right HIP (WITH PELVIS IF PERFORMED) 2 VIEWS TECHNIQUE: Fluoroscopic spot image(s) were submitted for interpretation post-operatively. FLUOROSCOPY TIME:  28 seconds COMPARISON:  None. FINDINGS: Two fluoroscopic spot images during right total hip arthroplasty. IMPRESSION: Intraoperative fluoroscopic images as above. Electronically Signed   By: Charline BillsSriyesh  Krishnan M.D.   On: 05/13/2015 09:30    Disposition: 01-Home or Self Care      Discharge Instructions    Call MD / Call 911    Complete by:  As directed   If you experience chest pain or shortness of breath, CALL 911 and be transported to the hospital emergency room.  If you develope a fever above 101 F, pus (white drainage) or increased drainage or redness at the wound, or calf pain, call your surgeon's office.     Change dressing    Complete by:  As directed   You may change your dressing on day 5, then change the dressing daily with sterile 4 x 4 inch gauze dressing and paper tape.  You may clean the incision with alcohol prior to redressing     Constipation Prevention    Complete by:  As directed   Drink plenty of fluids.  Prune juice may be helpful.  You may use a stool softener, such as Colace (over the counter) 100 mg twice a day.  Use MiraLax (over the counter) for constipation as needed.     Diet - low sodium heart healthy    Complete by:  As directed      Discharge instructions    Complete by:  As directed   Follow up in office with Dr. Turner Danielsowan in 2 weeks.     Driving  restrictions    Complete by:  As directed   No driving for 2 weeks     Follow the hip precautions as taught in Physical Therapy    Complete by:  As directed      Increase activity slowly as tolerated    Complete by:  As directed      Patient may shower    Complete by:  As directed   You may shower without a dressing once there is no drainage.  Do not wash over the wound.  If drainage remains, cover wound with plastic wrap and then shower.           Follow-up Information    Follow up with [name], MD In 2 weeks.   Specialty:  Orthopedic Surgery   Contact information:   [address] [place] [address] [phone]       Follow up with [facility].   Why:  They will contact you to schedule home therapy visits.   Contact information:   [address] [place] [address] [phone]        Signed: Vear Clock,  R 05/15/2015, 7:49 AM

## 2015-05-15 NOTE — Progress Notes (Signed)
PATIENT ID: Kathy Coleman  MRN: [id number]  DOB/AGE:  1956/05/02 / 59 y.o.  2 Days Post-Op Procedure(s) (LRB): TOTAL HIP ARTHROPLASTY ANTERIOR APPROACH (Right)    PROGRESS NOTE Subjective: Patient is alert, oriented, no Nausea, no Vomiting, yes passing gas, . Taking PO well. Denies SOB, Chest or Calf Pain. Using Incentive Spirometer, PAS in place. Ambulate WBAT with Pt walking 200 ft Patient reports pain as  2/10  .    Objective: Vital signs in last 24 hours: Filed Vitals:   05/14/15 1828 05/14/15 1831 05/14/15 2027 05/15/15 0446  BP: 87/51 99/55 95/48  102/47  Pulse: 74  75 69  Temp: 98.6 F (37 C)  98.1 F (36.7 C) 98 F (36.7 C)  TempSrc: Oral  Oral   Resp: 19  18 18   SpO2: 98%  96% 96%      Intake/Output from previous day: I/O last 3 completed shifts: In: 1200 [P.O.:1200] Out: 4 [Urine:3; Stool:1]   Intake/Output this shift:     LABORATORY DATA:  Recent Labs  05/14/15 0318 05/15/15 0630  WBC 4.8 8.2  HGB 10.1* 8.9*  HCT 31.0* 26.9*  PLT 173 156    Examination: Neurologically intact Neurovascular intact Sensation intact distally Intact pulses distally Dorsiflexion/Plantar flexion intact Incision: dressing C/D/I No cellulitis present Compartment soft} XR AP&Lat of hip shows well placed\fixed THA  Assessment:   2 Days Post-Op Procedure(s) (LRB): TOTAL HIP ARTHROPLASTY ANTERIOR APPROACH (Right) ADDITIONAL DIAGNOSIS:  Expected Acute Blood Loss Anemia, Hypertension  Plan: PT/OT WBAT,   DVT Prophylaxis: SCDx72 hrs, ASA 325 mg BID x 2 weeks  DISCHARGE PLAN: Home  DISCHARGE NEEDS: HHPT, Walker and 3-in-1 comode seat

## 2015-05-15 NOTE — Care Management Note (Signed)
Case Management Note  Patient Details  Name: Kathy Coleman MRN: [id number] Date of Birth: 1955/08/16  Subjective/Objective:  S/p right total hip arthroplasty                   Action/Plan: Please see previous NCM notes. [facility] summary [facility], # [phone], phone # [phone]. Spoke to [facility] rep, [employer]. They are scheduled to see her 05/16/2015. Pt reports having RW and 3n1 bedside commode at home. Husband at home to assist with care.   Expected Discharge Date:  05/15/2015               Expected Discharge Plan:  Home w Home Health Services  In-House Referral:  NA  Discharge planning Services  CM Consult  Post Acute Care Choice:  Home Health Choice offered to:  Patient   HH Arranged:  PT HH Agency:  [facility]  Status of Service:  Completed, signed off  Medicare Important Message Given:    Date Medicare IM Given:    Medicare IM give by:    Date Additional Medicare IM Given:    Additional Medicare Important Message give by:     If discussed at Long Length of Stay Meetings, dates discussed:    Additional Comments:  Elliot CousinShavis,  Ellen, RN 05/15/2015, 10:37 AM

## 2015-05-15 NOTE — Progress Notes (Signed)
Pt discharge education and instructions completed with pt and spouse at bedside; both voices understanding and denies any questions. Pt IV removed; incision dsg clean, dry and intact; pt handed her prescriptions for ENDOCET, aspirin and robaxin. Pt discharge home with spouse to transport her home. Pt transported off unit via wheelchair with belongings and spouse at side. Dionne BucyP. Amo  RN

## 2015-05-29 ENCOUNTER — Other Ambulatory Visit: Payer: Self-pay | Admitting: Orthopedic Surgery

## 2015-06-06 ENCOUNTER — Other Ambulatory Visit: Payer: Self-pay | Admitting: Orthopedic Surgery

## 2015-06-12 NOTE — Pre-Procedure Instructions (Signed)
Kathy Coleman  06/12/2015      [facility] - [place], [place] - [id number] [place] [id number] Phone: [phone] Fax: [phone]  [facility] [place] - [id number] [place] [id number] Phone: [phone] Fax: [phone]    Your procedure is scheduled on Mon, Dec 19 @ 12:55 PM  [facility] at 10:45 AM  Call this number if you have problems the morning of surgery:  [phone]   Remember:  Do not eat food or drink liquids after midnight.  Take these medicines the morning of surgery with A SIP OF WATER Gabapentin(Neurontin) and Tramadol(Ultram-if needed)               Stop taking your Indomethacin a week prior to surgery. No Goody's,BC's,Aleve,Aspirin,Ibuprofen,Motrin,Advil,Fish Oil,or any Herbal Medications.    Do not wear jewelry, make-up or nail polish.  Do not wear lotions, powders, or perfumes.  You may wear deodorant.  Do not shave 48 hours prior to surgery.    Do not bring valuables to the hospital.  [facility] is not responsible for any belongings or valuables.  Contacts, dentures or bridgework may not be worn into surgery.  Leave your suitcase in the car.  After surgery it may be brought to your room.  For patients admitted to the hospital, discharge time will be determined by your treatment team.  Patients discharged the day of surgery will not be allowed to drive home.   Special instructions: [facility] - Preparing for Surgery  Before surgery, you can play an important role.  Because skin is not sterile, your skin needs to be as free of germs as possible.  You can reduce the number of germs on you skin by washing with CHG (chlorahexidine gluconate) soap before surgery.  CHG is an antiseptic cleaner which kills germs and bonds with the skin to continue killing germs even after washing.  Please DO NOT use if you have an allergy  to CHG or antibacterial soaps.  If your skin becomes reddened/irritated stop using the CHG and inform your nurse when you arrive at Short Stay.  Do not shave (including legs and underarms) for at least 48 hours prior to the first CHG shower.  You may shave your face.  Please follow these instructions carefully:   1.  Shower with CHG Soap the night before surgery and the                                morning of Surgery.  2.  If you choose to wash your hair, wash your hair first as usual with your       normal shampoo.  3.  After you shampoo, rinse your hair and body thoroughly to remove the                      Shampoo.  4.  Use CHG as you would any other liquid soap.  You can apply chg directly       to the skin and wash gently with scrungie or a clean washcloth.  5.  Apply the CHG Soap to your body ONLY FROM THE NECK DOWN.        Do not use on open wounds or open sores.  Avoid contact with your eyes,  ears, mouth and genitals (private parts).  Wash genitals (private parts)       with your normal soap.  6.  Wash thoroughly, paying special attention to the area where your surgery        will be performed.  7.  Thoroughly rinse your body with warm water from the neck down.  8.  DO NOT shower/wash with your normal soap after using and rinsing off       the CHG Soap.  9.  Pat yourself dry with a clean towel.            10.  Wear clean pajamas.            11.  Place clean sheets on your bed the night of your first shower and do not        sleep with pets.  Day of Surgery  Do not apply any lotions/deoderants the morning of surgery.  Please wear clean clothes to the hospital/surgery center.    Please read over the following fact sheets that you were given. Pain Booklet, Coughing and Deep Breathing, Blood Transfusion Information, MRSA Information and Surgical Site Infection Prevention

## 2015-06-13 ENCOUNTER — Encounter (HOSPITAL_COMMUNITY)
Admission: RE | Admit: 2015-06-13 | Discharge: 2015-06-13 | Disposition: A | Discharge status: Home / Self Care | Payer: Medicare FFS | Source: Ambulatory Visit | Attending: Orthopedic Surgery | Admitting: Orthopedic Surgery

## 2015-06-13 ENCOUNTER — Encounter (HOSPITAL_COMMUNITY): Payer: Self-pay

## 2015-06-13 DIAGNOSIS — M179 Osteoarthritis of knee, unspecified: Secondary | ICD-10-CM | POA: Diagnosis not present

## 2015-06-13 DIAGNOSIS — Z01812 Encounter for preprocedural laboratory examination: Secondary | ICD-10-CM | POA: Diagnosis not present

## 2015-06-13 DIAGNOSIS — Z0183 Encounter for blood typing: Secondary | ICD-10-CM | POA: Diagnosis not present

## 2015-06-13 LAB — CBC WITH DIFFERENTIAL/PLATELET
BASOS ABS: 0 10*3/uL (ref 0.0–0.1)
BASOS PCT: 0 %
EOS ABS: 0.1 10*3/uL (ref 0.0–0.7)
Eosinophils Relative: 2 %
HEMATOCRIT: 33.2 % — AB (ref 36.0–46.0)
Hemoglobin: 11 g/dL — ABNORMAL LOW (ref 12.0–15.0)
Lymphocytes Relative: 23 %
Lymphs Abs: 1.1 10*3/uL (ref 0.7–4.0)
MCH: 30.8 pg (ref 26.0–34.0)
MCHC: 33.1 g/dL (ref 30.0–36.0)
MCV: 93 fL (ref 78.0–100.0)
MONO ABS: 0.4 10*3/uL (ref 0.1–1.0)
Monocytes Relative: 8 %
NEUTROS ABS: 3.1 10*3/uL (ref 1.7–7.7)
NEUTROS PCT: 67 %
Platelets: 180 10*3/uL (ref 150–400)
RBC: 3.57 MIL/uL — ABNORMAL LOW (ref 3.87–5.11)
RDW: 12.9 % (ref 11.5–15.5)
WBC: 4.6 10*3/uL (ref 4.0–10.5)

## 2015-06-13 LAB — BASIC METABOLIC PANEL
ANION GAP: 6 (ref 5–15)
BUN: 14 mg/dL (ref 6–20)
CALCIUM: 9 mg/dL (ref 8.9–10.3)
CO2: 28 mmol/L (ref 22–32)
CREATININE: 1.01 mg/dL — AB (ref 0.44–1.00)
Chloride: 105 mmol/L (ref 101–111)
GFR, EST NON AFRICAN AMERICAN: 59 mL/min — AB (ref >60)
Glucose, Bld: 106 mg/dL — ABNORMAL HIGH (ref 65–99)
Potassium: 4.7 mmol/L (ref 3.5–5.1)
Sodium: 139 mmol/L (ref 135–145)

## 2015-06-13 LAB — URINALYSIS, ROUTINE W REFLEX MICROSCOPIC
BILIRUBIN URINE: NEGATIVE
GLUCOSE, UA: NEGATIVE mg/dL
Hgb urine dipstick: NEGATIVE
KETONES UR: NEGATIVE mg/dL
Nitrite: NEGATIVE
PH: 6 (ref 5.0–8.0)
Protein, ur: NEGATIVE mg/dL
SPECIFIC GRAVITY, URINE: 1.009 (ref 1.005–1.030)

## 2015-06-13 LAB — TYPE AND SCREEN
ABO/RH(D): O POS
Antibody Screen: NEGATIVE

## 2015-06-13 LAB — URINE MICROSCOPIC-ADD ON

## 2015-06-13 LAB — PROTIME-INR
INR: 1.11 (ref 0.00–1.49)
PROTHROMBIN TIME: 14.4 s (ref 11.6–15.2)

## 2015-06-13 LAB — SURGICAL PCR SCREEN
MRSA, PCR: NEGATIVE
Staphylococcus aureus: NEGATIVE

## 2015-06-13 LAB — APTT: APTT: 32 s (ref 24–37)

## 2015-06-21 NOTE — H&P (Signed)
TOTAL KNEE ADMISSION H&P  Patient is being admitted for left total knee arthroplasty.  Subjective:  Chief Complaint:left knee pain.  HPI: Kathy Coleman, 59 y.o. female, has a history of pain and functional disability in the left knee due to arthritis and has failed non-surgical conservative treatments for greater than 12 weeks to includeNSAID's and/or analgesics, corticosteriod injections, flexibility and strengthening excercises, supervised PT with diminished ADL's post treatment, use of assistive devices, weight reduction as appropriate and activity modification.  Onset of symptoms was gradual, starting 1 years ago with gradually worsening course since that time. The patient noted no past surgery on the left knee(s).  Patient currently rates pain in the left knee(s) at 10 out of 10 with activity. Patient has night pain, worsening of pain with activity and weight bearing, pain that interferes with activities of daily living and pain with passive range of motion.  Patient has evidence of periarticular osteophytes, joint subluxation and joint space narrowing by imaging studies.  There is no active infection.  Patient Active Problem List   Diagnosis Date Noted  . Primary osteoarthritis of right hip 05/11/2015  . Postoperative anemia due to acute blood loss 07/24/2014  . Lumbar radiculopathy 07/23/2014   Past Medical History  Diagnosis Date  . PONV (postoperative nausea and vomiting)   . Hypertension   . Arthritis     Past Surgical History  Procedure Laterality Date  . Foot surgery      RIGHT  2014  . Cholecystectomy    . Abdominal hysterectomy    . Back surgery      1998  , 2010  LOW BACK    . Neck surgery      2005, 2010  . Total hip arthroplasty Right 05/13/2015    Procedure: TOTAL HIP ARTHROPLASTY ANTERIOR APPROACH;  Surgeon: Gean BirchwoodFrank Rowan, MD;  Location: [facility] OR;  Service: Orthopedics;  Laterality: Right;    No prescriptions prior to admission   No Known Allergies  Social History   Substance Use Topics  . Smoking status: Former Games developermoker  . Smokeless tobacco: Not on file  . Alcohol Use: No    No family history on file.   Review of Systems  Constitutional: Negative.   HENT: Negative.   Eyes: Negative.   Respiratory: Negative.   Cardiovascular:       HTN  Gastrointestinal: Positive for constipation.  Genitourinary:       Kidney stones  Musculoskeletal: Positive for joint pain.  Skin: Negative.   Neurological: Negative.   Endo/Heme/Allergies: Bruises/bleeds easily.  Psychiatric/Behavioral: Negative.     Objective:  Physical Exam  Constitutional: She is oriented to person, place, and time. She appears well-developed and well-nourished.  HENT:  Head: Normocephalic and atraumatic.  Eyes: Pupils are equal, round, and reactive to light.  Neck: Normal range of motion. Neck supple.  Cardiovascular: Intact distal pulses.   Respiratory: Effort normal.  Musculoskeletal: She exhibits tenderness.  Both knees have impressive valgus deformities of 10.  One plus effusion on the right, trace effusion on the left.  Range of motion bilaterally is 0/135 with significant discomfort in full extension or full flexion.  Valgus stress reveals intact collateral ligaments, but does exacerbate the pain along the lateral joint lines of both knees.  The left knee is much less irritable having had the recent cortisone injection.  Neurological: She is alert and oriented to person, place, and time.  Skin: Skin is warm and dry.  Psychiatric: She has a normal mood and affect.  Her behavior is normal. Judgment and thought content normal.    Vital signs in last 24 hours:    Labs:   Estimated body mass index is 31.04 kg/(m^2) as calculated from the following:   Height as of 05/03/15:  (1.651 m).   Weight as of 05/03/15: 84.596 kg (186 lb 8 oz).   Imaging Review Plain radiographs demonstrate AP, Rosenberg, lateral and sunrise x-rays of both knees shows bone-on-bone end-stage  arthritis lateral compartments with subluxation of tibias beneath the femurs, 5 mm and peripheral osteophytes.  Assessment/Plan:  End stage arthritis, left knee   The patient history, physical examination, clinical judgment of the provider and imaging studies are consistent with end stage degenerative joint disease of the left knee(s) and total knee arthroplasty is deemed medically necessary. The treatment options including medical management, injection therapy arthroscopy and arthroplasty were discussed at length. The risks and benefits of total knee arthroplasty were presented and reviewed. The risks due to aseptic loosening, infection, stiffness, patella tracking problems, thromboembolic complications and other imponderables were discussed. The patient acknowledged the explanation, agreed to proceed with the plan and consent was signed. Patient is being admitted for inpatient treatment for surgery, pain control, PT, OT, prophylactic antibiotics, VTE prophylaxis, progressive ambulation and ADL's and discharge planning. The patient is planning to be discharged home with home health services

## 2015-06-22 DIAGNOSIS — M1711 Unilateral primary osteoarthritis, right knee: Secondary | ICD-10-CM | POA: Diagnosis present

## 2015-06-23 MED ORDER — TRANEXAMIC ACID 1000 MG/10ML IV SOLN
1000.0000 mg | INTRAVENOUS | Status: AC
  Administered 2015-06-24: 1000 mg via INTRAVENOUS
  Filled 2015-06-23: qty 10

## 2015-06-23 MED ORDER — CEFAZOLIN SODIUM-DEXTROSE 2-3 GM-% IV SOLR
2.0000 g | INTRAVENOUS | Status: AC
  Administered 2015-06-24: 2 g via INTRAVENOUS
  Filled 2015-06-23: qty 50

## 2015-06-23 MED ORDER — BUPIVACAINE LIPOSOME 1.3 % IJ SUSP
20.0000 mL | Freq: Once | INTRAMUSCULAR | Status: DC
  Filled 2015-06-23: qty 20

## 2015-06-23 MED ORDER — DEXTROSE-NACL 5-0.45 % IV SOLN: INTRAVENOUS | Status: DC

## 2015-06-23 MED ORDER — CHLORHEXIDINE GLUCONATE 4 % EX LIQD: 60.0000 mL | Freq: Once | CUTANEOUS | Status: DC

## 2015-06-24 ENCOUNTER — Encounter (HOSPITAL_COMMUNITY): Payer: Self-pay | Admitting: *Deleted

## 2015-06-24 ENCOUNTER — Inpatient Hospital Stay (HOSPITAL_COMMUNITY): Payer: Medicare FFS | Admitting: Certified Registered Nurse Anesthetist

## 2015-06-24 ENCOUNTER — Inpatient Hospital Stay (HOSPITAL_COMMUNITY)
Admission: RE | Admit: 2015-06-24 | Discharge: 2015-06-26 | DRG: 470 | Disposition: A | Discharge status: Home Health | Payer: Medicare FFS | Source: Ambulatory Visit | Attending: Orthopedic Surgery | Admitting: Orthopedic Surgery

## 2015-06-24 ENCOUNTER — Encounter (HOSPITAL_COMMUNITY)
Admission: RE | Disposition: A | Discharge status: Home Health | Payer: Self-pay | Source: Ambulatory Visit | Attending: Orthopedic Surgery

## 2015-06-24 DIAGNOSIS — M1712 Unilateral primary osteoarthritis, left knee: Secondary | ICD-10-CM | POA: Diagnosis present

## 2015-06-24 DIAGNOSIS — Z87891 Personal history of nicotine dependence: Secondary | ICD-10-CM | POA: Diagnosis not present

## 2015-06-24 DIAGNOSIS — D62 Acute posthemorrhagic anemia: Secondary | ICD-10-CM | POA: Diagnosis not present

## 2015-06-24 DIAGNOSIS — I10 Essential (primary) hypertension: Secondary | ICD-10-CM | POA: Diagnosis present

## 2015-06-24 DIAGNOSIS — K5900 Constipation, unspecified: Secondary | ICD-10-CM | POA: Diagnosis present

## 2015-06-24 DIAGNOSIS — Z96641 Presence of right artificial hip joint: Secondary | ICD-10-CM | POA: Diagnosis present

## 2015-06-24 DIAGNOSIS — M25562 Pain in left knee: Secondary | ICD-10-CM | POA: Diagnosis present

## 2015-06-24 DIAGNOSIS — M1710 Unilateral primary osteoarthritis, unspecified knee: Secondary | ICD-10-CM | POA: Diagnosis present

## 2015-06-24 DIAGNOSIS — M1711 Unilateral primary osteoarthritis, right knee: Secondary | ICD-10-CM | POA: Diagnosis present

## 2015-06-24 HISTORY — PX: TOTAL KNEE ARTHROPLASTY: SHX125

## 2015-06-24 SURGERY — ARTHROPLASTY, KNEE, TOTAL
Anesthesia: General | Site: Knee | Laterality: Right

## 2015-06-24 MED ORDER — SCOPOLAMINE 1 MG/3DAYS TD PT72
MEDICATED_PATCH | TRANSDERMAL | Status: AC
  Administered 2015-06-24: 1.5 mg via TRANSDERMAL
  Filled 2015-06-24: qty 1

## 2015-06-24 MED ORDER — MENTHOL 3 MG MT LOZG: 1.0000 | LOZENGE | OROMUCOSAL | Status: DC | PRN

## 2015-06-24 MED ORDER — ENDOCET 10-325 MG PO TABS: 1.0000 | ORAL_TABLET | ORAL | Status: DC | PRN

## 2015-06-24 MED ORDER — HYDROMORPHONE HCL 1 MG/ML IJ SOLN
INTRAMUSCULAR | Status: AC
  Filled 2015-06-24: qty 1

## 2015-06-24 MED ORDER — BISACODYL 5 MG PO TBEC: 5.0000 mg | DELAYED_RELEASE_TABLET | Freq: Every day | ORAL | Status: DC | PRN

## 2015-06-24 MED ORDER — OXYCODONE HCL 5 MG PO TABS
ORAL_TABLET | ORAL | Status: AC
  Filled 2015-06-24: qty 2

## 2015-06-24 MED ORDER — BUPIVACAINE LIPOSOME 1.3 % IJ SUSP
20.0000 mL | INTRAMUSCULAR | Status: AC
  Administered 2015-06-24: 20 mL
  Filled 2015-06-24: qty 20

## 2015-06-24 MED ORDER — FENTANYL CITRATE (PF) 250 MCG/5ML IJ SOLN
INTRAMUSCULAR | Status: AC
  Filled 2015-06-24: qty 5

## 2015-06-24 MED ORDER — BUPIVACAINE HCL (PF) 0.25 % IJ SOLN
INTRAMUSCULAR | Status: DC | PRN
  Administered 2015-06-24: 20 mL

## 2015-06-24 MED ORDER — HYDROMORPHONE HCL 1 MG/ML IJ SOLN
0.2500 mg | INTRAMUSCULAR | Status: DC | PRN
  Administered 2015-06-24 (×2): 1 mg via INTRAVENOUS

## 2015-06-24 MED ORDER — MIDAZOLAM HCL 5 MG/5ML IJ SOLN
INTRAMUSCULAR | Status: DC | PRN
  Administered 2015-06-24: 2 mg via INTRAVENOUS

## 2015-06-24 MED ORDER — METOCLOPRAMIDE HCL 5 MG/ML IJ SOLN: 5.0000 mg | Freq: Three times a day (TID) | INTRAMUSCULAR | Status: DC | PRN

## 2015-06-24 MED ORDER — ASPIRIN EC 325 MG PO TBEC
325.0000 mg | DELAYED_RELEASE_TABLET | Freq: Every day | ORAL | Status: DC
  Administered 2015-06-25 – 2015-06-26 (×2): 325 mg via ORAL
  Filled 2015-06-24 (×2): qty 1

## 2015-06-24 MED ORDER — HYDROMORPHONE HCL 1 MG/ML IJ SOLN
0.2500 mg | INTRAMUSCULAR | Status: DC | PRN
  Administered 2015-06-24: 0.5 mg via INTRAVENOUS

## 2015-06-24 MED ORDER — SCOPOLAMINE 1 MG/3DAYS TD PT72
1.0000 | MEDICATED_PATCH | Freq: Once | TRANSDERMAL | Status: DC
  Administered 2015-06-24: 1.5 mg via TRANSDERMAL

## 2015-06-24 MED ORDER — ACETAMINOPHEN 325 MG PO TABS: 650.0000 mg | ORAL_TABLET | Freq: Four times a day (QID) | ORAL | Status: DC | PRN

## 2015-06-24 MED ORDER — PROPOFOL 10 MG/ML IV BOLUS
INTRAVENOUS | Status: DC | PRN
  Administered 2015-06-24: 160 mg via INTRAVENOUS

## 2015-06-24 MED ORDER — FENTANYL CITRATE (PF) 100 MCG/2ML IJ SOLN
INTRAMUSCULAR | Status: DC | PRN
  Administered 2015-06-24 (×2): 50 ug via INTRAVENOUS
  Administered 2015-06-24: 25 ug via INTRAVENOUS
  Administered 2015-06-24 (×2): 50 ug via INTRAVENOUS
  Administered 2015-06-24: 25 ug via INTRAVENOUS
  Administered 2015-06-24 (×5): 50 ug via INTRAVENOUS

## 2015-06-24 MED ORDER — DIPHENHYDRAMINE HCL 50 MG/ML IJ SOLN
INTRAMUSCULAR | Status: AC
  Filled 2015-06-24: qty 1

## 2015-06-24 MED ORDER — TRANEXAMIC ACID 1000 MG/10ML IV SOLN
2000.0000 mg | INTRAVENOUS | Status: AC
  Administered 2015-06-24: 2000 mg via TOPICAL
  Filled 2015-06-24: qty 20

## 2015-06-24 MED ORDER — PROPOFOL 10 MG/ML IV BOLUS
INTRAVENOUS | Status: AC
  Filled 2015-06-24: qty 20

## 2015-06-24 MED ORDER — BUPIVACAINE HCL (PF) 0.25 % IJ SOLN
INTRAMUSCULAR | Status: AC
  Filled 2015-06-24: qty 30

## 2015-06-24 MED ORDER — DOCUSATE SODIUM 100 MG PO CAPS
100.0000 mg | ORAL_CAPSULE | Freq: Two times a day (BID) | ORAL | Status: DC
  Administered 2015-06-24 – 2015-06-26 (×4): 100 mg via ORAL
  Filled 2015-06-24 (×4): qty 1

## 2015-06-24 MED ORDER — METHOCARBAMOL 500 MG PO TABS
500.0000 mg | ORAL_TABLET | Freq: Four times a day (QID) | ORAL | Status: DC | PRN
  Administered 2015-06-25 – 2015-06-26 (×5): 500 mg via ORAL
  Filled 2015-06-24 (×6): qty 1

## 2015-06-24 MED ORDER — SODIUM CHLORIDE 0.9 % IJ SOLN
INTRAMUSCULAR | Status: DC | PRN
  Administered 2015-06-24: 20 mL via INTRAVENOUS

## 2015-06-24 MED ORDER — GABAPENTIN 300 MG PO CAPS
300.0000 mg | ORAL_CAPSULE | Freq: Three times a day (TID) | ORAL | Status: DC
  Administered 2015-06-24 – 2015-06-26 (×6): 300 mg via ORAL
  Filled 2015-06-24 (×6): qty 1

## 2015-06-24 MED ORDER — ARTIFICIAL TEARS OP OINT
TOPICAL_OINTMENT | OPHTHALMIC | Status: AC
  Filled 2015-06-24: qty 3.5

## 2015-06-24 MED ORDER — INDOMETHACIN 50 MG PO CAPS
50.0000 mg | ORAL_CAPSULE | Freq: Three times a day (TID) | ORAL | Status: DC
  Filled 2015-06-24 (×2): qty 1

## 2015-06-24 MED ORDER — HYDROMORPHONE HCL 1 MG/ML IJ SOLN
0.5000 mg | INTRAMUSCULAR | Status: DC | PRN
  Administered 2015-06-24 – 2015-06-25 (×4): 1 mg via INTRAVENOUS
  Filled 2015-06-24 (×4): qty 1

## 2015-06-24 MED ORDER — ASPIRIN EC 325 MG PO TBEC: 325.0000 mg | DELAYED_RELEASE_TABLET | Freq: Two times a day (BID) | ORAL | Status: DC

## 2015-06-24 MED ORDER — ONDANSETRON HCL 4 MG/2ML IJ SOLN
INTRAMUSCULAR | Status: AC
  Filled 2015-06-24: qty 2

## 2015-06-24 MED ORDER — ONDANSETRON HCL 4 MG/2ML IJ SOLN: 4.0000 mg | Freq: Four times a day (QID) | INTRAMUSCULAR | Status: DC | PRN

## 2015-06-24 MED ORDER — LACTATED RINGERS IV SOLN
Freq: Once | INTRAVENOUS | Status: AC
  Administered 2015-06-24: 11:00:00 via INTRAVENOUS

## 2015-06-24 MED ORDER — CEFUROXIME SODIUM 1.5 G IJ SOLR
INTRAMUSCULAR | Status: AC
  Filled 2015-06-24: qty 1.5

## 2015-06-24 MED ORDER — DIPHENHYDRAMINE HCL 50 MG/ML IJ SOLN
INTRAMUSCULAR | Status: DC | PRN
  Administered 2015-06-24: 6.25 mg via INTRAVENOUS

## 2015-06-24 MED ORDER — LISINOPRIL 20 MG PO TABS
20.0000 mg | ORAL_TABLET | Freq: Every day | ORAL | Status: DC
  Administered 2015-06-24 – 2015-06-26 (×3): 20 mg via ORAL
  Filled 2015-06-24 (×3): qty 1

## 2015-06-24 MED ORDER — ALUM & MAG HYDROXIDE-SIMETH 200-200-20 MG/5ML PO SUSP: 30.0000 mL | ORAL | Status: DC | PRN

## 2015-06-24 MED ORDER — OXYCODONE HCL 5 MG PO TABS
5.0000 mg | ORAL_TABLET | ORAL | Status: DC | PRN
  Administered 2015-06-24 – 2015-06-26 (×12): 10 mg via ORAL
  Filled 2015-06-24 (×11): qty 2

## 2015-06-24 MED ORDER — PHENOL 1.4 % MT LIQD: 1.0000 | OROMUCOSAL | Status: DC | PRN

## 2015-06-24 MED ORDER — LACTATED RINGERS IV SOLN
INTRAVENOUS | Status: DC | PRN
  Administered 2015-06-24 (×2): via INTRAVENOUS

## 2015-06-24 MED ORDER — MIDAZOLAM HCL 2 MG/2ML IJ SOLN
INTRAMUSCULAR | Status: AC
  Filled 2015-06-24: qty 2

## 2015-06-24 MED ORDER — CEFUROXIME SODIUM 1.5 G IJ SOLR
INTRAMUSCULAR | Status: DC | PRN
  Administered 2015-06-24: 1.5 g

## 2015-06-24 MED ORDER — ACETAMINOPHEN 650 MG RE SUPP: 650.0000 mg | Freq: Four times a day (QID) | RECTAL | Status: DC | PRN

## 2015-06-24 MED ORDER — SENNOSIDES-DOCUSATE SODIUM 8.6-50 MG PO TABS: 1.0000 | ORAL_TABLET | Freq: Every evening | ORAL | Status: DC | PRN

## 2015-06-24 MED ORDER — ONDANSETRON HCL 4 MG PO TABS: 4.0000 mg | ORAL_TABLET | Freq: Four times a day (QID) | ORAL | Status: DC | PRN

## 2015-06-24 MED ORDER — ATORVASTATIN CALCIUM 20 MG PO TABS
20.0000 mg | ORAL_TABLET | Freq: Every day | ORAL | Status: DC
  Administered 2015-06-24 – 2015-06-25 (×2): 20 mg via ORAL
  Filled 2015-06-24 (×2): qty 1

## 2015-06-24 MED ORDER — INDOMETHACIN 50 MG PO CAPS
50.0000 mg | ORAL_CAPSULE | Freq: Three times a day (TID) | ORAL | Status: DC
  Administered 2015-06-25 – 2015-06-26 (×5): 50 mg via ORAL
  Filled 2015-06-24 (×7): qty 1

## 2015-06-24 MED ORDER — DEXAMETHASONE SODIUM PHOSPHATE 4 MG/ML IJ SOLN
INTRAMUSCULAR | Status: DC | PRN
  Administered 2015-06-24: 8 mg via INTRAVENOUS

## 2015-06-24 MED ORDER — METOCLOPRAMIDE HCL 5 MG PO TABS: 5.0000 mg | ORAL_TABLET | Freq: Three times a day (TID) | ORAL | Status: DC | PRN

## 2015-06-24 MED ORDER — SODIUM CHLORIDE 0.9 % IR SOLN
Status: DC | PRN
  Administered 2015-06-24 (×2): 1000 mL

## 2015-06-24 MED ORDER — METHOCARBAMOL 500 MG PO TABS: 500.0000 mg | ORAL_TABLET | Freq: Two times a day (BID) | ORAL | Status: DC

## 2015-06-24 MED ORDER — FLEET ENEMA 7-19 GM/118ML RE ENEM: 1.0000 | ENEMA | Freq: Once | RECTAL | Status: DC | PRN

## 2015-06-24 MED ORDER — DIPHENHYDRAMINE HCL 12.5 MG/5ML PO ELIX: 12.5000 mg | ORAL_SOLUTION | ORAL | Status: DC | PRN

## 2015-06-24 MED ORDER — LIDOCAINE HCL (CARDIAC) 20 MG/ML IV SOLN
INTRAVENOUS | Status: DC | PRN
  Administered 2015-06-24: 60 mg via INTRAVENOUS

## 2015-06-24 MED ORDER — METHOCARBAMOL 1000 MG/10ML IJ SOLN
500.0000 mg | Freq: Four times a day (QID) | INTRAVENOUS | Status: DC | PRN
  Administered 2015-06-24: 500 mg via INTRAVENOUS
  Filled 2015-06-24 (×3): qty 5

## 2015-06-24 MED ORDER — DEXAMETHASONE SODIUM PHOSPHATE 4 MG/ML IJ SOLN
INTRAMUSCULAR | Status: AC
  Filled 2015-06-24: qty 2

## 2015-06-24 MED ORDER — ONDANSETRON HCL 4 MG/2ML IJ SOLN
INTRAMUSCULAR | Status: DC | PRN
  Administered 2015-06-24: 4 mg via INTRAVENOUS

## 2015-06-24 MED ORDER — PHENYLEPHRINE 40 MCG/ML (10ML) SYRINGE FOR IV PUSH (FOR BLOOD PRESSURE SUPPORT)
PREFILLED_SYRINGE | INTRAVENOUS | Status: AC
  Filled 2015-06-24: qty 20

## 2015-06-24 MED ORDER — KCL IN DEXTROSE-NACL 20-5-0.45 MEQ/L-%-% IV SOLN
INTRAVENOUS | Status: DC
  Administered 2015-06-24 – 2015-06-25 (×2): via INTRAVENOUS
  Filled 2015-06-24 (×3): qty 1000

## 2015-06-24 SURGICAL SUPPLY — 62 items
BANDAGE ESMARK 6X9 LF (GAUZE/BANDAGES/DRESSINGS) ×1 IMPLANT
BLADE SAG 18X100X1.27 (BLADE) ×3 IMPLANT
BLADE SAW SGTL 13X75X1.27 (BLADE) ×3 IMPLANT
BLADE SURG ROTATE 9660 (MISCELLANEOUS) IMPLANT
BNDG ELASTIC 6X10 VLCR STRL LF (GAUZE/BANDAGES/DRESSINGS) ×3 IMPLANT
BNDG ESMARK 6X9 LF (GAUZE/BANDAGES/DRESSINGS) ×3
BOWL SMART MIX CTS (DISPOSABLE) ×3 IMPLANT
CAPT KNEE TOTAL 3 ATTUNE ×3 IMPLANT
CEMENT HV SMART SET (Cement) ×6 IMPLANT
COVER SURGICAL LIGHT HANDLE (MISCELLANEOUS) ×3 IMPLANT
CUFF TOURNIQUET SINGLE 34IN LL (TOURNIQUET CUFF) ×3 IMPLANT
CUFF TOURNIQUET SINGLE 44IN (TOURNIQUET CUFF) IMPLANT
DRAPE EXTREMITY T 121X128X90 (DRAPE) ×3 IMPLANT
DRAPE U-SHAPE 47X51 STRL (DRAPES) ×3 IMPLANT
DURAPREP 26ML APPLICATOR (WOUND CARE) ×6 IMPLANT
ELECT CAUTERY BLADE 6.4 (BLADE) ×3 IMPLANT
ELECT REM PT RETURN 9FT ADLT (ELECTROSURGICAL) ×3
ELECTRODE REM PT RTRN 9FT ADLT (ELECTROSURGICAL) ×1 IMPLANT
EVACUATOR 1/8 PVC DRAIN (DRAIN) IMPLANT
GAUZE SPONGE 4X4 12PLY STRL (GAUZE/BANDAGES/DRESSINGS) ×6 IMPLANT
GAUZE XEROFORM 1X8 LF (GAUZE/BANDAGES/DRESSINGS) ×3 IMPLANT
GAUZE XEROFORM 5X9 LF (GAUZE/BANDAGES/DRESSINGS) ×3 IMPLANT
GLOVE BIO SURGEON STRL SZ7.5 (GLOVE) ×3 IMPLANT
GLOVE BIO SURGEON STRL SZ8.5 (GLOVE) ×3 IMPLANT
GLOVE BIOGEL PI IND STRL 7.0 (GLOVE) ×1 IMPLANT
GLOVE BIOGEL PI IND STRL 8 (GLOVE) ×1 IMPLANT
GLOVE BIOGEL PI IND STRL 9 (GLOVE) ×1 IMPLANT
GLOVE BIOGEL PI INDICATOR 7.0 (GLOVE) ×2
GLOVE BIOGEL PI INDICATOR 8 (GLOVE) ×2
GLOVE BIOGEL PI INDICATOR 9 (GLOVE) ×2
GLOVE SURG SS PI 7.0 STRL IVOR (GLOVE) ×3 IMPLANT
GOWN STRL REUS W/ TWL LRG LVL3 (GOWN DISPOSABLE) ×1 IMPLANT
GOWN STRL REUS W/ TWL XL LVL3 (GOWN DISPOSABLE) ×2 IMPLANT
GOWN STRL REUS W/TWL LRG LVL3 (GOWN DISPOSABLE) ×2
GOWN STRL REUS W/TWL XL LVL3 (GOWN DISPOSABLE) ×4
HANDPIECE INTERPULSE COAX TIP (DISPOSABLE) ×2
HOOD PEEL AWAY FACE SHEILD DIS (HOOD) ×6 IMPLANT
KIT BASIN OR (CUSTOM PROCEDURE TRAY) ×3 IMPLANT
KIT ROOM TURNOVER OR (KITS) ×3 IMPLANT
MANIFOLD NEPTUNE II (INSTRUMENTS) ×3 IMPLANT
NEEDLE SPNL 18GX3.5 QUINCKE PK (NEEDLE) IMPLANT
NS IRRIG 1000ML POUR BTL (IV SOLUTION) ×3 IMPLANT
PACK TOTAL JOINT (CUSTOM PROCEDURE TRAY) ×3 IMPLANT
PACK UNIVERSAL I (CUSTOM PROCEDURE TRAY) ×3 IMPLANT
PAD ARMBOARD 7.5X6 YLW CONV (MISCELLANEOUS) ×6 IMPLANT
PADDING CAST COTTON 6X4 STRL (CAST SUPPLIES) ×3 IMPLANT
SET HNDPC FAN SPRY TIP SCT (DISPOSABLE) ×1 IMPLANT
SPONGE GAUZE 4X4 12PLY STER LF (GAUZE/BANDAGES/DRESSINGS) ×3 IMPLANT
SUT VIC AB 0 CT1 27 (SUTURE) ×2
SUT VIC AB 0 CT1 27XBRD ANBCTR (SUTURE) ×1 IMPLANT
SUT VIC AB 1 CTX 36 (SUTURE) ×2
SUT VIC AB 1 CTX36XBRD ANBCTR (SUTURE) ×1 IMPLANT
SUT VIC AB 2-0 CT1 27 (SUTURE) ×2
SUT VIC AB 2-0 CT1 TAPERPNT 27 (SUTURE) ×1 IMPLANT
SUT VIC AB 3-0 CT1 27 (SUTURE) ×2
SUT VIC AB 3-0 CT1 TAPERPNT 27 (SUTURE) ×1 IMPLANT
SUT VIC AB 3-0 FS2 27 (SUTURE) ×3 IMPLANT
SYR 50ML LL SCALE MARK (SYRINGE) ×3 IMPLANT
TOWEL OR 17X24 6PK STRL BLUE (TOWEL DISPOSABLE) ×3 IMPLANT
TOWEL OR 17X26 10 PK STRL BLUE (TOWEL DISPOSABLE) ×3 IMPLANT
TRAY CATH 16FR W/PLASTIC CATH (SET/KITS/TRAYS/PACK) IMPLANT
WATER STERILE IRR 1000ML POUR (IV SOLUTION) ×9 IMPLANT

## 2015-06-24 NOTE — Discharge Instructions (Signed)

## 2015-06-24 NOTE — Anesthesia Postprocedure Evaluation (Signed)
Anesthesia Post Note  Patient: Kathy Coleman  Procedure(s) Performed: Procedure(s) (LRB): TOTAL KNEE ARTHROPLASTY (Right)  Patient location during evaluation: PACU Anesthesia Type: General Level of consciousness: awake Pain management: pain level controlled Respiratory status: spontaneous breathing Cardiovascular status: blood pressure returned to baseline Anesthetic complications: no    Last Vitals:  Filed Vitals:   06/24/15 1630 06/24/15 1706  BP:  138/80  Pulse: 95 64  Temp: 36.6 C 36.8 C  Resp: 15     Last Pain:  Filed Vitals:   06/24/15 1729  PainSc: 8                  EDWARDS,

## 2015-06-24 NOTE — Progress Notes (Signed)
Orthopedic Tech Progress Note Patient Details:  Kathy Coleman 12-02-1955 [id number]  CPM Right Knee CPM Right Knee: On Right Knee Flexion (Degrees): 40 Right Knee Extension (Degrees): 0 Additional Comments: trapeze bar patient helper Viewed order from doctor's order list  Nikki DomCrawford,  06/24/2015, 3:51 PM

## 2015-06-24 NOTE — Anesthesia Procedure Notes (Signed)
Procedure Name: LMA Insertion Date/Time: 06/24/2015 1:36 PM Performed by: Margaree MackintoshYACOUB,  B Pre-anesthesia Checklist: Patient identified, Emergency Drugs available, Suction available, Patient being monitored and Timeout performed Patient Re-evaluated:Patient Re-evaluated prior to inductionOxygen Delivery Method: Circle system utilized Preoxygenation: Pre-oxygenation with 100% oxygen Intubation Type: IV induction LMA: LMA inserted LMA Size: 4.0 Number of attempts: 1 Placement Confirmation: positive ETCO2 Tube secured with: Tape Dental Injury: Teeth and Oropharynx as per pre-operative assessment

## 2015-06-24 NOTE — Transfer of Care (Signed)
Immediate Anesthesia Transfer of Care Note  Patient: Kathy Coleman  Procedure(s) Performed: Procedure(s): TOTAL KNEE ARTHROPLASTY (Right)  Patient Location: PACU  Anesthesia Type:General  Level of Consciousness: awake, alert  and oriented  Airway & Oxygen Therapy: Patient Spontanous Breathing and Patient connected to nasal cannula oxygen  Post-op Assessment: Report given to RN and Post -op Vital signs reviewed and stable  Post vital signs: Reviewed and stable  Last Vitals:  Filed Vitals:   06/24/15 1102 06/24/15 1523  BP: 138/69   Pulse: 81   Temp: 36.7 C 36.6 C  Resp: 20     Complications: No apparent anesthesia complications

## 2015-06-24 NOTE — Op Note (Addendum)
PATIENT ID:      Kathy MediciMyra L Hoog  MRN:     [id number] DOB/AGE:    September 15, 1955 / 59 y.o.       OPERATIVE REPORT    DATE OF PROCEDURE:  06/24/2015       PREOPERATIVE DIAGNOSIS:   Right KNEE OSTEOARTHRITIS      Estimated body mass index is 31.67 kg/(m^2) as calculated from the following:   Height as of this encounter: 5\' 5"  (1.651 m).   Weight as of this encounter: 86.32 kg (190 lb 4.8 oz).                                                        POSTOPERATIVE DIAGNOSIS:  Right KNEE OSTEOARTHRITIS                                                                      PROCEDURE:  Procedure(s): Right TOTAL KNEE ARTHROPLASTY Using DepuyAttune RP implants #5R Femur, #6Tibia, 5 mm Attune RP bearing, 38 Patella     SURGEON: , J    ASSISTANT:   Eric K. [employer] PA-C   (Present and scrubbed throughout the case, critical for assistance with exposure, retraction, instrumentation, and closure.)         ANESTHESIA: GET, 20cc Exparel, 20cc 0.5% Marcaine  EBL: 300  FLUID REPLACEMENT: 1600 crystalloid  TOURNIQUET TIME: 15min  Drains: None  Tranexamic Acid: 1gm iv, 2gm topical   COMPLICATIONS:  None         INDICATIONS FOR PROCEDURE: The patient has Right KNEE OSTEOARTHRITIS, Val deformities, XR shows bone on bone arthritis, lateral subluxation of tibia. Patient has failed all conservative measures including anti-inflammatory medicines, narcotics, attempts at  exercise and weight loss, cortisone injections and viscosupplementation.  Risks and benefits of surgery have been discussed, questions answered.   DESCRIPTION OF PROCEDURE: The patient identified by armband, received  IV antibiotics[facility] Patient taken to the operating room, appropriate anesthetic  monitors were attached, and GET anesthesia was  induced. Tourniquet  applied high to the operative thigh. Lateral post and foot positioner  applied to the table, the lower extremity was then prepped and draped   in usual sterile fashion from the toes to the tourniquet. Time-out procedure was performed. We began the operation, with the knee flexed 120 degrees, by making the anterior midline incision starting at handbreadth above the patella going over the patella 1 cm medial to and 4 cm distal to the tibial tubercle. Small bleeders in the skin and the  subcutaneous tissue identified and cauterized. Transverse retinaculum was incised and reflected medially and a medial parapatellar arthrotomy was accomplished. the patella was everted and theprepatellar fat pad resected. The superficial medial collateral  ligament was then elevated from anterior to posterior along the proximal  flare of the tibia and anterior half of the menisci resected. The knee was hyperflexed exposing bone on bone arthritis. Peripheral and notch osteophytes as well as the cruciate ligaments were then resected. We continued to  work our way around posteriorly along the  proximal tibia, and externally  rotated the tibia subluxing it out from underneath the femur. A McHale  retractor was placed through the notch and a lateral Hohmann retractor  placed, and we then drilled through the proximal tibia in line with the  axis of the tibia followed by an intramedullary guide rod and 2-degree  posterior slope cutting guide. The tibial cutting guide, 3 degree posterior sloped, was pinned into place allowing resection of 7 mm of bone medially and 2 mm of bone laterally. Satisfied with the tibial resection, we then  entered the distal femur 2 mm anterior to the PCL origin with the  intramedullary guide rod and applied the distal femoral cutting guide  set at 9 mm, with 5 degrees of valgus. This was pinned along the  epicondylar axis. At this point, the distal femoral cut was accomplished without difficulty. We then sized for a #5R femoral component and pinned the guide in 3 degrees of external rotation. The chamfer cutting guide was pinned into place. The  anterior, posterior, and chamfer cuts were accomplished without difficulty followed by  the Attune RP box cutting guide and the box cut. We also removed posterior osteophytes from the posterior femoral condyles. At this  time, the knee was brought into full extension. We checked our  extension and flexion gaps and found them symmetric for a 5 mm bearing. Distracting in extension with a lamina spreader, the posterior horns of the menisci were removed, and Exparel, diluted to 60 cc, with 20cc NS, and 20cc 0.5% Marcaine,was injected into the capsule and synovium of the knee. The posterior patella cut was accomplished with the 9.5 mm Attune cutting guide, sized for a 38mm dome, and the fixation pegs drilled.The knee  was then once again hyperflexed exposing the proximal tibia. We sized for a # 6 tibial base plate, applied the smokestack and the conical reamer followed by the the Delta fin keel punch. We then hammered into place the Attune RP trial femoral component, drilled the lugs, inserted a  5 mm trial bearing, trial patellar button, and took the knee through range of motion from 0-130 degrees. No thumb pressure was required for patellar Tracking. At this point, the limb was wrapped with an Esmarch bandage and the tourniquet inflated to 350 mmHg. All trial components were removed, mating surfaces irrigated with pulse lavage, and dried with suction and sponges. A double batch of DePuy HV cement with 1500 mg of Zinacef was mixed and applied to all bony metallic mating surfaces except for the posterior condyles of the femur itself. In order, we  hammered into place the tibial tray and removed excess cement, the femoral component and removed excess cement. The final Attune RP bearing  was inserted, and the knee brought to full extension with compression.  The patellar button was clamped into place, and excess cement  removed. While the cement cured the wound was irrigated out with normal saline solution pulse  lavage. Ligament stability and patellar tracking were checked and found to be excellent. The parapatellar arthrotomy was closed with  running #1 Vicryl suture. The subcutaneous tissue with 0 and 2-0 undyed  Vicryl suture, and the skin with running 3-0 SQ vicryl. A dressing of Xeroform,  4 x 4, dressing sponges, Webril, and Ace wrap applied. The patient  awakened, and taken to recovery room without difficulty.   Gean Birchwood J 06/24/2015, 2:45 PM

## 2015-06-24 NOTE — Anesthesia Preprocedure Evaluation (Addendum)
Anesthesia Evaluation  Patient identified by MRN, date of birth, ID band Patient awake    Reviewed: Allergy & Precautions, NPO status , Patient's Chart, lab work & pertinent test results  History of Anesthesia Complications (+) PONV  Airway Mallampati: II  TM Distance: >3 FB Neck ROM: Full    Dental   Pulmonary former smoker,    breath sounds clear to auscultation       Cardiovascular hypertension,  Rhythm:Regular Rate:Normal     Neuro/Psych    GI/Hepatic negative GI ROS, Neg liver ROS,   Endo/Other  negative endocrine ROS  Renal/GU negative Renal ROS     Musculoskeletal  (+) Arthritis ,   Abdominal   Peds  Hematology   Anesthesia Other Findings   Reproductive/Obstetrics                            Anesthesia Physical Anesthesia Plan  ASA: III  Anesthesia Plan: General   Post-op Pain Management:    Induction: Intravenous  Airway Management Planned: Oral ETT  Additional Equipment:   Intra-op Plan:   Post-operative Plan: Extubation in OR  Informed Consent: I have reviewed the patients History and Physical, chart, labs and discussed the procedure including the risks, benefits and alternatives for the proposed anesthesia with the patient or authorized representative who has indicated his/her understanding and acceptance.   Dental advisory given  Plan Discussed with: CRNA and Anesthesiologist  Anesthesia Plan Comments:         Anesthesia Quick Evaluation

## 2015-06-24 NOTE — Anesthesia Postprocedure Evaluation (Signed)
Anesthesia Post Note  Patient: Kathy Coleman  Procedure(s) Performed: Procedure(s) (LRB): TOTAL KNEE ARTHROPLASTY (Right)  Patient location during evaluation: PACU Anesthesia Type: General Level of consciousness: awake Pain management: pain level controlled Vital Signs Assessment: post-procedure vital signs reviewed and stable Respiratory status: spontaneous breathing Cardiovascular status: stable Anesthetic complications: no    Last Vitals:  Filed Vitals:   06/24/15 1102  BP: 138/69  Pulse: 81  Temp: 36.7 C  Resp: 20    Last Pain:  Filed Vitals:   06/24/15 1112  PainSc: 8                  EDWARDS,

## 2015-06-25 ENCOUNTER — Encounter (HOSPITAL_COMMUNITY): Payer: Self-pay | Admitting: Orthopedic Surgery

## 2015-06-25 LAB — BASIC METABOLIC PANEL
Anion gap: 6 (ref 5–15)
BUN: 12 mg/dL (ref 6–20)
CALCIUM: 8.8 mg/dL — AB (ref 8.9–10.3)
CHLORIDE: 100 mmol/L — AB (ref 101–111)
CO2: 26 mmol/L (ref 22–32)
CREATININE: 1.04 mg/dL — AB (ref 0.44–1.00)
GFR calc non Af Amer: 57 mL/min — ABNORMAL LOW (ref >60)
Glucose, Bld: 147 mg/dL — ABNORMAL HIGH (ref 65–99)
Potassium: 4.8 mmol/L (ref 3.5–5.1)
SODIUM: 132 mmol/L — AB (ref 135–145)

## 2015-06-25 LAB — CBC
HEMATOCRIT: 29.7 % — AB (ref 36.0–46.0)
Hemoglobin: 9.6 g/dL — ABNORMAL LOW (ref 12.0–15.0)
MCH: 30 pg (ref 26.0–34.0)
MCHC: 32.3 g/dL (ref 30.0–36.0)
MCV: 92.8 fL (ref 78.0–100.0)
Platelets: 299 10*3/uL (ref 150–400)
RBC: 3.2 MIL/uL — ABNORMAL LOW (ref 3.87–5.11)
RDW: 13 % (ref 11.5–15.5)
WBC: 7.6 10*3/uL (ref 4.0–10.5)

## 2015-06-25 NOTE — Progress Notes (Signed)
Patient ID: Kathy Coleman, female   DOB: Jun 01, 1956, 59 y.o.   MRN: [id number] PATIENT ID: Kathy Coleman  MRN: [id number]  DOB/AGE:  Jun 01, 1956 / 59 y.o.  1 Day Post-Op Procedure(s) (LRB): TOTAL KNEE ARTHROPLASTY (Right)    PROGRESS NOTE Subjective: Patient is alert, oriented, no Nausea, no Vomiting, yes passing gas. Taking PO well. Denies SOB, Chest or Calf Pain. Using Incentive Spirometer, PAS in place. Ambulate in room, CPM 0-40 Patient reports pain as 5/10 .    Objective: Vital signs in last 24 hours: Filed Vitals:   06/25/15 0123 06/25/15 0124 06/25/15 0138 06/25/15 0530  BP: 101/53 89/49 105/62 107/52  Pulse: 73  74 66  Temp: 98.9 F (37.2 C)   98 F (36.7 C)  TempSrc: Oral   Oral  Resp: 18   18  Height:      Weight:      SpO2: 96%   100%      Intake/Output from previous day: I/O last 3 completed shifts: In: 3041.7 [I.V.:3041.7] Out: 300 [Blood:300]   Intake/Output this shift:     LABORATORY DATA:  Recent Labs  06/25/15 0600  WBC 7.6  HGB 9.6*  HCT 29.7*  PLT 299  NA 132*  K 4.8  CL 100*  CO2 26  BUN 12  CREATININE 1.04*  GLUCOSE 147*  CALCIUM 8.8*    Examination: Neurologically intact ABD soft Neurovascular intact Sensation intact distally Intact pulses distally Dorsiflexion/Plantar flexion intact Incision: no drainage No cellulitis present Compartment soft}  Assessment:   1 Day Post-Op Procedure(s) (LRB): TOTAL KNEE ARTHROPLASTY (Right) ADDITIONAL DIAGNOSIS: Expected Acute Blood Loss Anemia,   Plan: PT/OT WBAT, CPM 5/hrs day until ROM 0-90 degrees, then D/C CPM DVT Prophylaxis:  SCDx72hrs, ASA 325 mg BID x 2 weeks DISCHARGE PLAN: Home DISCHARGE NEEDS: HHPT, CPM, Walker and 3-in-1 comode seat     , J 06/25/2015, 7:15 AM

## 2015-06-25 NOTE — Evaluation (Signed)
Occupational Therapy Evaluation Patient Details Name: Kathy Coleman MRN: [id number] DOB: [birth year] Today's Date: 06/25/2015    History of Present Illness pt admitted for R TKA. PMHx of 3 back surgeries, 2 neck surgeries, OA, HTN, R direct anterior THA 6 weeks ago   Clinical Impression   Patient presenting with decreased ADL and functional mobility independence secondary to above. Patient overall mod I to occasional min assist with LB ADLs PTA. Patient currently functioning at an overall supervision to min assist level for functional mobility using RW and up to mod assist for LB ADLs. Patient will benefit from acute OT to increase overall independence in the areas of ADLs, functional mobility, and overall safety in order to safely discharge home with husband.     Follow Up Recommendations  No OT follow up;Supervision - Intermittent    Equipment Recommendations  None recommended by OT    Recommendations for Other Services  none at this time    Precautions / Restrictions Precautions Precautions: Knee Precaution Comments: reviewed no pillow under knee and zero knee foam  Restrictions Weight Bearing Restrictions: Yes RLE Weight Bearing: Weight bearing as tolerated      Mobility Bed Mobility Overal bed mobility: Needs Assistance Bed Mobility: Supine to Sit     Supine to sit: Min assist     General bed mobility comments: min assist provided by patient's husband   Transfers Overall transfer level: Needs assistance Equipment used: Rolling walker (2 wheeled) Transfers: Sit to/from Stand Sit to Stand: Min guard         General transfer comment: Min guard for initial stand from bed. Pt able to perform sit to/from stand from [facility] with supervision. Minimal cueing needed for safety, hand placement, and technique.     Balance Overall balance assessment: Needs assistance Sitting-balance support: No upper extremity supported;Feet supported Sitting balance-Leahy Scale: Good     Standing balance support: Bilateral upper extremity supported;During functional activity Standing balance-Leahy Scale: Good    ADL Overall ADL's : Needs assistance/impaired Eating/Feeding: Set up;Sitting   Grooming: Supervision/safety;Standing   Upper Body Bathing: Set up;Sitting   Lower Body Bathing: Moderate assistance;Sit to/from stand   Upper Body Dressing : Set up;Sitting   Lower Body Dressing: Moderate assistance;Sit to/from stand   Toilet Transfer: Min guard;RW;BSC;Ambulation   Toileting- Architect and Hygiene: Supervision/safety;Sit to/from stand       Functional mobility during ADLs: Supervision/safety;Rolling walker General ADL Comments: Patient's husband assisting with LB ADLs due to recent hip surgery. Discussed shower stall transfer with patient and plan to go over this tomorrow as time allows.     Pertinent Vitals/Pain Pain Assessment: Faces Pain Score: 6  Faces Pain Scale: Hurts even more Pain Location: right knee with mobility  Pain Descriptors / Indicators: Aching;Sore Pain Intervention(s): Limited activity within patient's tolerance;Monitored during session;Repositioned;Ice applied     Hand Dominance Right   Extremity/Trunk Assessment Upper Extremity Assessment Upper Extremity Assessment: Overall WFL for tasks assessed   Lower Extremity Assessment Lower Extremity Assessment: Defer to PT evaluation   Cervical / Trunk Assessment Cervical / Trunk Assessment: Normal   Communication Communication Communication: No difficulties   Cognition Arousal/Alertness: Awake/alert Behavior During Therapy: WFL for tasks assessed/performed Overall Cognitive Status: Within Functional Limits for tasks assessed              Home Living Family/patient expects to be discharged to:: Private residence Living Arrangements: Spouse/significant other Available Help at Discharge: Family;Available 24 hours/day;Friend(s) Type of Home: House Home Access:  Level entry  Home Layout: One level     Bathroom Shower/Tub: Walk-in shower;Door   Bathroom Toilet: Handicapped height Bathroom Accessibility: Yes   Home Equipment: Environmental consultantWalker - 2 wheels;Hospital bed;Shower seat;Grab bars - tub/shower;Adaptive equipment;Cane - single point;Bedside commode Adaptive Equipment: Reacher        Prior Functioning/Environment Level of Independence: Independent with assistive device(s)     OT Diagnosis: Generalized weakness;Acute pain   OT Problem List: Decreased strength;Decreased range of motion;Decreased activity tolerance;Decreased safety awareness;Pain   OT Treatment/Interventions: Self-care/ADL training;Therapeutic exercise;Energy conservation;DME and/or AE instruction;Therapeutic activities;Patient/family education;Balance training    OT Goals(Current goals can be found in the care plan section) Acute Rehab OT Goals Patient Stated Goal: go home soon OT Goal Formulation: With patient/family Time For Goal Achievement: 07/09/15 Potential to Achieve Goals: Good ADL Goals Pt Will Perform Grooming: with modified independence;standing Pt Will Perform Lower Body Bathing: with modified independence;sit to/from stand Pt Will Perform Lower Body Dressing: with modified independence;sit to/from stand Pt Will Transfer to Toilet: with modified independence;ambulating;bedside commode Pt Will Perform Tub/Shower Transfer: Shower transfer;ambulating;rolling walker;shower seat;with modified independence Additional ADL Goal #1: Pt will be mod I with functional mobility/ambulation using RW  OT Frequency: Min 2X/week   Barriers to D/C: none known at this time   End of Session Equipment Utilized During Treatment: Rolling walker CPM Right Knee CPM Right Knee: Off  Activity Tolerance: Patient tolerated treatment well Patient left: in chair;with call bell/phone within reach;with family/visitor present   Time: [id number] OT Time Calculation (min): 17 min Charges:   OT General Charges $OT Visit: 1 Procedure OT Evaluation $Initial OT Evaluation Tier I: 1 Procedure  Edwin CapPatricia  , MS, OTR/L, CLT Pager: [phone]  06/25/2015, 3:43 PM

## 2015-06-25 NOTE — Care Management Note (Signed)
Case Management Note  Patient Details  Name: Kathy MediciMyra L Coleman MRN: [id number] Date of Birth: Jul 22, 1955  Subjective/Objective:          S/p left total knee arthroplasty           Action/Plan: Spoke with patient about discharge plan, she [facility]. Contacted Linda at [facility], set up HHPT, they will be able to start service on 06/27/15. Faxed demographics, HHPT order, face to face, H and P ,op note and PT eval to [phone] and received confirmation.Patient stated that she has a rolling walker and 3N1 , [employer] will deliver CPM to home. Patient stated that she will have family to assist her after discharge.     Expected Discharge Date:                  Expected Discharge Plan:  Home w Home Health Services  In-House Referral:  NA  Discharge planning Services  CM Consult  Post Acute Care Choice:  Durable Medical Equipment, Home Health Choice offered to:  Patient  DME Arranged:  CPM DME Agency:  [employer]  HH Arranged:  PT HH Agency[facility]  [facility]  Status of Service:  Completed, signed off  Medicare Important Message Given:    Date Medicare IM Given:    Medicare IM give by:    Date Additional Medicare IM Given:    Additional Medicare Important Message give by:     If discussed at Long Length of Stay Meetings, dates discussed:    Additional Comments:  Monica BectonKrieg,  Watson, RN 06/25/2015, 3:44 PM

## 2015-06-25 NOTE — Progress Notes (Signed)
Orthopedic Tech Progress Note Patient Details:  Kathy Coleman [birth year] [id number]  Patient ID: Kathy Coleman, female   DOB: [birth year], 59 y.o.   MRN: [id number] Placed pt's rle on cpm @ 0-50 degrees @1340   Nikki DomCrawford,  06/25/2015, 1:35 PM

## 2015-06-25 NOTE — Evaluation (Signed)
Physical Therapy Evaluation Patient Details Name: Kathy MediciMyra L Coleman MRN: [id number] DOB: [birth year] Today's Date: 06/25/2015   History of Present Illness  pt admitted for R TKA. PMHx of 3 back surgeries, 2 neck surgeries, OA, HTN, R direct anterior THA 6 weeks ago  Clinical Impression  Mrs.Keattes is familiar from R THA and continues to move well. Pt in CPM 0-30 on arrival, able to ambulate in hall, initiate HEp and perform transfers well. Pt educated for CPM use, bone foam, HEP, progression and plan. Pt with decreased strength and mobility who will benefit from acute therapy to maximize mobility, function and gait to decrease burden of care.     Follow Up Recommendations Home health PT    Equipment Recommendations  None recommended by PT    Recommendations for Other Services       Precautions / Restrictions Precautions Precautions: Knee Restrictions RLE Weight Bearing: Weight bearing as tolerated      Mobility  Bed Mobility Overal bed mobility: Modified Independent                Transfers Overall transfer level: Needs assistance   Transfers: Sit to/from Stand Sit to Stand: Supervision         General transfer comment: cues for hand placement, sequence and safety  Ambulation/Gait Ambulation/Gait assistance: Supervision Ambulation Distance (Feet): 130 Feet Assistive device: Rolling walker (2 wheeled) Gait Pattern/deviations: Step-to pattern;Decreased stride length   Gait velocity interpretation: Below normal speed for age/gender General Gait Details: cues for sequence and posture, decreased heel strike RLE  Stairs            Wheelchair Mobility    Modified Rankin (Stroke Patients Only)       Balance                                             Pertinent Vitals/Pain Pain Assessment: 0-10 Pain Score: 4  Pain Location: right knee Pain Descriptors / Indicators: Aching Pain Intervention(s): Limited activity within patient's  tolerance;Monitored during session;Premedicated before session;Repositioned;Ice applied    Home Living Family/patient expects to be discharged to:: Private residence Living Arrangements: Spouse/significant other Available Help at Discharge: Family;Available 24 hours/day;Friend(s) Type of Home: House Home Access: Level entry     Home Layout: One level Home Equipment: Walker - 2 wheels;Hospital bed;Shower seat;Grab bars - tub/shower;Adaptive equipment;Cane - single point;Bedside commode      Prior Function Level of Independence: Independent with assistive device(s)               Hand Dominance   Dominant Hand: Right    Extremity/Trunk Assessment   Upper Extremity Assessment: Overall WFL for tasks assessed           Lower Extremity Assessment: RLE deficits/detail RLE Deficits / Details: limited strength and ROM as expected post op    Cervical / Trunk Assessment: Normal  Communication   Communication: No difficulties  Cognition Arousal/Alertness: Awake/alert Behavior During Therapy: WFL for tasks assessed/performed Overall Cognitive Status: Within Functional Limits for tasks assessed                      General Comments      Exercises Total Joint Exercises Ankle Circles/Pumps: AROM;Seated;Right;5 reps Quad Sets: AROM;Right;5 reps;Supine Heel Slides: AAROM;Right;5 reps;Supine      Assessment/Plan    PT Assessment Patient needs continued PT services  PT  Diagnosis Difficulty walking;Acute pain   PT Problem List Decreased strength;Decreased range of motion;Decreased activity tolerance;Decreased mobility;Pain;Decreased knowledge of use of DME  PT Treatment Interventions DME instruction;Gait training;Functional mobility training;Therapeutic activities;Therapeutic exercise;Patient/family education   PT Goals (Current goals can be found in the Care Plan section) Acute Rehab PT Goals Patient Stated Goal: return home and care for myself PT Goal  Formulation: With patient/family Time For Goal Achievement: 07/02/15 Potential to Achieve Goals: Good    Frequency 7X/week   Barriers to discharge        Co-evaluation               End of Session   Activity Tolerance: Patient tolerated treatment well Patient left: in chair;with call bell/phone within reach;with family/visitor present Nurse Communication: Mobility status;Weight bearing status         Time: 0454-0981 PT Time Calculation (min) (ACUTE ONLY): 22 min   Charges:   PT Evaluation $Initial PT Evaluation Tier I: 1 Procedure     PT G CodesDelorse Lek 06/25/2015, 7:47 AM Delaney Meigs, PT [phone]

## 2015-06-25 NOTE — Progress Notes (Signed)
Physical Therapy Treatment Patient Details Name: Kathy MediciMyra L Coleman MRN: [id number] DOB: 07/08/1955 Today's Date: 06/25/2015    History of Present Illness pt admitted for R TKA. PMHx of 3 back surgeries, 2 neck surgeries, OA, HTN, R direct anterior THA 6 weeks ago    PT Comments    Pt continues to improve her R LE control, ROM, and her tolerance for ambulation. Pt has good extension but needs to continue to work on knee flexion. Will continue to follow to increase ROM, strength, and functional mobility.   Follow Up Recommendations  Home health PT     Equipment Recommendations       Recommendations for Other Services       Precautions / Restrictions Precautions Precautions: Knee Restrictions RLE Weight Bearing: Weight bearing as tolerated    Mobility  Bed Mobility Overal bed mobility: Modified Independent                Transfers     Transfers: Sit to/from Stand Sit to Stand: Supervision         General transfer comment: cues for hand placement, sequence and safety  Ambulation/Gait Ambulation/Gait assistance: Supervision Ambulation Distance (Feet): 250 Feet Assistive device: Rolling walker (2 wheeled) Gait Pattern/deviations: Step-through pattern;Decreased stride length   Gait velocity interpretation: Below normal speed for age/gender General Gait Details: pt with decreased knee flexion in swing phase on RLE with cues for heel strike, toe off, and keeping head up   Stairs            Wheelchair Mobility    Modified Rankin (Stroke Patients Only)       Balance                                    Cognition Arousal/Alertness: Awake/alert Behavior During Therapy: WFL for tasks assessed/performed Overall Cognitive Status: Within Functional Limits for tasks assessed                      Exercises Total Joint Exercises Quad Sets: AROM;Right;10 reps;Supine Heel Slides: AROM;Right;10 reps;Supine Straight Leg Raises:  AAROM;Right;10 reps;Supine Long Arc Quad: AROM;Right;10 reps;Seated Goniometric ROM: 1-53 Marching in Standing: AROM;Right;Seated;10 reps    General Comments        Pertinent Vitals/Pain Pain Assessment: 0-10 Pain Score: 6  Pain Location: R knee Pain Descriptors / Indicators: Aching Pain Intervention(s): Monitored during session;Repositioned;Limited activity within patient's tolerance    Home Living                      Prior Function            PT Goals (current goals can now be found in the care plan section) Progress towards PT goals: Progressing toward goals    Frequency       PT Plan Current plan remains appropriate    Co-evaluation             End of Session Equipment Utilized During Treatment: Gait belt Activity Tolerance: Patient tolerated treatment well       Time: 4098-11911219-1240 PT Time Calculation (min) (ACUTE ONLY): 21 min  Charges:  $Gait Training: 8-22 mins                    G CodesJimmy Picket:        06/25/2015, 1:29 PM Jimmy Picket , SPT 06/25/2015 1:29 PM

## 2015-06-25 NOTE — Progress Notes (Signed)
Utilization review completed.  , RN, BSN. 

## 2015-06-25 NOTE — Progress Notes (Signed)
Orthopedic Tech Progress Note Patient Details:  Teodora MediciMyra L Saintjean 29-Apr-1956 [id number] On cpm at 1930 Patient ID: Teodora MediciMyra L Offenberger, female   DOB: 29-Apr-1956, 59 y.o.   MRN: [id number]   Jennye MoccasinHughes,  Craig 06/25/2015, 7:29 PM

## 2015-06-26 LAB — CBC
HEMATOCRIT: 25 % — AB (ref 36.0–46.0)
Hemoglobin: 8.4 g/dL — ABNORMAL LOW (ref 12.0–15.0)
MCH: 30.7 pg (ref 26.0–34.0)
MCHC: 33.6 g/dL (ref 30.0–36.0)
MCV: 91.2 fL (ref 78.0–100.0)
PLATELETS: 197 10*3/uL (ref 150–400)
RBC: 2.74 MIL/uL — ABNORMAL LOW (ref 3.87–5.11)
RDW: 13.1 % (ref 11.5–15.5)
WBC: 4.8 10*3/uL (ref 4.0–10.5)

## 2015-06-26 NOTE — Progress Notes (Signed)
Anum L Venkatesh discharged home per MD order. Discharge instructions reviewed and discussed with patient. All questions and concerns answered. Copy of instructions and scripts given to patient. IV removed.  Patient escorted to car by staff in a wheelchair. No distress noted upon discharge.   Rosita FireScott,  R 06/26/2015 12:55 PM

## 2015-06-26 NOTE — Discharge Summary (Signed)
Patient ID: Kathy Coleman MRN: [id number] DOB/AGE: 11/03/1955 59 y.o.  Admit date: 06/24/2015 Discharge date: 06/26/2015  Admission Diagnoses:  Principal Problem:   Primary osteoarthritis of left knee Active Problems:   Arthritis of knee   Discharge Diagnoses:  Same  Past Medical History  Diagnosis Date  . PONV (postoperative nausea and vomiting)   . Hypertension   . Arthritis     Surgeries: Procedure(s): TOTAL KNEE ARTHROPLASTY on 06/24/2015   Consultants:    Discharged Condition: Improved  Hospital Course: Kathy MediciMyra L Evetts is an 59 y.o. female who was admitted 06/24/2015 for operative treatment ofPrimary osteoarthritis of left knee. Patient has severe unremitting pain that affects sleep, daily activities, and work/hobbies. After pre-op clearance the patient was taken to the operating room on 06/24/2015 and underwent  Procedure(s): TOTAL KNEE ARTHROPLASTY.    Patient was given perioperative antibiotics: Anti-infectives    Start     Dose/Rate Route Frequency Ordered Stop   06/24/15 1405  cefUROXime (ZINACEF) injection  Status:  Discontinued       As needed 06/24/15 1406 06/24/15 1520   06/24/15 0900  ceFAZolin (ANCEF) IVPB 2 g/50 mL premix     2 g 100 mL/hr over 30 Minutes Intravenous To ShortStay Surgical 06/23/15 1537 06/24/15 1336       Patient was given sequential compression devices, early ambulation, and chemoprophylaxis to prevent DVT.  Patient benefited maximally from hospital stay and there were no complications.    Recent vital signs: Patient Vitals for the past 24 hrs:  BP Temp Temp src Pulse Resp SpO2  06/26/15 0559 (!) 105/50 mmHg 98.3 F (36.8 C) Oral 83 18 100 %  06/25/15 2140 (!) 96/43 mmHg 98.1 F (36.7 C) Oral 71 18 94 %  06/25/15 1300 115/60 mmHg 98 F (36.7 C) - 74 18 100 %     Recent laboratory studies:  Recent Labs  06/25/15 0600 06/26/15 0509  WBC 7.6 4.8  HGB 9.6* 8.4*  HCT 29.7* 25.0*  PLT 299 197  NA 132*  --   K 4.8  --    CL 100*  --   CO2 26  --   BUN 12  --   CREATININE 1.04*  --   GLUCOSE 147*  --   CALCIUM 8.8*  --      Discharge Medications:     Medication List    STOP taking these medications        indomethacin 50 MG capsule  Commonly known as:  INDOCIN      TAKE these medications        aspirin EC 325 MG tablet  Take 1 tablet (325 mg total) by mouth 2 (two) times daily.     atorvastatin 20 MG tablet  Commonly known as:  LIPITOR  Take 20 mg by mouth daily.     ENDOCET 10-325 MG tablet  Generic drug:  oxyCODONE-acetaminophen  Take 1 tablet by mouth every 4 (four) hours as needed. pain     gabapentin 300 MG capsule  Commonly known as:  NEURONTIN  Take 300 mg by mouth 3 (three) times daily.     lisinopril 20 MG tablet  Commonly known as:  PRINIVIL,ZESTRIL  Take 20 mg by mouth daily.     methocarbamol 500 MG tablet  Commonly known as:  ROBAXIN  Take 1 tablet (500 mg total) by mouth 2 (two) times daily with a meal.     traMADol 50 MG tablet  Commonly known as:  Janean SarkULTRAM  Take 100 mg by mouth every 8 (eight) hours as needed for moderate pain.        Diagnostic Studies: No results found.  Disposition: 06-Home-Health Care Svc      Discharge Instructions    CPM    Complete by:  As directed   Continuous passive motion machine (CPM):      Use the CPM from 0 to 60  for 5 hours per day.      You may increase by 10 degrees per day.  You may break it up into 2 or 3 sessions per day.      Use CPM for 2 weeks or until you are told to stop.     Call MD / Call 911    Complete by:  As directed   If you experience chest pain or shortness of breath, CALL 911 and be transported to the hospital emergency room.  If you develope a fever above 101 F, pus (white drainage) or increased drainage or redness at the wound, or calf pain, call your surgeon's office.     Change dressing    Complete by:  As directed   Change dressing on 5, then change the dressing daily with sterile 4 x 4 inch  gauze dressing and apply TED hose.  You may clean the incision with alcohol prior to redressing.     Constipation Prevention    Complete by:  As directed   Drink plenty of fluids.  Prune juice may be helpful.  You may use a stool softener, such as Colace (over the counter) 100 mg twice a day.  Use MiraLax (over the counter) for constipation as needed.     Diet - low sodium heart healthy    Complete by:  As directed      Driving restrictions    Complete by:  As directed   No driving for 2 weeks     Increase activity slowly as tolerated    Complete by:  As directed      Patient may shower    Complete by:  As directed   You may shower without a dressing once there is no drainage.  Do not wash over the wound.  If drainage remains, cover wound with plastic wrap and then shower.           Follow-up Information    Follow up with [name], MD In 2 weeks.   Specialty:  Orthopedic Surgery   Contact information:   [address] [place] [address] [phone]       Follow up with [facility].   Why:  They will contact you to schedule home therapy visits.   Contact information:   [address] [place] [address] [phone]        Signed: Vear Clock,  R 06/26/2015, 8:09 AM

## 2015-06-26 NOTE — Progress Notes (Signed)
PATIENT ID: Teodora MediciMyra L Garrow  MRN: [id number]  DOB/AGE:  08-May-1956 / 59 y.o.  2 Days Post-Op Procedure(s) (LRB): TOTAL KNEE ARTHROPLASTY (Right)    PROGRESS NOTE Subjective: Patient is alert, oriented, no Nausea, no Vomiting, yes passing gas. Taking PO well. Denies SOB, Chest or Calf Pain. Using Incentive Spirometer, PAS in place. Ambulate WBAT with pt walking 130 ft, CPM 0-60 Patient reports pain as 3-4/10 .    Objective: Vital signs in last 24 hours: Filed Vitals:   06/25/15 0530 06/25/15 1300 06/25/15 2140 06/26/15 0559  BP: 107/52 115/60 96/43 105/50  Pulse: 66 74 71 83  Temp: 98 F (36.7 C) 98 F (36.7 C) 98.1 F (36.7 C) 98.3 F (36.8 C)  TempSrc: Oral  Oral Oral  Resp: 18 18 18 18   Height:      Weight:      SpO2: 100% 100% 94% 100%      Intake/Output from previous day: I/O last 3 completed shifts: In: 2021.7 [P.O.:480; I.V.:1541.7] Out: -    Intake/Output this shift:     LABORATORY DATA:  Recent Labs  06/25/15 0600 06/26/15 0509  WBC 7.6 4.8  HGB 9.6* 8.4*  HCT 29.7* 25.0*  PLT 299 197  NA 132*  --   K 4.8  --   CL 100*  --   CO2 26  --   BUN 12  --   CREATININE 1.04*  --   GLUCOSE 147*  --   CALCIUM 8.8*  --     Examination: Neurologically intact Neurovascular intact Sensation intact distally Intact pulses distally Dorsiflexion/Plantar flexion intact Incision: dressing C/D/I No cellulitis present Compartment soft}  Assessment:   2 Days Post-Op Procedure(s) (LRB): TOTAL KNEE ARTHROPLASTY (Right) ADDITIONAL DIAGNOSIS: Expected Acute Blood Loss Anemia  Plan: PT/OT WBAT, CPM 5/hrs day until ROM 0-90 degrees, then D/C CPM DVT Prophylaxis:  SCDx72hrs, ASA 325 mg BID x 2 weeks DISCHARGE PLAN: Home DISCHARGE NEEDS: HHPT, CPM, Walker and 3-in-1 comode seat     PHILLIPS, ERIC R 06/26/2015, 8:02 AM

## 2015-06-26 NOTE — Progress Notes (Signed)
Occupational Therapy Treatment Patient Details Name: Kathy MediciMyra L Poythress MRN: [id number] DOB: 1955-10-26 Today's Date: 06/26/2015    History of present illness pt admitted for R TKA. PMHx of 3 back surgeries, 2 neck surgeries, OA, HTN, R direct anterior THA 6 weeks ago   OT comments  Patient progressing nicely towards OT goals, continue plan of care for now. Pt practiced simulated walk-in shower transfer using RW, anterior/posterior technique. Encouraged patient to complete LE exercises administered by PT, then don zero foam to promote knee extension.    Follow Up Recommendations  No OT follow up;Supervision - Intermittent    Equipment Recommendations  None recommended by OT    Recommendations for Other Services  None at this time   Precautions / Restrictions Precautions Precautions: Knee Precaution Comments: reviewed no pillow under knee and zero knee foam  Restrictions Weight Bearing Restrictions: Yes RLE Weight Bearing: Weight bearing as tolerated    Mobility Bed Mobility General bed mobility comments: Pt found seated in recliner upon OT entering/exiting room   Transfers Overall transfer level: Needs assistance Equipment used: Rolling walker (2 wheeled) Transfers: Sit to/from Stand Sit to Stand: Supervision General transfer comment: Supervision for safety, cues for hand placement and technique     Balance Overall balance assessment: Needs assistance Sitting-balance support: No upper extremity supported;Feet supported Sitting balance-Leahy Scale: Good     Standing balance support: Bilateral upper extremity supported;During functional activity Standing balance-Leahy Scale: Good   ADL Overall ADL's : Needs assistance/impaired General ADL Comments: Pt ambulated into BR for simulated walk-in shower transfer, anterior/posterior technique using RW. Pt and husband able to verbalize and demonstrate understanding. Educated patient on compensatory technique for LB dressing.  Encouraged pt to work on reaching R foot and encouraged pt to complete ADL as independently as possible.      Cognition   Behavior During Therapy: WFL for tasks assessed/performed Overall Cognitive Status: Within Functional Limits for tasks assessed                 Pertinent Vitals/ Pain       Pain Assessment: Faces Faces Pain Scale: Hurts even more Pain Location: right knee with mobility, especially stand to sits  Pain Descriptors / Indicators: Aching;Sore;Grimacing Pain Intervention(s): Monitored during session;Repositioned   Frequency Min 2X/week     Progress Toward Goals  OT Goals(current goals can now befound in the care plan section)  Progress towards OT goals: Progressing toward goals     Plan Discharge plan remains appropriate    End of Session Equipment Utilized During Treatment: Rolling walker CPM Right Knee CPM Right Knee: Off   Activity Tolerance Patient tolerated treatment well   Patient Left in chair;with call bell/phone within reach;with family/visitor present     Time: 9528-41320856-0912 OT Time Calculation (min): 16 min  Charges: OT General Charges $OT Visit: 1 Procedure OT Treatments $Self Care/Home Management : 8-22 mins  Edwin CapPatricia  , MS, OTR/L, CLT Pager: [phone]  06/26/2015, 9:29 AM

## 2015-06-26 NOTE — Progress Notes (Signed)
Physical Therapy Treatment Patient Details Name: Kathy MediciMyra L Kovacic MRN: [id number] DOB: [birth year] Today's Date: 06/26/2015    History of Present Illness pt admitted for R TKA. PMHx of 3 back surgeries, 2 neck surgeries, OA, HTN, R direct anterior THA 6 weeks ago    PT Comments    Patient continues to progress toward mobility goals with ability to ambulate 250 ft with improved gait mechanics. Overall mobility level of supervision/mod I. Pt led through HEP and tolerated exercises well. Pt educated on frequency of HEP, ice application, and use of bone foam. Pt's husband present for session. Current plan remains appropriate.  Follow Up Recommendations        Equipment Recommendations  None recommended by PT    Recommendations for Other Services       Precautions / Restrictions Precautions Precautions: Knee Precaution Comments: reviewed no pillow under knee and zero knee foam  Restrictions Weight Bearing Restrictions: Yes RLE Weight Bearing: Weight bearing as tolerated    Mobility  Bed Mobility Overal bed mobility: Modified Independent Bed Mobility: Supine to Sit     Supine to sit: Modified independent (Device/Increase time)     General bed mobility comments: HOB flat and no use of bed rails;pt assisted R LE to EOB with use of sheet; no physical assist needed  Transfers Overall transfer level: Needs assistance Equipment used: Rolling walker (2 wheeled) Transfers: Sit to/from Stand Sit to Stand: Supervision         General transfer comment: carry over of hand placement and safe technique  Ambulation/Gait Ambulation/Gait assistance: Supervision Ambulation Distance (Feet): 250 Feet Assistive device: Rolling walker (2 wheeled) Gait Pattern/deviations: Step-through pattern Gait velocity: decreased   General Gait Details: cues for engaging R quad before WS onto R LE for increased stability; carry over of increased R knee flexion during initial swing phase   Stairs             Wheelchair Mobility    Modified Rankin (Stroke Patients Only)       Balance Overall balance assessment: Needs assistance Sitting-balance support: No upper extremity supported Sitting balance-Leahy Scale: Good     Standing balance support: During functional activity Standing balance-Leahy Scale: Good Standing balance comment: RW for support                    Cognition Arousal/Alertness: Awake/alert Behavior During Therapy: WFL for tasks assessed/performed Overall Cognitive Status: Within Functional Limits for tasks assessed                      Exercises Total Joint Exercises Quad Sets: AROM;Right;10 reps;Supine Heel Slides: AROM;Right;10 reps;Supine Hip ABduction/ADduction: AROM;Right;10 reps Straight Leg Raises: AAROM;Right;10 reps;Supine Knee Flexion: AROM;Right;10 reps;Seated Goniometric ROM: 1-55 Marching in Standing: AROM;Right;Seated;10 reps    General Comments        Pertinent Vitals/Pain Pain Assessment: Faces Faces Pain Scale: Hurts even more Pain Location: R knee Pain Descriptors / Indicators: Aching;Sore Pain Intervention(s): Limited activity within patient's tolerance;Monitored during session;Premedicated before session    Home Living                      Prior Function            PT Goals (current goals can now be found in the care plan section) Acute Rehab PT Goals Patient Stated Goal: go home PT Goal Formulation: With patient/family Time For Goal Achievement: 07/02/15 Potential to Achieve Goals: Good Progress towards PT goals: Progressing toward  goals    Frequency  7X/week    PT Plan Current plan remains appropriate    Co-evaluation             End of Session Equipment Utilized During Treatment: Gait belt Activity Tolerance: Patient tolerated treatment well Patient left: in chair;with call bell/phone within reach;with family/visitor present     Time: 1011-1040 PT Time Calculation  (min) (ACUTE ONLY): 29 min  Charges:  $Gait Training: 8-22 mins $Therapeutic Exercise: 8-22 mins                    G Codes:      Derek Mound, PTA Pager: [phone]   06/26/2015, 10:59 AM

## 2016-01-21 ENCOUNTER — Other Ambulatory Visit: Payer: Self-pay

## 2016-01-21 ENCOUNTER — Encounter: Payer: Self-pay | Admitting: Internal Medicine

## 2016-01-21 ENCOUNTER — Ambulatory Visit (INDEPENDENT_AMBULATORY_CARE_PROVIDER_SITE_OTHER): Payer: Medicare FFS | Admitting: Internal Medicine

## 2016-01-21 VITALS — BP 140/75 | HR 71 | Temp 98.0°F | Ht 65.0 in | Wt 164.8 lb

## 2016-01-21 DIAGNOSIS — Z1212 Encounter for screening for malignant neoplasm of rectum: Secondary | ICD-10-CM | POA: Diagnosis not present

## 2016-01-21 DIAGNOSIS — Z1211 Encounter for screening for malignant neoplasm of colon: Secondary | ICD-10-CM | POA: Diagnosis not present

## 2016-01-21 DIAGNOSIS — R1310 Dysphagia, unspecified: Secondary | ICD-10-CM

## 2016-01-21 DIAGNOSIS — R1314 Dysphagia, pharyngoesophageal phase: Secondary | ICD-10-CM

## 2016-01-21 DIAGNOSIS — R1319 Other dysphagia: Secondary | ICD-10-CM

## 2016-01-21 MED ORDER — NA SULFATE-K SULFATE-MG SULF 17.5-3.13-1.6 GM/177ML PO SOLN: 1.0000 | ORAL | Status: DC

## 2016-01-21 NOTE — Progress Notes (Signed)
Primary Care Physician:  Louie BostonAPPER,DAVID B, MD Primary Gastroenterologist:  Dr. Jena Gaussourk  Pre-Procedure History & Physical: HPI:  Kathy MediciMyra L Coleman is a 60 y.o. female here for consideration scheduling a screening colonoscopy. Negative colonoscopy (diverticulosis)  10 years ago. No family history of colon cancer. No bowel symptoms aside from chronic constipation managed with occasional MiraLax and stool softeners. Patient has recent recurrent esophageal dysphagia solids - states worse since recent cervical spine surgery. No significant reflux symptoms perceived recently. No alcohol or tobacco exposure. She states after her esophagus was empirically dilated 10 years ago (appeared normal) used she had a durable response to dilation at that time. Family history is significant in that her son has eosinophilic esophagitis.  Weight is down 22 pounds since 2007.   Past Medical History  Diagnosis Date  . PONV (postoperative nausea and vomiting)   . Hypertension   . Arthritis     Past Surgical History  Procedure Laterality Date  . Foot surgery      RIGHT  2014  . Cholecystectomy    . Abdominal hysterectomy    . Back surgery      1998  , 2010  LOW BACK    . Neck surgery      2005, 2010  . Total hip arthroplasty Right 05/13/2015    Procedure: TOTAL HIP ARTHROPLASTY ANTERIOR APPROACH;  Surgeon: Gean BirchwoodFrank Rowan, MD;  Location: [facility] OR;  Service: Orthopedics;  Laterality: Right;  . Total knee arthroplasty Right 06/24/2015    Procedure: TOTAL KNEE ARTHROPLASTY;  Surgeon: Gean BirchwoodFrank Rowan, MD;  [facility];  Service: Orthopedics;  Laterality: Right;  . Colonoscopy  03/24/2006    Dr.- a couple of anal papillae, o/w normal rectum with L sided diverticula, the remainder of the colonic mucosa appeared normal.   . Esophagogastroduodenoscopy  03/24/2006    Dr.- normal esophagus. a couple of antral erosions, o/w normal stomach, patent pylorus, normal D1,D2    Prior to Admission medications   Medication Sig  Start Date End Date Taking? Authorizing Provider  atorvastatin (LIPITOR) 20 MG tablet Take 20 mg by mouth daily.   Yes Historical Provider, MD  ENDOCET 10-325 MG tablet Take 1 tablet by mouth every 4 (four) hours as needed. pain 06/24/15  Yes Allena KatzEric K Phillips, PA-C  gabapentin (NEURONTIN) 300 MG capsule Take 300 mg by mouth 3 (three) times daily.   Yes Historical Provider, MD  indomethacin (INDOCIN) 50 MG capsule Take 50 mg by mouth 2 (two) times daily with a meal.   Yes Historical Provider, MD  lisinopril (PRINIVIL,ZESTRIL) 20 MG tablet Take 20 mg by mouth daily.   Yes Historical Provider, MD  NON FORMULARY Stool softner   One daily   Yes Historical Provider, MD  polyethylene glycol (MIRALAX / GLYCOLAX) packet Take 17 g by mouth daily.   Yes Historical Provider, MD  traMADol (ULTRAM) 50 MG tablet Take 100 mg by mouth every 8 (eight) hours as needed for moderate pain.    Yes Historical Provider, MD  aspirin EC 325 MG tablet Take 1 tablet (325 mg total) by mouth 2 (two) times daily. Patient not taking: Reported on 01/21/2016 06/24/15   Allena KatzEric K Phillips, PA-C  methocarbamol (ROBAXIN) 500 MG tablet Take 1 tablet (500 mg total) by mouth 2 (two) times daily with a meal. Patient not taking: Reported on 01/21/2016 06/24/15   Allena KatzEric K Phillips, PA-C    Allergies as of 01/21/2016  . (No Known Allergies)    No family history on  file.  Social History   Social History  . Marital Status: Married    Spouse Name: N/A  . Number of Children: N/A  . Years of Education: N/A   Occupational History  . Not on file.   Social History Main Topics  . Smoking status: Former Games developermoker  . Smokeless tobacco: Not on file     Comment: Quit x 30 years  . Alcohol Use: No  . Drug Use: No  . Sexual Activity: Not on file   Other Topics Concern  . Not on file   Social History Narrative    Review of Systems: See HPI, otherwise negative ROS  Physical Exam: BP 140/75 mmHg  Pulse 71  Temp(Src) 98 F (36.7 C)  (Oral)  Ht 5\' 5"  (1.651 m)  Wt 164 lb 12.8 oz (74.753 kg)  BMI 27.42 kg/m2 General:   Alert,  Well-developed, well-nourished, pleasant and cooperative in NAD Skin:  Intact without significant lesions or rashes. Eyes:  Sclera clear, no icterus.   Conjunctiva pink. Ears:  Normal auditory acuity. Nose:  No deformity, discharge,  or lesions. Mouth:  No deformity or lesions. Neck:  Supple; no masses or thyromegaly. No significant cervical adenopathy. Lungs:  Clear throughout to auscultation.   No wheezes, crackles, or rhonchi. No acute distress. Heart:  Regular rate and rhythm; no murmurs, clicks, rubs,  or gallops. Abdomen: Non-distended, normal bowel sounds.  Soft and nontender without appreciable mass or hepatosplenomegaly.  Pulses:  Normal pulses noted. Extremities:  Without clubbing or edema. Rectal:  Deferred to time of colonoscopy   Impression:   Pleasant 60 year old lady with insidiously recurrent esophageal dysphagia. She has had a durable response to empiric dilation of a normal-appearing esophagus 10 years ago. Minimal, if any reflux, symptoms over the years. No other alarm symptoms. . Recent cervical spine surgery may or may not have anything to do with her perceived dysphagia symptoms. Family history positive for eosinophilic esophagitis.  As a separate issue, she is due for average risk colorectal cancer screening.  Recommendations:   I have offered the patient an EGD with esophageal dilation as feasible/appropriate as well as an average risk screening colonoscopy in the near future.The risks, benefits, limitations, imponderables and alternatives regarding both EGD and colonoscopy have been reviewed with the patient. Questions have been answered. All parties agreeable. .     Notice: This dictation was prepared with Dragon dictation along with smaller phrase technology. Any transcriptional errors that result from this process are unintentional and may not be corrected upon review.

## 2016-01-21 NOTE — Patient Instructions (Signed)
Schedule EGD with ED - esophageal dysphagia and screening colonoscopy - conscious sedation

## 2016-02-13 ENCOUNTER — Encounter (HOSPITAL_COMMUNITY): Payer: Self-pay

## 2016-02-13 ENCOUNTER — Encounter (HOSPITAL_COMMUNITY)
Admission: RE | Disposition: A | Discharge status: Home / Self Care | Payer: Self-pay | Source: Ambulatory Visit | Attending: Internal Medicine

## 2016-02-13 ENCOUNTER — Ambulatory Visit (HOSPITAL_COMMUNITY)
Admission: RE | Admit: 2016-02-13 | Discharge: 2016-02-13 | Disposition: A | Discharge status: Home / Self Care | Payer: Medicare FFS | Source: Ambulatory Visit | Attending: Internal Medicine | Admitting: Internal Medicine

## 2016-02-13 ENCOUNTER — Encounter (HOSPITAL_COMMUNITY): Payer: Self-pay | Admitting: *Deleted

## 2016-02-13 DIAGNOSIS — K5909 Other constipation: Secondary | ICD-10-CM | POA: Diagnosis not present

## 2016-02-13 DIAGNOSIS — I10 Essential (primary) hypertension: Secondary | ICD-10-CM | POA: Insufficient documentation

## 2016-02-13 DIAGNOSIS — Z7982 Long term (current) use of aspirin: Secondary | ICD-10-CM | POA: Diagnosis not present

## 2016-02-13 DIAGNOSIS — K5730 Diverticulosis of large intestine without perforation or abscess without bleeding: Secondary | ICD-10-CM | POA: Diagnosis not present

## 2016-02-13 DIAGNOSIS — Z79899 Other long term (current) drug therapy: Secondary | ICD-10-CM | POA: Diagnosis not present

## 2016-02-13 DIAGNOSIS — K6389 Other specified diseases of intestine: Secondary | ICD-10-CM | POA: Diagnosis not present

## 2016-02-13 DIAGNOSIS — Z8379 Family history of other diseases of the digestive system: Secondary | ICD-10-CM | POA: Insufficient documentation

## 2016-02-13 DIAGNOSIS — Z87891 Personal history of nicotine dependence: Secondary | ICD-10-CM | POA: Diagnosis not present

## 2016-02-13 DIAGNOSIS — Z1211 Encounter for screening for malignant neoplasm of colon: Secondary | ICD-10-CM | POA: Insufficient documentation

## 2016-02-13 DIAGNOSIS — K3189 Other diseases of stomach and duodenum: Secondary | ICD-10-CM

## 2016-02-13 DIAGNOSIS — Z9049 Acquired absence of other specified parts of digestive tract: Secondary | ICD-10-CM | POA: Diagnosis not present

## 2016-02-13 DIAGNOSIS — R1310 Dysphagia, unspecified: Secondary | ICD-10-CM | POA: Diagnosis not present

## 2016-02-13 DIAGNOSIS — Z96651 Presence of right artificial knee joint: Secondary | ICD-10-CM | POA: Diagnosis not present

## 2016-02-13 DIAGNOSIS — Z96641 Presence of right artificial hip joint: Secondary | ICD-10-CM | POA: Diagnosis not present

## 2016-02-13 HISTORY — PX: MALONEY DILATION: SHX5535

## 2016-02-13 HISTORY — DX: Gastro-esophageal reflux disease without esophagitis: K21.9

## 2016-02-13 HISTORY — PX: COLONOSCOPY: SHX5424

## 2016-02-13 HISTORY — PX: ESOPHAGOGASTRODUODENOSCOPY: SHX5428

## 2016-02-13 HISTORY — PX: BIOPSY: SHX5522

## 2016-02-13 SURGERY — COLONOSCOPY
Anesthesia: Moderate Sedation

## 2016-02-13 MED ORDER — ONDANSETRON HCL 4 MG/2ML IJ SOLN
INTRAMUSCULAR | Status: DC | PRN
  Administered 2016-02-13: 4 mg via INTRAVENOUS

## 2016-02-13 MED ORDER — SODIUM CHLORIDE 0.9 % IV SOLN
INTRAVENOUS | Status: DC
  Administered 2016-02-13: 07:00:00 via INTRAVENOUS

## 2016-02-13 MED ORDER — MIDAZOLAM HCL 5 MG/5ML IJ SOLN
INTRAMUSCULAR | Status: DC | PRN
  Administered 2016-02-13 (×2): 2 mg via INTRAVENOUS
  Administered 2016-02-13 (×3): 1 mg via INTRAVENOUS

## 2016-02-13 MED ORDER — MEPERIDINE HCL 100 MG/ML IJ SOLN
INTRAMUSCULAR | Status: DC | PRN
  Administered 2016-02-13: 25 mg via INTRAVENOUS
  Administered 2016-02-13 (×2): 50 mg via INTRAVENOUS
  Administered 2016-02-13 (×2): 25 mg via INTRAVENOUS

## 2016-02-13 MED ORDER — MEPERIDINE HCL 100 MG/ML IJ SOLN
INTRAMUSCULAR | Status: AC
  Filled 2016-02-13: qty 2

## 2016-02-13 MED ORDER — STERILE WATER FOR IRRIGATION IR SOLN
Status: DC | PRN
  Administered 2016-02-13: 07:00:00

## 2016-02-13 MED ORDER — ONDANSETRON HCL 4 MG/2ML IJ SOLN
INTRAMUSCULAR | Status: AC
  Filled 2016-02-13: qty 2

## 2016-02-13 MED ORDER — LIDOCAINE VISCOUS 2 % MT SOLN
OROMUCOSAL | Status: DC | PRN
  Administered 2016-02-13: 1 via OROMUCOSAL

## 2016-02-13 MED ORDER — MIDAZOLAM HCL 5 MG/5ML IJ SOLN
INTRAMUSCULAR | Status: AC
  Filled 2016-02-13: qty 10

## 2016-02-13 MED ORDER — LIDOCAINE VISCOUS 2 % MT SOLN
OROMUCOSAL | Status: AC
  Filled 2016-02-13: qty 15

## 2016-02-13 NOTE — Discharge Instructions (Signed)
°Colonoscopy °Discharge Instructions ° °Read the instructions outlined below and refer to this sheet in the next few weeks. These discharge instructions provide you with general information on caring for yourself after you leave the hospital. Your doctor may also give you specific instructions. While your treatment has been planned according to the most current medical practices available, unavoidable complications occasionally occur. If you have any problems or questions after discharge, call Dr.  at [phone]. °ACTIVITY °· You may resume your regular activity, but move at a slower pace for the next 24 hours.  °· Take frequent rest periods for the next 24 hours.  °· Walking will help get rid of the air and reduce the bloated feeling in your belly (abdomen).  °· No driving for 24 hours (because of the medicine (anesthesia) used during the test).   °· Do not sign any important legal documents or operate any machinery for 24 hours (because of the anesthesia used during the test).  °NUTRITION °· Drink plenty of fluids.  °· You may resume your normal diet as instructed by your doctor.  °· Begin with a light meal and progress to your normal diet. Heavy or fried foods are harder to digest and may make you feel sick to your stomach (nauseated).  °· Avoid alcoholic beverages for 24 hours or as instructed.  °MEDICATIONS °· You may resume your normal medications unless your doctor tells you otherwise.  °WHAT YOU CAN EXPECT TODAY °· Some feelings of bloating in the abdomen.  °· Passage of more gas than usual.  °· Spotting of blood in your stool or on the toilet paper.  °IF YOU HAD POLYPS REMOVED DURING THE COLONOSCOPY: °· No aspirin products for 7 days or as instructed.  °· No alcohol for 7 days or as instructed.  °· Eat a soft diet for the next 24 hours.  °FINDING OUT THE RESULTS OF YOUR TEST °Not all test results are available during your visit. If your test results are not back during the visit, make an appointment  with your caregiver to find out the results. Do not assume everything is normal if you have not heard from your caregiver or the medical facility. It is important for you to follow up on all of your test results.  °SEEK IMMEDIATE MEDICAL ATTENTION IF: °· You have more than a spotting of blood in your stool.  °· Your belly is swollen (abdominal distention).  °· You are nauseated or vomiting.  °· You have a temperature over 101.  °· You have abdominal pain or discomfort that is severe or gets worse throughout the day.  °EGD °Discharge instructions °Please read the instructions outlined below and refer to this sheet in the next few weeks. These discharge instructions provide you with general information on caring for yourself after you leave the hospital. Your doctor may also give you specific instructions. While your treatment has been planned according to the most current medical practices available, unavoidable complications occasionally occur. If you have any problems or questions after discharge, please call your doctor. °ACTIVITY °· You may resume your regular activity but move at a slower pace for the next 24 hours.  °· Take frequent rest periods for the next 24 hours.  °· Walking will help expel (get rid of) the air and reduce the bloated feeling in your abdomen.  °· No driving for 24 hours (because of the anesthesia (medicine) used during the test).  °· You may shower.  °· Do not sign any important   legal documents or operate any machinery for 24 hours (because of the anesthesia used during the test).  NUTRITION  Drink plenty of fluids.   You may resume your normal diet.   Begin with a light meal and progress to your normal diet.   Avoid alcoholic beverages for 24 hours or as instructed by your caregiver.  MEDICATIONS  You may resume your normal medications unless your caregiver tells you otherwise.  WHAT YOU CAN EXPECT TODAY  You may experience abdominal discomfort such as a feeling of fullness  or gas pains.  FOLLOW-UP  Your doctor will discuss the results of your test with you.  SEEK IMMEDIATE MEDICAL ATTENTION IF ANY OF THE FOLLOWING OCCUR:  Excessive nausea (feeling sick to your stomach) and/or vomiting.   Severe abdominal pain and distention (swelling).   Trouble swallowing.   Temperature over 101 F (37.8 C).   Rectal bleeding or vomiting of blood.     Colonic diverticulosis information provided  Further recommendations to follow pending review of pathology report  Office visit with us in 3 months

## 2016-02-13 NOTE — Op Note (Signed)
[facility] Patient Name: Kathy Coleman Procedure Date: 02/13/2016 7:11 AM MRN: [id number] Date of Birth: 17-Jul-1955 Attending MD: Gennette Pac , MD CSN: [id number] Age: 60 Admit Type: Outpatient Procedure:                Upper GI endoscopy with Elease Hashimoto dilation and                            biopsy; family history EOE Indications:              Dysphagia Providers:                Gennette Pac, MD, Cynda Acres, RN, Burke Keels, Technician Referring MD:              Medicines:                Midazolam 6 mg IV, Meperidine 125 mg IV,                            Ondansetron 4 mg IV Complications:            No immediate complications. Estimated Blood Loss:     Estimated blood loss was minimal. Procedure:                Pre-Anesthesia Assessment:                           - Prior to the procedure, a History and Physical                            was performed, and patient medications and                            allergies were reviewed. The patient's tolerance of                            previous anesthesia was also reviewed. The risks                            and benefits of the procedure and the sedation                            options and risks were discussed with the patient.                            All questions were answered, and informed consent                            was obtained. Prior Anticoagulants: The patient has                            taken no previous anticoagulant or antiplatelet  agents. ASA Grade Assessment: II - A patient with                            mild systemic disease. After reviewing the risks                            and benefits, the patient was deemed in                            satisfactory condition to undergo the procedure.                           After obtaining informed consent, the endoscope was                            passed under direct vision.  Throughout the                            procedure, the patient's blood pressure, pulse, and                            oxygen saturations were monitored continuously. The                            EG-[id number] ([id number]) scope was introduced through the                            mouth, and advanced to the second part of duodenum.                            The patient tolerated the procedure well. The                            patient tolerated the procedure well. The upper GI                            endoscopy was accomplished without difficulty. Scope In: 7:51:53 AM Scope Out: 8:00:58 AM Total Procedure Duration: 0 hours 9 minutes 5 seconds  Findings:      The examined esophagus was normal.      Diffuse moderately erythematous mucosa was found in the stomach. patchy       erosions present. o ulcerative infiltrating process seen. Patent pylorus.      The duodenal bulb and second portion of the duodenum were normal. The       scope was withdrawn. Dilation was performed with a Maloney dilator with       no resistance at 54 Fr. The dilation site was examined following       endoscope reinsertion and showed no change. Estimated blood loss: none.       The abnormal appearing gastric mucosa was biopsied with a cold forceps       for histology. Estimated blood loss was minimal Impression:               - Normal esophagus. Dilated and biopsied.                           -  Erythematous mucosa in the stomach. Biopsied.                           - Normal duodenal bulb and second portion of the                            duodenum. Moderate Sedation:      Moderate (conscious) sedation was personally administered by an       anesthesia professional. The following parameters were monitored: oxygen       saturation, heart rate, blood pressure, respiratory rate, EKG, adequacy       of pulmonary ventilation, and response to care. Total physician       intraservice time was 26 minutes. Recommendation:            - Return to GI office in 3 months.                           - Patient has a contact number available for                            emergencies. The signs and symptoms of potential                            delayed complications were discussed with the                            patient. Return to normal activities tomorrow.                            Written discharge instructions were provided to the                            patient.                           - Advance diet as tolerated.                           - Continue present medications.                           - No repeat upper endoscopy.                           - Return to GI office in 3 months.                           - Await pathology results. see colonoscopy report Procedure Code(s):        --- Professional ---                           [id number], Esophagogastroduodenoscopy, flexible,                            transoral; with biopsy, single or multiple  43450, Dilation of esophagus, by unguided sound or                            bougie, single or multiple passes Diagnosis Code(s):        --- Professional ---                           K31.89, Other diseases of stomach and duodenum                           R13.10, Dysphagia, unspecified CPT copyright 2016 [employer] Association. All rights reserved. The codes documented in this report are preliminary and upon coder review may  be revised to meet current compliance requirements. Gerrit Friends. , MD Gennette Pac, MD 02/13/2016 8:28:48 AM This report has been signed electronically. Number of Addenda: 0

## 2016-02-13 NOTE — Interval H&P Note (Signed)
History and Physical Interval Note:  02/13/2016 7:31 AM  Kathy Coleman  has presented today for surgery, with the diagnosis of DYSPHAGIA/SCREENING  The various methods of treatment have been discussed with the patient and family. After consideration of risks, benefits and other options for treatment, the patient has consented to  Procedure(s) with comments: COLONOSCOPY (N/A) - 730 ESOPHAGOGASTRODUODENOSCOPY (EGD) (N/A) MALONEY DILATION (N/A) as a surgical intervention .  The patient's history has been reviewed, patient examined, no change in status, stable for surgery.  I have reviewed the patient's chart and labs.  Questions were answered to the patient's satisfaction.          No change. EGD/EGD and colonoscopy per plan.  The risks, benefits, limitations, imponderables and alternatives regarding both EGD and colonoscopy have been reviewed with the patient. Questions have been answered. All parties agreeable.

## 2016-02-13 NOTE — H&P (View-Only) (Signed)
  Primary Care Physician:  TAPPER,DAVID B, MD Primary Gastroenterologist:  Dr.   Pre-Procedure History & Physical: HPI:  Kathy Coleman is a 60 y.o. female here for consideration scheduling a screening colonoscopy. Negative colonoscopy (diverticulosis)  10 years ago. No family history of colon cancer. No bowel symptoms aside from chronic constipation managed with occasional MiraLax and stool softeners. Patient has recent recurrent esophageal dysphagia solids - states worse since recent cervical spine surgery. No significant reflux symptoms perceived recently. No alcohol or tobacco exposure. She states after her esophagus was empirically dilated 10 years ago (appeared normal) used she had a durable response to dilation at that time. Family history is significant in that her son has eosinophilic esophagitis.  Weight is down 22 pounds since 2007.   Past Medical History  Diagnosis Date  . PONV (postoperative nausea and vomiting)   . Hypertension   . Arthritis     Past Surgical History  Procedure Laterality Date  . Foot surgery      RIGHT  2014  . Cholecystectomy    . Abdominal hysterectomy    . Back surgery      1998  , 2010  LOW BACK    . Neck surgery      2005, 2010  . Total hip arthroplasty Right 05/13/2015    Procedure: TOTAL HIP ARTHROPLASTY ANTERIOR APPROACH;  Surgeon: Frank Rowan, MD;  Location: [facility] OR;  Service: Orthopedics;  Laterality: Right;  . Total knee arthroplasty Right 06/24/2015    Procedure: TOTAL KNEE ARTHROPLASTY;  Surgeon: Frank Rowan, MD;  Location: [facility] OR;  Service: Orthopedics;  Laterality: Right;  . Colonoscopy  03/24/2006    Dr.- a couple of anal papillae, o/w normal rectum with L sided diverticula, the remainder of the colonic mucosa appeared normal.   . Esophagogastroduodenoscopy  03/24/2006    Dr.- normal esophagus. a couple of antral erosions, o/w normal stomach, patent pylorus, normal D1,D2    Prior to Admission medications   Medication Sig  Start Date End Date Taking? Authorizing Provider  atorvastatin (LIPITOR) 20 MG tablet Take 20 mg by mouth daily.   Yes Historical Provider, MD  ENDOCET 10-325 MG tablet Take 1 tablet by mouth every 4 (four) hours as needed. pain 06/24/15  Yes Eric K Phillips, PA-C  gabapentin (NEURONTIN) 300 MG capsule Take 300 mg by mouth 3 (three) times daily.   Yes Historical Provider, MD  indomethacin (INDOCIN) 50 MG capsule Take 50 mg by mouth 2 (two) times daily with a meal.   Yes Historical Provider, MD  lisinopril (PRINIVIL,ZESTRIL) 20 MG tablet Take 20 mg by mouth daily.   Yes Historical Provider, MD  NON FORMULARY Stool softner   One daily   Yes Historical Provider, MD  polyethylene glycol (MIRALAX / GLYCOLAX) packet Take 17 g by mouth daily.   Yes Historical Provider, MD  traMADol (ULTRAM) 50 MG tablet Take 100 mg by mouth every 8 (eight) hours as needed for moderate pain.    Yes Historical Provider, MD  aspirin EC 325 MG tablet Take 1 tablet (325 mg total) by mouth 2 (two) times daily. Patient not taking: Reported on 01/21/2016 06/24/15   Eric K Phillips, PA-C  methocarbamol (ROBAXIN) 500 MG tablet Take 1 tablet (500 mg total) by mouth 2 (two) times daily with a meal. Patient not taking: Reported on 01/21/2016 06/24/15   Eric K Phillips, PA-C    Allergies as of 01/21/2016  . (No Known Allergies)    No family history on   file.  Social History   Social History  . Marital Status: Married    Spouse Name: N/A  . Number of Children: N/A  . Years of Education: N/A   Occupational History  . Not on file.   Social History Main Topics  . Smoking status: Former Games developermoker  . Smokeless tobacco: Not on file     Comment: Quit x 30 years  . Alcohol Use: No  . Drug Use: No  . Sexual Activity: Not on file   Other Topics Concern  . Not on file   Social History Narrative    Review of Systems: See HPI, otherwise negative ROS  Physical Exam: BP 140/75 mmHg  Pulse 71  Temp(Src) 98 F (36.7 C)  (Oral)  Ht 5\' 5"  (1.651 m)  Wt 164 lb 12.8 oz (74.753 kg)  BMI 27.42 kg/m2 General:   Alert,  Well-developed, well-nourished, pleasant and cooperative in NAD Skin:  Intact without significant lesions or rashes. Eyes:  Sclera clear, no icterus.   Conjunctiva pink. Ears:  Normal auditory acuity. Nose:  No deformity, discharge,  or lesions. Mouth:  No deformity or lesions. Neck:  Supple; no masses or thyromegaly. No significant cervical adenopathy. Lungs:  Clear throughout to auscultation.   No wheezes, crackles, or rhonchi. No acute distress. Heart:  Regular rate and rhythm; no murmurs, clicks, rubs,  or gallops. Abdomen: Non-distended, normal bowel sounds.  Soft and nontender without appreciable mass or hepatosplenomegaly.  Pulses:  Normal pulses noted. Extremities:  Without clubbing or edema. Rectal:  Deferred to time of colonoscopy   Impression:   Pleasant 60 year old lady with insidiously recurrent esophageal dysphagia. She has had a durable response to empiric dilation of a normal-appearing esophagus 10 years ago. Minimal, if any reflux, symptoms over the years. No other alarm symptoms. . Recent cervical spine surgery may or may not have anything to do with her perceived dysphagia symptoms. Family history positive for eosinophilic esophagitis.  As a separate issue, she is due for average risk colorectal cancer screening.  Recommendations:   I have offered the patient an EGD with esophageal dilation as feasible/appropriate as well as an average risk screening colonoscopy in the near future.The risks, benefits, limitations, imponderables and alternatives regarding both EGD and colonoscopy have been reviewed with the patient. Questions have been answered. All parties agreeable. .     Notice: This dictation was prepared with Dragon dictation along with smaller phrase technology. Any transcriptional errors that result from this process are unintentional and may not be corrected upon review.

## 2016-02-13 NOTE — Op Note (Signed)
[facility] Patient Name: Kathy Coleman Procedure Date: 02/13/2016 8:01 AM MRN: [id number] Date of Birth: 10-13-1955 Attending MD: Gennette Pac , MD CSN: [id number] Age: 60 Admit Type: Outpatient Procedure:                Colonoscopy -Average risk screening examination Indications:              Screening for colorectal malignant neoplasm Providers:                Gennette Pac, MD, Cynda Acres, RN, Burke Keels, Technician Referring MD:              Medicines:                Midazolam 7 mg IV, Meperidine 125 mg IV,                            Ondansetron 4 mg IV Complications:            No immediate complications. Estimated Blood Loss:     Estimated blood loss: none. Procedure:                Pre-Anesthesia Assessment:                           - Prior to the procedure, a History and Physical                            was performed, and patient medications and                            allergies were reviewed. The patient's tolerance of                            previous anesthesia was also reviewed. The risks                            and benefits of the procedure and the sedation                            options and risks were discussed with the patient.                            All questions were answered, and informed consent                            was obtained. Prior Anticoagulants: The patient has                            taken no previous anticoagulant or antiplatelet                            agents. ASA Grade Assessment: II - A patient with  mild systemic disease. After reviewing the risks                            and benefits, the patient was deemed in                            satisfactory condition to undergo the procedure.                           - Prior to the procedure, a History and Physical                            was performed, and patient medications and            allergies were reviewed. The patient's tolerance of                            previous anesthesia was also reviewed. The risks                            and benefits of the procedure and the sedation                            options and risks were discussed with the patient.                            All questions were answered, and informed consent                            was obtained. Prior Anticoagulants: The patient has                            taken no previous anticoagulant or antiplatelet                            agents. ASA Grade Assessment: II - A patient with                            mild systemic disease. After reviewing the risks                            and benefits, the patient was deemed in                            satisfactory condition to undergo the procedure.                           After obtaining informed consent, the colonoscope                            was passed under direct vision. Throughout the                            procedure, the patient's blood pressure,  pulse, and                            oxygen saturations were monitored continuously. The                            EC-[id number] ([id number]) scope was introduced through                            the anus and advanced to the the cecum, identified                            by appendiceal orifice and ileocecal valve. The                            ileocecal valve, appendiceal orifice, and rectum                            were photographed. The colonoscopy was performed                            without difficulty. The patient tolerated the                            procedure well. The quality of the bowel                            preparation was adequate. The quality of the bowel                            preparation was adequate. The quality of the bowel                            preparation was adequate. Scope In: 8:09:03 AM Scope Out: 8:23:38 AM Scope Withdrawal Time: 0  hours 8 minutes 17 seconds  Total Procedure Duration: 0 hours 14 minutes 35 seconds  Findings:      The perianal and digital rectal examinations were normal.      A few small-mouthed diverticula were found in the sigmoid colon.       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       Estimated blood loss: none.      The exam was otherwise without abnormality on direct and retroflexion       views. Impression:               - Diverticulosis in the sigmoid colon. Melanosis                            coli                           - The examination was otherwise normal on direct                            and retroflexion views.                           -  No specimens collected. Moderate Sedation:      Moderate (conscious) sedation was administered by the endoscopy nurse       and supervised by the endoscopist. The following parameters were       monitored: oxygen saturation, heart rate, blood pressure, respiratory       rate, EKG, adequacy of pulmonary ventilation, and response to care.       Total physician intraservice time was 26 minutes. Recommendation:           - Patient has a contact number available for                            emergencies. The signs and symptoms of potential                            delayed complications were discussed with the                            patient. Return to normal activities tomorrow.                            Written discharge instructions were provided to the                            patient.                           - Advance diet as tolerated.                           - Continue present medications.                           - Repeat colonoscopy in 10 years for screening                            purposes.                           - Return to GI clinic in 3 months. See EGD report Procedure Code(s):        --- Professional ---                           [id number], Colonoscopy, flexible; diagnostic, including                             collection of specimen(s) by brushing or washing,                            when performed (separate procedure)                           99152, Moderate sedation services provided by the                            same physician or other qualified health care  professional performing the diagnostic or                            therapeutic service that the sedation supports,                            requiring the presence of an independent trained                            observer to assist in the monitoring of the                            patient's level of consciousness and physiological                            status; initial 15 minutes of intraservice time,                            patient age 54 years or older                           [phone], Moderate sedation services; each additional                            15 minutes intraservice time Diagnosis Code(s):        --- Professional ---                           Z12.11, Encounter for screening for malignant                            neoplasm of colon                           K57.30, Diverticulosis of large intestine without                            perforation or abscess without bleeding CPT copyright 2016 American Medical Association. All rights reserved. The codes documented in this report are preliminary and upon coder review may  be revised to meet current compliance requirements. Gerrit Friends. , MD Gennette Pac, MD 02/13/2016 8:35:57 AM This report has been signed electronically. Number of Addenda: 0

## 2016-02-19 ENCOUNTER — Encounter: Payer: Self-pay | Admitting: Internal Medicine

## 2016-02-19 ENCOUNTER — Encounter (HOSPITAL_COMMUNITY): Payer: Self-pay | Admitting: Internal Medicine

## 2016-05-15 ENCOUNTER — Ambulatory Visit (INDEPENDENT_AMBULATORY_CARE_PROVIDER_SITE_OTHER): Payer: Medicare FFS | Admitting: Gastroenterology

## 2016-05-15 ENCOUNTER — Encounter: Payer: Self-pay | Admitting: Gastroenterology

## 2016-05-15 DIAGNOSIS — R1310 Dysphagia, unspecified: Secondary | ICD-10-CM

## 2016-05-15 NOTE — Patient Instructions (Signed)
We will see you in 1 year.   Call us if any worsening of swallowing.   Call us if any changes in bowel habits, constipation, etc.

## 2016-05-15 NOTE — Progress Notes (Signed)
Referring Provider: Louie Bostonapper, David B., MD Primary Care Physician:  Louie BostonAPPER,DAVID B, MD Primary GI: Dr. Jena Gaussourk   Chief Complaint  Patient presents with  . Follow-up    had EGD/TCS doing ok    HPI:   Kathy Coleman is a 60 y.o. female presenting today with a history of dysphagia, recently undergoing EGD with empiric dilation. Colonoscopy completed as well and due again in 10 years  Dysphagia improved. Has to be careful with eating meats, take smaller bites, chew food well. Has had to slow down. No odynophagia. No unexplained weight loss or lack of appetite. Mild constipation, takes Miralax once a day. If drinks more water, she has more improvement. Would like to stay with this. Ranitidine once a day.   Past Medical History:  Diagnosis Date  . Arthritis   . GERD (gastroesophageal reflux disease)   . Hypertension   . PONV (postoperative nausea and vomiting)     Past Surgical History:  Procedure Laterality Date  . ABDOMINAL HYSTERECTOMY    . BACK SURGERY     1998  , 2010  LOW BACK    . BIOPSY  02/13/2016   Procedure: BIOPSY;  Surgeon: Corbin Adeobert M Rourk, MD;  Location: [facility];  Service: Endoscopy;;  Gastric and Esophageal biopsies  . CHOLECYSTECTOMY    . COLONOSCOPY  03/24/2006   Dr.Rourk- a couple of anal papillae, o/w normal rectum with L sided diverticula, the remainder of the colonic mucosa appeared normal.   . COLONOSCOPY N/A 02/13/2016   Dr. Jena Gaussourk: diverticulosis, melanosis coli   . ESOPHAGOGASTRODUODENOSCOPY  03/24/2006   Dr.Rourk- normal esophagus. a couple of antral erosions, o/w normal stomach, patent pylorus, normal D1,D2  . ESOPHAGOGASTRODUODENOSCOPY N/A 02/13/2016   Dr. Jena Gaussourk: erythematous mucosa, normal D2, empiric dilation   . FOOT SURGERY     RIGHT  2014  . MALONEY DILATION N/A 02/13/2016   Procedure: Elease HashimotoMALONEY DILATION;  Surgeon: Corbin Adeobert M Rourk, MD;  Location: [facility];  Service: Endoscopy;  Laterality: N/A;  . NECK SURGERY     2005, 2010  . TOTAL HIP  ARTHROPLASTY Right 05/13/2015   Procedure: TOTAL HIP ARTHROPLASTY ANTERIOR APPROACH;  Surgeon: Gean BirchwoodFrank Rowan, MD;  Location: [facility] OR;  Service: Orthopedics;  Laterality: Right;  . TOTAL KNEE ARTHROPLASTY Right 06/24/2015   Procedure: TOTAL KNEE ARTHROPLASTY;  Surgeon: Gean BirchwoodFrank Rowan, MD;  Location: [facility] OR;  Service: Orthopedics;  Laterality: Right;    Current Outpatient Prescriptions  Medication Sig Dispense Refill  . atorvastatin (LIPITOR) 20 MG tablet Take 20 mg by mouth daily.    . ENDOCET 10-325 MG tablet Take 1 tablet by mouth every 4 (four) hours as needed. pain 60 tablet 0  . gabapentin (NEURONTIN) 300 MG capsule Take 300 mg by mouth 3 (three) times daily.    . indomethacin (INDOCIN) 50 MG capsule Take 50 mg by mouth 2 (two) times daily with a meal.    . lisinopril (PRINIVIL,ZESTRIL) 20 MG tablet Take 20 mg by mouth daily.    . NON FORMULARY Stool softner   One daily    . polyethylene glycol (MIRALAX / GLYCOLAX) packet Take 17 g by mouth daily.    . traMADol (ULTRAM) 50 MG tablet Take 100 mg by mouth every 8 (eight) hours as needed for moderate pain.      No current facility-administered medications for this visit.     Allergies as of 05/15/2016  . (No Known Allergies)      Social History  Social History  . Marital status: Married    Spouse name: N/A  . Number of children: N/A  . Years of education: N/A   Social History Main Topics  . Smoking status: Former Games developermoker  . Smokeless tobacco: Never Used     Comment: Quit x 30 years  . Alcohol use No  . Drug use: No  . Sexual activity: Not Asked   Other Topics Concern  . None   Social History Narrative  . None    Review of Systems: As mentioned in HPI   Physical Exam: BP 120/75   Pulse 75   Temp 98 F (36.7 C) (Oral)   Ht 5\' 5"  (1.651 m)   Wt 159 lb 9.6 oz (72.4 kg)   BMI 26.56 kg/m  General:   Alert and oriented. No distress noted. Pleasant and cooperative.  Head:  Normocephalic and atraumatic. Eyes:  Conjuctiva  clear without scleral icterus. Mouth:  Oral mucosa pink and moist.  Abdomen:  +BS, soft, non-tender and non-distended. No rebound or guarding. No HSM or masses noted. Msk:  Symmetrical without gross deformities. Normal posture. Extremities:  Without edema. Neurologic:  Alert and  oriented x4;  grossly normal neurologically. Psych:  Alert and cooperative. Normal mood and affect.

## 2016-05-17 ENCOUNTER — Encounter: Payer: Self-pay | Admitting: Gastroenterology

## 2016-05-17 NOTE — Assessment & Plan Note (Signed)
60 year old female with improvement in dysphagia s/p empiric dilation. Doing well without concerning GI symptoms. Chronic, mild constipation managed with Miralax, and she would like to continue this regimen. As she is doing well, we will see her back in 1 year.

## 2016-05-19 NOTE — Progress Notes (Signed)
cc'ed to pcp °

## 2016-06-01 ENCOUNTER — Other Ambulatory Visit: Payer: Self-pay | Admitting: Orthopedic Surgery

## 2016-06-05 ENCOUNTER — Encounter (HOSPITAL_COMMUNITY): Payer: Self-pay

## 2016-06-05 ENCOUNTER — Encounter (HOSPITAL_COMMUNITY)
Admission: RE | Admit: 2016-06-05 | Discharge: 2016-06-05 | Disposition: A | Discharge status: Home / Self Care | Payer: Medicare FFS | Source: Ambulatory Visit | Attending: Orthopedic Surgery | Admitting: Orthopedic Surgery

## 2016-06-05 DIAGNOSIS — I10 Essential (primary) hypertension: Secondary | ICD-10-CM | POA: Insufficient documentation

## 2016-06-05 DIAGNOSIS — M1990 Unspecified osteoarthritis, unspecified site: Secondary | ICD-10-CM | POA: Insufficient documentation

## 2016-06-05 DIAGNOSIS — K219 Gastro-esophageal reflux disease without esophagitis: Secondary | ICD-10-CM | POA: Diagnosis not present

## 2016-06-05 DIAGNOSIS — Z90710 Acquired absence of both cervix and uterus: Secondary | ICD-10-CM | POA: Diagnosis not present

## 2016-06-05 DIAGNOSIS — Z01810 Encounter for preprocedural cardiovascular examination: Secondary | ICD-10-CM | POA: Insufficient documentation

## 2016-06-05 DIAGNOSIS — Z87891 Personal history of nicotine dependence: Secondary | ICD-10-CM | POA: Diagnosis not present

## 2016-06-05 DIAGNOSIS — Z9049 Acquired absence of other specified parts of digestive tract: Secondary | ICD-10-CM | POA: Insufficient documentation

## 2016-06-05 DIAGNOSIS — Z01812 Encounter for preprocedural laboratory examination: Secondary | ICD-10-CM | POA: Diagnosis present

## 2016-06-05 DIAGNOSIS — Z01818 Encounter for other preprocedural examination: Secondary | ICD-10-CM | POA: Diagnosis not present

## 2016-06-05 DIAGNOSIS — Z87442 Personal history of urinary calculi: Secondary | ICD-10-CM | POA: Diagnosis not present

## 2016-06-05 HISTORY — DX: Personal history of urinary calculi: Z87.442

## 2016-06-05 HISTORY — DX: Personal history of other medical treatment: Z92.89

## 2016-06-05 HISTORY — DX: Unspecified osteoarthritis, unspecified site: M19.90

## 2016-06-05 LAB — CBC WITH DIFFERENTIAL/PLATELET
BASOS ABS: 0 10*3/uL (ref 0.0–0.1)
Basophils Relative: 0 %
EOS ABS: 0.1 10*3/uL (ref 0.0–0.7)
EOS PCT: 1 %
HCT: 34.3 % — ABNORMAL LOW (ref 36.0–46.0)
Hemoglobin: 11.3 g/dL — ABNORMAL LOW (ref 12.0–15.0)
LYMPHS PCT: 21 %
Lymphs Abs: 0.9 10*3/uL (ref 0.7–4.0)
MCH: 29.8 pg (ref 26.0–34.0)
MCHC: 32.9 g/dL (ref 30.0–36.0)
MCV: 90.5 fL (ref 78.0–100.0)
MONO ABS: 0.4 10*3/uL (ref 0.1–1.0)
Monocytes Relative: 9 %
Neutro Abs: 2.9 10*3/uL (ref 1.7–7.7)
Neutrophils Relative %: 69 %
PLATELETS: 241 10*3/uL (ref 150–400)
RBC: 3.79 MIL/uL — AB (ref 3.87–5.11)
RDW: 13.2 % (ref 11.5–15.5)
WBC: 4.2 10*3/uL (ref 4.0–10.5)

## 2016-06-05 LAB — URINALYSIS, ROUTINE W REFLEX MICROSCOPIC
Bilirubin Urine: NEGATIVE
Glucose, UA: NEGATIVE mg/dL
Hgb urine dipstick: NEGATIVE
Ketones, ur: NEGATIVE mg/dL
NITRITE: NEGATIVE
PH: 6.5 (ref 5.0–8.0)
Protein, ur: NEGATIVE mg/dL
SPECIFIC GRAVITY, URINE: 1.007 (ref 1.005–1.030)

## 2016-06-05 LAB — BASIC METABOLIC PANEL
Anion gap: 9 (ref 5–15)
BUN: 11 mg/dL (ref 6–20)
CO2: 29 mmol/L (ref 22–32)
CREATININE: 1.13 mg/dL — AB (ref 0.44–1.00)
Calcium: 9.6 mg/dL (ref 8.9–10.3)
Chloride: 98 mmol/L — ABNORMAL LOW (ref 101–111)
GFR calc Af Amer: 60 mL/min — ABNORMAL LOW (ref >60)
GFR, EST NON AFRICAN AMERICAN: 51 mL/min — AB (ref >60)
GLUCOSE: 86 mg/dL (ref 65–99)
POTASSIUM: 4.5 mmol/L (ref 3.5–5.1)
SODIUM: 136 mmol/L (ref 135–145)

## 2016-06-05 LAB — PROTIME-INR
INR: 1.04
PROTHROMBIN TIME: 13.6 s (ref 11.4–15.2)

## 2016-06-05 LAB — URINE MICROSCOPIC-ADD ON: RBC / HPF: NONE SEEN RBC/hpf (ref 0–5)

## 2016-06-05 LAB — APTT: aPTT: 31 seconds (ref 24–36)

## 2016-06-05 LAB — SURGICAL PCR SCREEN
MRSA, PCR: NEGATIVE
Staphylococcus aureus: NEGATIVE

## 2016-06-05 NOTE — Pre-Procedure Instructions (Signed)
Kathy MediciMyra L Coleman  06/05/2016      [facility] [place], [place] - [id number] [place] [id number] Phone: [phone] Fax: [phone]  [facility] - [place], [place] - [address] [place] [id number] Phone: [phone] Fax: [phone]    Your procedure is scheduled on Monday, June 15, 2016  [facility] Ad[facility] at 5:30 A.M.  Call this number if you have problems the morning of surgery:  [phone]   Remember:  Do not eat food or drink liquids after midnight Sunday, June 14, 2016  Take these medicines the morning of surgery with A SIP OF WATER :gabapentin (NEURONTIN), if needed: pain medication Stop taking Aspirin, vitamins, fish oil and herbal medications. Do not take any NSAIDs ie: Ibuprofen, Advil, Naproxen, BC and Goody Powder or any medication containing Aspirin such as indomethacin (INDOCIN); stop Monday, June 08, 2016  Do not wear jewelry, make-up or nail polish.  Do not wear lotions, powders, or perfumes, or deoderant.  Do not shave 48 hours prior to surgery.    Do not bring valuables to the hospital.  [facility] is not responsible for any belongings or valuables.  Contacts, dentures or bridgework may not be worn into surgery.  Leave your suitcase in the car.  After surgery it may be brought to your room.  For patients admitted to the hospital, discharge time will be determined by your treatment team.  Special instructions:   [place] - Preparing for Surgery  Before surgery, you can play an important role.  Because skin is not sterile, your skin needs to be as free of germs as possible.  You can reduce the number of germs on you skin by washing with CHG (chlorahexidine gluconate) soap before surgery.  CHG is an antiseptic cleaner which kills germs and bonds with the skin to continue killing germs even after washing.  Please DO NOT use if  you have an allergy to CHG or antibacterial soaps.  If your skin becomes reddened/irritated stop using the CHG and inform your nurse when you arrive at Short Stay.  Do not shave (including legs and underarms) for at least 48 hours prior to the first CHG shower.  You may shave your face.  Please follow these instructions carefully:   1.  Shower with CHG Soap the night before surgery and the morning of Surgery.  2.  If you choose to wash your hair, wash your hair first as usual with your normal shampoo.  3.  After you shampoo, rinse your hair and body thoroughly to remove the Shampoo.  4.  Use CHG as you would any other liquid soap.  You can apply chg directly  to the skin and wash gently with scrungie or a clean washcloth.  5.  Apply the CHG Soap to your body ONLY FROM THE NECK DOWN.  Do not use on open wounds or open sores.  Avoid contact with your eyes, ears, mouth and genitals (private parts).  Wash genitals (private parts) with your normal soap.  6.  Wash thoroughly, paying special attention to the area where your surgery will be performed.  7.  Thoroughly rinse your body with warm water from the neck down.  8.  DO NOT shower/wash with your normal soap after using and rinsing off the CHG Soap.  9.  Pat yourself dry with a clean towel.  10.  Wear clean pajamas.            11.  Place clean sheets on your bed the night of your first shower and do not sleep with pets.  Day of Surgery  Do not apply any lotions/deoderants the morning of surgery.  Please wear clean clothes to the hospital/surgery center.  Please read over the following fact sheets that you were given. Pain Booklet, Coughing and Deep Breathing, Blood Transfusion Information, MRSA Information and Surgical Site Infection Prevention

## 2016-06-05 NOTE — Progress Notes (Signed)
Pt denies SOB, chest pain, and being under the care of a cardiologist. Pt denies having a stress test, echo and cardiac cath. Pt denies having an EKG and chest x ray within the last year. Pt denies having recent labs.Spoke with Olegario MessierKathy, CMA, to make MD aware of abnormal UA. Pt chart forwarded to anesthesia to review EKG.

## 2016-06-08 NOTE — Progress Notes (Signed)
Anesthesia Chart Review: Patient is a 60 year old female scheduled for left anterior THA on 06/15/16 by Dr. Turner Danielsowan.  History includes former smoker, post-operative N/V, HTN, GERD, arthritis, nephrolithiasis, dysphagia, hysterectomy, cholecystectomy, neck surgery, L2-4 PLIF 07/1814, right THA 05/13/15, right TKA 06/24/15.    PCP is listed as Dr. Wyvonnia Loraavid Tapper. [place] lives in [place], [place].  Meds include Lipitor, Endocet, lisinopril-HCTZ, Zantac, tramadol.  BP 130/74   Pulse 71   Temp 36.9 C (Oral)   Resp 18   Ht 5\' 5"  (1.651 m)   Wt 161 lb 9.6 oz (73.3 kg)   SpO2 99%   BMI 26.89 kg/m   EKG 06/05/16: SR with first degree AV block, inferior infarct (age undetermined). The interpreting cardiologist did not think there was significant change when compared to 07/13/14 tracing.  CXR 06/05/16: IMPRESSION: No active cardiopulmonary disease.  Preoperative labs noted. Cr 1.13, glucose 86. CBC, PT/PTT WNL. T&S done. UA showed moderate leukocytes, negative nitrites, many bacteria, 6-30 WBCs ([facility] Wadie Lessenowan's office).  EKG appears stable. If no acute changes then I anticipate that she can proceed as planned.  Velna Ochsllison , PA-C [facility]Anesthesiology Phone [phone] 06/08/2016 10:29 AM

## 2016-06-11 NOTE — H&P (Signed)
TOTAL HIP ADMISSION H&P  Patient is admitted for left total hip arthroplasty.  Subjective:  Chief Complaint: left hip pain  HPI: Kathy Coleman, 60 y.o. female, has a history of pain and functional disability in the left hip(s) due to arthritis and patient has failed non-surgical conservative treatments for greater than 12 weeks to include NSAID's and/or analgesics, corticosteriod injections, flexibility and strengthening excercises, use of assistive devices, weight reduction as appropriate and activity modification.  Onset of symptoms was gradual starting 2 years ago with gradually worsening course since that time.The patient noted no past surgery on the left hip(s).  Patient currently rates pain in the left hip at 10 out of 10 with activity. Patient has night pain, worsening of pain with activity and weight bearing, pain that interfers with activities of daily living and pain with passive range of motion. Patient has evidence of joint space narrowing by imaging studies. This condition presents safety issues increasing the risk of falls.  There is no current active infection.  Patient Active Problem List   Diagnosis Date Noted  . Dysphagia 05/15/2016  . Arthritis of knee 06/24/2015  . Primary osteoarthritis of right knee 06/22/2015  . Primary osteoarthritis of right hip 05/11/2015  . Postoperative anemia due to acute blood loss 07/24/2014  . Lumbar radiculopathy 07/23/2014   Past Medical History:  Diagnosis Date  . Arthritis   . Dysphagia   . GERD (gastroesophageal reflux disease)   . History of blood transfusion   . History of kidney stones   . Hypertension   . Osteoarthritis    left hip  . PONV (postoperative nausea and vomiting)   . Wears glasses     Past Surgical History:  Procedure Laterality Date  . ABDOMINAL HYSTERECTOMY    . BACK SURGERY     1998  , 2010  LOW BACK    . BIOPSY  02/13/2016   Procedure: BIOPSY;  Surgeon: Corbin Adeobert M Rourk, MD;  Location: [facility];   Service: Endoscopy;;  Gastric and Esophageal biopsies  . CHOLECYSTECTOMY    . COLONOSCOPY  03/24/2006   Dr.Rourk- a couple of anal papillae, o/w normal rectum with L sided diverticula, the remainder of the colonic mucosa appeared normal.   . COLONOSCOPY N/A 02/13/2016   Dr. Jena Gaussourk: diverticulosis, melanosis coli   . ESOPHAGOGASTRODUODENOSCOPY  03/24/2006   Dr.Rourk- normal esophagus. a couple of antral erosions, o/w normal stomach, patent pylorus, normal D1,D2  . ESOPHAGOGASTRODUODENOSCOPY N/A 02/13/2016   Dr. Jena Gaussourk: erythematous mucosa, normal D2, empiric dilation   . FOOT SURGERY     RIGHT  2014  . MALONEY DILATION N/A 02/13/2016   Procedure: Elease HashimotoMALONEY DILATION;  Surgeon: Corbin Adeobert M Rourk, MD;  Location: [facility];  Service: Endoscopy;  Laterality: N/A;  . NECK SURGERY     2005, 2010  . TOTAL HIP ARTHROPLASTY Right 05/13/2015   Procedure: TOTAL HIP ARTHROPLASTY ANTERIOR APPROACH;  Surgeon: Gean BirchwoodFrank Rowan, MD;  Location: [facility];  Service: Orthopedics;  Laterality: Right;  . TOTAL KNEE ARTHROPLASTY Right 06/24/2015   Procedure: TOTAL KNEE ARTHROPLASTY;  Surgeon: Gean BirchwoodFrank Rowan, MD;  Location: [facility];  Service: Orthopedics;  Laterality: Right;    No prescriptions prior to admission.   No Known Allergies  Social History  Substance Use Topics  . Smoking status: Former Games developermoker  . Smokeless tobacco: Never Used     Comment: Quit x 30 years  . Alcohol use No    Family History  Problem Relation Age of Onset  .  Hypertension Mother   . COPD Mother   . Diabetes Mother   . Heart disease Father   . Diabetes Father   . Hypertension Father      Review of Systems  Constitutional: Negative.   HENT: Negative.   Eyes: Negative.   Respiratory: Negative.   Cardiovascular:       HTN  Gastrointestinal: Positive for constipation.  Genitourinary: Negative.        Kidney stones  Musculoskeletal: Positive for joint pain.  Skin: Negative.   Neurological: Negative.   Endo/Heme/Allergies: Negative.    Psychiatric/Behavioral: Negative.     Objective:  Physical Exam  Constitutional: She is oriented to person, place, and time. She appears well-developed and well-nourished.  HENT:  Head: Normocephalic and atraumatic.  Eyes: Pupils are equal, round, and reactive to light.  Neck: Normal range of motion. Neck supple.  Cardiovascular: Intact distal pulses.   Respiratory: Effort normal.  Musculoskeletal: She exhibits tenderness.  she does have good strength good range of motion in the right hip and right knee.  No pain with range of motion exercises.  In the left hip she does have irritability with internal and external rotation.  She is able to internally rotate to approximately 15.  She has pain with he'll bump.  Her calves are soft and nontender bilaterally.  Neurological: She is alert and oriented to person, place, and time.  Skin: Skin is warm and dry.  Psychiatric: She has a normal mood and affect. Her behavior is normal. Judgment and thought content normal.    Vital signs in last 24 hours:    Labs:   Estimated body mass index is 26.89 kg/m as calculated from the following:   Height as of 06/05/16: 5\' 5"  (1.651 m).   Weight as of 06/05/16: 73.3 kg (161 lb 9.6 oz).   Imaging Review Plain radiographs demonstrate AP of the pelvis and crosstable lateral of left hip are taken and reviewed in office today.  This shows a well-positioned well fixed right total hip with a Tri-Lock stem.  Patient's left hip does show bone-on-bone arthritis.  Assessment/Plan:  End stage arthritis, left hip(s)  The patient history, physical examination, clinical judgement of the provider and imaging studies are consistent with end stage degenerative joint disease of the left hip(s) and total hip arthroplasty is deemed medically necessary. The treatment options including medical management, injection therapy, arthroscopy and arthroplasty were discussed at length. The risks and benefits of total hip  arthroplasty were presented and reviewed. The risks due to aseptic loosening, infection, stiffness, dislocation/subluxation,  thromboembolic complications and other imponderables were discussed.  The patient acknowledged the explanation, agreed to proceed with the plan and consent was signed. Patient is being admitted for inpatient treatment for surgery, pain control, PT, OT, prophylactic antibiotics, VTE prophylaxis, progressive ambulation and ADL's and discharge planning.The patient is planning to be discharged home with home health services

## 2016-06-12 DIAGNOSIS — M1612 Unilateral primary osteoarthritis, left hip: Secondary | ICD-10-CM | POA: Diagnosis present

## 2016-06-14 MED ORDER — TRANEXAMIC ACID 1000 MG/10ML IV SOLN
2000.0000 mg | Freq: Once | INTRAVENOUS | Status: AC
  Administered 2016-06-15: 2000 mg via TOPICAL
  Filled 2016-06-14 (×2): qty 20

## 2016-06-14 MED ORDER — BUPIVACAINE LIPOSOME 1.3 % IJ SUSP
20.0000 mL | Freq: Once | INTRAMUSCULAR | Status: AC
  Administered 2016-06-15: 20 mL
  Filled 2016-06-14 (×2): qty 20

## 2016-06-14 MED ORDER — TRANEXAMIC ACID 1000 MG/10ML IV SOLN
1000.0000 mg | INTRAVENOUS | Status: AC
  Administered 2016-06-15: 1000 mg via INTRAVENOUS
  Filled 2016-06-14 (×2): qty 10

## 2016-06-15 ENCOUNTER — Inpatient Hospital Stay (HOSPITAL_COMMUNITY)
Admission: RE | Admit: 2016-06-15 | Discharge: 2016-06-17 | DRG: 470 | Disposition: A | Discharge status: Home Health | Payer: Medicare FFS | Source: Ambulatory Visit | Attending: Orthopedic Surgery | Admitting: Orthopedic Surgery

## 2016-06-15 ENCOUNTER — Inpatient Hospital Stay (HOSPITAL_COMMUNITY): Payer: Medicare FFS

## 2016-06-15 ENCOUNTER — Encounter (HOSPITAL_COMMUNITY): Payer: Self-pay | Admitting: *Deleted

## 2016-06-15 ENCOUNTER — Inpatient Hospital Stay (HOSPITAL_COMMUNITY): Payer: Medicare FFS | Admitting: Vascular Surgery

## 2016-06-15 ENCOUNTER — Encounter (HOSPITAL_COMMUNITY)
Admission: RE | Disposition: A | Discharge status: Home Health | Payer: Self-pay | Source: Ambulatory Visit | Attending: Orthopedic Surgery

## 2016-06-15 ENCOUNTER — Inpatient Hospital Stay (HOSPITAL_COMMUNITY): Payer: Medicare FFS | Admitting: Certified Registered Nurse Anesthetist

## 2016-06-15 DIAGNOSIS — Z419 Encounter for procedure for purposes other than remedying health state, unspecified: Secondary | ICD-10-CM

## 2016-06-15 DIAGNOSIS — I10 Essential (primary) hypertension: Secondary | ICD-10-CM | POA: Diagnosis present

## 2016-06-15 DIAGNOSIS — Z87891 Personal history of nicotine dependence: Secondary | ICD-10-CM | POA: Diagnosis not present

## 2016-06-15 DIAGNOSIS — D62 Acute posthemorrhagic anemia: Secondary | ICD-10-CM | POA: Diagnosis not present

## 2016-06-15 DIAGNOSIS — M25552 Pain in left hip: Secondary | ICD-10-CM | POA: Diagnosis present

## 2016-06-15 DIAGNOSIS — K5900 Constipation, unspecified: Secondary | ICD-10-CM | POA: Diagnosis not present

## 2016-06-15 DIAGNOSIS — K219 Gastro-esophageal reflux disease without esophagitis: Secondary | ICD-10-CM | POA: Diagnosis present

## 2016-06-15 DIAGNOSIS — M1612 Unilateral primary osteoarthritis, left hip: Secondary | ICD-10-CM | POA: Diagnosis present

## 2016-06-15 DIAGNOSIS — Z96651 Presence of right artificial knee joint: Secondary | ICD-10-CM | POA: Diagnosis present

## 2016-06-15 DIAGNOSIS — Z96641 Presence of right artificial hip joint: Secondary | ICD-10-CM | POA: Diagnosis present

## 2016-06-15 HISTORY — PX: TOTAL HIP ARTHROPLASTY: SHX124

## 2016-06-15 SURGERY — ARTHROPLASTY, HIP, TOTAL, ANTERIOR APPROACH
Anesthesia: General | Site: Hip | Laterality: Left

## 2016-06-15 MED ORDER — ROCURONIUM BROMIDE 100 MG/10ML IV SOLN
INTRAVENOUS | Status: DC | PRN
  Administered 2016-06-15: 50 mg via INTRAVENOUS

## 2016-06-15 MED ORDER — GABAPENTIN 300 MG PO CAPS
300.0000 mg | ORAL_CAPSULE | Freq: Three times a day (TID) | ORAL | Status: DC
  Administered 2016-06-15 – 2016-06-17 (×5): 300 mg via ORAL
  Filled 2016-06-15 (×5): qty 1

## 2016-06-15 MED ORDER — HYDROMORPHONE HCL 1 MG/ML IJ SOLN
INTRAMUSCULAR | Status: AC
  Filled 2016-06-15: qty 1

## 2016-06-15 MED ORDER — MEPERIDINE HCL 25 MG/ML IJ SOLN: 6.2500 mg | INTRAMUSCULAR | Status: DC | PRN

## 2016-06-15 MED ORDER — PROPOFOL 10 MG/ML IV BOLUS
INTRAVENOUS | Status: DC | PRN
  Administered 2016-06-15: 130 mg via INTRAVENOUS
  Administered 2016-06-15: 10 mg via INTRAVENOUS

## 2016-06-15 MED ORDER — SODIUM CHLORIDE 0.9 % IV BOLUS (SEPSIS)
500.0000 mL | Freq: Once | INTRAVENOUS | Status: AC
  Administered 2016-06-15: 500 mL via INTRAVENOUS

## 2016-06-15 MED ORDER — FAMOTIDINE 20 MG PO TABS
20.0000 mg | ORAL_TABLET | Freq: Two times a day (BID) | ORAL | Status: DC
  Administered 2016-06-15 – 2016-06-17 (×5): 20 mg via ORAL
  Filled 2016-06-15 (×5): qty 1

## 2016-06-15 MED ORDER — DOCUSATE SODIUM 100 MG PO CAPS
100.0000 mg | ORAL_CAPSULE | Freq: Two times a day (BID) | ORAL | Status: DC
  Administered 2016-06-15 – 2016-06-17 (×5): 100 mg via ORAL
  Filled 2016-06-15 (×5): qty 1

## 2016-06-15 MED ORDER — HYDROMORPHONE HCL 1 MG/ML IJ SOLN
0.2500 mg | INTRAMUSCULAR | Status: DC | PRN
  Administered 2016-06-15 (×2): 0.5 mg via INTRAVENOUS
  Administered 2016-06-15: 1 mg via INTRAVENOUS

## 2016-06-15 MED ORDER — LISINOPRIL 20 MG PO TABS
20.0000 mg | ORAL_TABLET | Freq: Every day | ORAL | Status: DC
  Administered 2016-06-15 – 2016-06-17 (×2): 20 mg via ORAL
  Filled 2016-06-15 (×3): qty 1

## 2016-06-15 MED ORDER — SUGAMMADEX SODIUM 200 MG/2ML IV SOLN
INTRAVENOUS | Status: DC | PRN
  Administered 2016-06-15: 100 mg via INTRAVENOUS

## 2016-06-15 MED ORDER — ACETAMINOPHEN 325 MG PO TABS
650.0000 mg | ORAL_TABLET | Freq: Four times a day (QID) | ORAL | Status: DC | PRN
  Administered 2016-06-15 – 2016-06-16 (×2): 650 mg via ORAL
  Filled 2016-06-15 (×2): qty 2

## 2016-06-15 MED ORDER — TRANEXAMIC ACID 1000 MG/10ML IV SOLN
1000.0000 mg | Freq: Once | INTRAVENOUS | Status: AC
  Administered 2016-06-15: 1000 mg via INTRAVENOUS
  Filled 2016-06-15: qty 10

## 2016-06-15 MED ORDER — MIDAZOLAM HCL 5 MG/5ML IJ SOLN
INTRAMUSCULAR | Status: DC | PRN
  Administered 2016-06-15: 2 mg via INTRAVENOUS

## 2016-06-15 MED ORDER — ASPIRIN EC 325 MG PO TBEC
325.0000 mg | DELAYED_RELEASE_TABLET | Freq: Every day | ORAL | Status: DC
  Administered 2016-06-16 – 2016-06-17 (×2): 325 mg via ORAL
  Filled 2016-06-15 (×2): qty 1

## 2016-06-15 MED ORDER — HYDROMORPHONE HCL 1 MG/ML IJ SOLN
INTRAMUSCULAR | Status: DC | PRN
  Administered 2016-06-15 (×2): 0.5 mg via INTRAVENOUS

## 2016-06-15 MED ORDER — SCOPOLAMINE 1 MG/3DAYS TD PT72
MEDICATED_PATCH | TRANSDERMAL | Status: DC | PRN
  Administered 2016-06-15: 1.5 mg via TRANSDERMAL

## 2016-06-15 MED ORDER — FENTANYL CITRATE (PF) 100 MCG/2ML IJ SOLN
INTRAMUSCULAR | Status: DC | PRN
  Administered 2016-06-15: 100 ug via INTRAVENOUS
  Administered 2016-06-15: 150 ug via INTRAVENOUS
  Administered 2016-06-15: 50 ug via INTRAVENOUS
  Administered 2016-06-15: 100 ug via INTRAVENOUS

## 2016-06-15 MED ORDER — HYDROMORPHONE HCL 1 MG/ML IJ SOLN
INTRAMUSCULAR | Status: AC
  Filled 2016-06-15: qty 0.5

## 2016-06-15 MED ORDER — CHLORHEXIDINE GLUCONATE 4 % EX LIQD: 60.0000 mL | Freq: Once | CUTANEOUS | Status: DC

## 2016-06-15 MED ORDER — METOCLOPRAMIDE HCL 5 MG PO TABS: 5.0000 mg | ORAL_TABLET | Freq: Three times a day (TID) | ORAL | Status: DC | PRN

## 2016-06-15 MED ORDER — KCL IN DEXTROSE-NACL 20-5-0.45 MEQ/L-%-% IV SOLN
INTRAVENOUS | Status: DC
  Administered 2016-06-15 – 2016-06-17 (×4): via INTRAVENOUS
  Filled 2016-06-15 (×5): qty 1000

## 2016-06-15 MED ORDER — HYDROCHLOROTHIAZIDE 12.5 MG PO CAPS
12.5000 mg | ORAL_CAPSULE | Freq: Every day | ORAL | Status: DC
  Administered 2016-06-15 – 2016-06-17 (×2): 12.5 mg via ORAL
  Filled 2016-06-15 (×3): qty 1

## 2016-06-15 MED ORDER — PROPOFOL 10 MG/ML IV BOLUS
INTRAVENOUS | Status: AC
  Filled 2016-06-15: qty 20

## 2016-06-15 MED ORDER — ONDANSETRON HCL 4 MG/2ML IJ SOLN: 4.0000 mg | Freq: Once | INTRAMUSCULAR | Status: DC | PRN

## 2016-06-15 MED ORDER — PHENYLEPHRINE HCL 10 MG/ML IJ SOLN
INTRAMUSCULAR | Status: DC | PRN
  Administered 2016-06-15 (×2): 40 ug via INTRAVENOUS

## 2016-06-15 MED ORDER — BUPIVACAINE-EPINEPHRINE (PF) 0.5% -1:200000 IJ SOLN
INTRAMUSCULAR | Status: DC | PRN
  Administered 2016-06-15: 30 mL

## 2016-06-15 MED ORDER — ENDOCET 10-325 MG PO TABS: 1.0000 | ORAL_TABLET | ORAL | 0 refills | Status: DC | PRN

## 2016-06-15 MED ORDER — METHOCARBAMOL 500 MG PO TABS
500.0000 mg | ORAL_TABLET | Freq: Four times a day (QID) | ORAL | Status: DC | PRN
  Administered 2016-06-15 – 2016-06-16 (×2): 500 mg via ORAL
  Filled 2016-06-15 (×2): qty 1

## 2016-06-15 MED ORDER — LISINOPRIL-HYDROCHLOROTHIAZIDE 20-12.5 MG PO TABS: 1.0000 | ORAL_TABLET | Freq: Every day | ORAL | Status: DC

## 2016-06-15 MED ORDER — ACETAMINOPHEN 10 MG/ML IV SOLN
INTRAVENOUS | Status: AC
  Filled 2016-06-15: qty 100

## 2016-06-15 MED ORDER — ONDANSETRON HCL 4 MG/2ML IJ SOLN: 4.0000 mg | Freq: Four times a day (QID) | INTRAMUSCULAR | Status: DC | PRN

## 2016-06-15 MED ORDER — MIDAZOLAM HCL 2 MG/2ML IJ SOLN
INTRAMUSCULAR | Status: AC
  Filled 2016-06-15: qty 2

## 2016-06-15 MED ORDER — ACETAMINOPHEN 650 MG RE SUPP: 650.0000 mg | Freq: Four times a day (QID) | RECTAL | Status: DC | PRN

## 2016-06-15 MED ORDER — PHENOL 1.4 % MT LIQD: 1.0000 | OROMUCOSAL | Status: DC | PRN

## 2016-06-15 MED ORDER — BISACODYL 5 MG PO TBEC: 5.0000 mg | DELAYED_RELEASE_TABLET | Freq: Every day | ORAL | Status: DC | PRN

## 2016-06-15 MED ORDER — OXYCODONE HCL 5 MG PO TABS
ORAL_TABLET | ORAL | Status: AC
  Filled 2016-06-15: qty 2

## 2016-06-15 MED ORDER — METHOCARBAMOL 500 MG PO TABS: 500.0000 mg | ORAL_TABLET | Freq: Two times a day (BID) | ORAL | 0 refills | Status: DC

## 2016-06-15 MED ORDER — ALUMINUM HYDROXIDE GEL 320 MG/5ML PO SUSP: 15.0000 mL | ORAL | Status: DC | PRN

## 2016-06-15 MED ORDER — SCOPOLAMINE 1 MG/3DAYS TD PT72
MEDICATED_PATCH | TRANSDERMAL | Status: AC
  Filled 2016-06-15: qty 1

## 2016-06-15 MED ORDER — PROPOFOL 1000 MG/100ML IV EMUL
INTRAVENOUS | Status: AC
  Filled 2016-06-15: qty 200

## 2016-06-15 MED ORDER — FENTANYL CITRATE (PF) 100 MCG/2ML IJ SOLN
INTRAMUSCULAR | Status: AC
  Filled 2016-06-15: qty 2

## 2016-06-15 MED ORDER — HYDROMORPHONE HCL 2 MG/ML IJ SOLN
1.0000 mg | INTRAMUSCULAR | Status: DC | PRN
  Administered 2016-06-15 (×2): 1 mg via INTRAVENOUS
  Filled 2016-06-15 (×2): qty 1

## 2016-06-15 MED ORDER — 0.9 % SODIUM CHLORIDE (POUR BTL) OPTIME
TOPICAL | Status: DC | PRN
  Administered 2016-06-15: 1000 mL

## 2016-06-15 MED ORDER — FENTANYL CITRATE (PF) 100 MCG/2ML IJ SOLN
INTRAMUSCULAR | Status: AC
  Filled 2016-06-15: qty 4

## 2016-06-15 MED ORDER — FLEET ENEMA 7-19 GM/118ML RE ENEM: 1.0000 | ENEMA | Freq: Once | RECTAL | Status: DC | PRN

## 2016-06-15 MED ORDER — METOCLOPRAMIDE HCL 5 MG/ML IJ SOLN: 5.0000 mg | Freq: Three times a day (TID) | INTRAMUSCULAR | Status: DC | PRN

## 2016-06-15 MED ORDER — ASPIRIN EC 325 MG PO TBEC: 325.0000 mg | DELAYED_RELEASE_TABLET | Freq: Two times a day (BID) | ORAL | 0 refills | Status: DC

## 2016-06-15 MED ORDER — CELECOXIB 200 MG PO CAPS
200.0000 mg | ORAL_CAPSULE | Freq: Two times a day (BID) | ORAL | Status: DC
  Administered 2016-06-15 – 2016-06-17 (×4): 200 mg via ORAL
  Filled 2016-06-15 (×4): qty 1

## 2016-06-15 MED ORDER — BUPIVACAINE-EPINEPHRINE (PF) 0.25% -1:200000 IJ SOLN
INTRAMUSCULAR | Status: AC
  Filled 2016-06-15: qty 30

## 2016-06-15 MED ORDER — ONDANSETRON HCL 4 MG PO TABS: 4.0000 mg | ORAL_TABLET | Freq: Four times a day (QID) | ORAL | Status: DC | PRN

## 2016-06-15 MED ORDER — LACTATED RINGERS IV SOLN
INTRAVENOUS | Status: DC | PRN
  Administered 2016-06-15 (×2): via INTRAVENOUS

## 2016-06-15 MED ORDER — METHOCARBAMOL 500 MG PO TABS
ORAL_TABLET | ORAL | Status: AC
  Filled 2016-06-15: qty 1

## 2016-06-15 MED ORDER — MENTHOL 3 MG MT LOZG: 1.0000 | LOZENGE | OROMUCOSAL | Status: DC | PRN

## 2016-06-15 MED ORDER — LIDOCAINE HCL (CARDIAC) 20 MG/ML IV SOLN
INTRAVENOUS | Status: DC | PRN
  Administered 2016-06-15: 100 mg via INTRAVENOUS

## 2016-06-15 MED ORDER — DEXTROSE 5 % IV SOLN: 500.0000 mg | Freq: Four times a day (QID) | INTRAVENOUS | Status: DC | PRN

## 2016-06-15 MED ORDER — TRAMADOL HCL 50 MG PO TABS: 100.0000 mg | ORAL_TABLET | Freq: Three times a day (TID) | ORAL | Status: DC | PRN

## 2016-06-15 MED ORDER — BUPIVACAINE-EPINEPHRINE (PF) 0.25% -1:200000 IJ SOLN
INTRAMUSCULAR | Status: AC
Start: 2016-06-15 — End: 2016-06-15
  Filled 2016-06-15: qty 30

## 2016-06-15 MED ORDER — CEFAZOLIN SODIUM-DEXTROSE 2-4 GM/100ML-% IV SOLN
2.0000 g | INTRAVENOUS | Status: AC
  Administered 2016-06-15: 2 g via INTRAVENOUS

## 2016-06-15 MED ORDER — DEXAMETHASONE SODIUM PHOSPHATE 10 MG/ML IJ SOLN
10.0000 mg | Freq: Once | INTRAMUSCULAR | Status: AC
  Administered 2016-06-16: 10 mg via INTRAVENOUS
  Filled 2016-06-15: qty 1

## 2016-06-15 MED ORDER — OXYCODONE HCL 5 MG PO TABS
5.0000 mg | ORAL_TABLET | ORAL | Status: DC | PRN
  Administered 2016-06-15 – 2016-06-16 (×3): 10 mg via ORAL
  Administered 2016-06-16: 5 mg via ORAL
  Administered 2016-06-16 – 2016-06-17 (×6): 10 mg via ORAL
  Filled 2016-06-15 (×7): qty 2
  Filled 2016-06-15: qty 1
  Filled 2016-06-15: qty 2

## 2016-06-15 MED ORDER — DEXTROSE-NACL 5-0.45 % IV SOLN: INTRAVENOUS | Status: DC

## 2016-06-15 MED ORDER — POLYETHYLENE GLYCOL 3350 17 G PO PACK: 17.0000 g | PACK | Freq: Every day | ORAL | Status: DC | PRN

## 2016-06-15 MED ORDER — DIPHENHYDRAMINE HCL 12.5 MG/5ML PO ELIX
12.5000 mg | ORAL_SOLUTION | ORAL | Status: DC | PRN
  Administered 2016-06-15: 25 mg via ORAL
  Filled 2016-06-15: qty 10

## 2016-06-15 MED ORDER — DEXAMETHASONE SODIUM PHOSPHATE 10 MG/ML IJ SOLN
INTRAMUSCULAR | Status: DC | PRN
  Administered 2016-06-15: 5 mg via INTRAVENOUS

## 2016-06-15 MED ORDER — ONDANSETRON HCL 4 MG/2ML IJ SOLN
INTRAMUSCULAR | Status: DC | PRN
  Administered 2016-06-15: 4 mg via INTRAVENOUS

## 2016-06-15 SURGICAL SUPPLY — 44 items
BAG DECANTER FOR FLEXI CONT (MISCELLANEOUS) ×3 IMPLANT
BLADE SAW SGTL 18X1.27X75 (BLADE) ×2 IMPLANT
BLADE SAW SGTL 18X1.27X75MM (BLADE) ×1
CAPT HIP TOTAL 2 ×3 IMPLANT
COVER PERINEAL POST (MISCELLANEOUS) ×3 IMPLANT
COVER SURGICAL LIGHT HANDLE (MISCELLANEOUS) ×3 IMPLANT
DRAPE C-ARM 42X72 X-RAY (DRAPES) ×3 IMPLANT
DRAPE STERI IOBAN 125X83 (DRAPES) ×3 IMPLANT
DRAPE U-SHAPE 47X51 STRL (DRAPES) ×6 IMPLANT
DRSG AQUACEL AG ADV 3.5X10 (GAUZE/BANDAGES/DRESSINGS) ×3 IMPLANT
DURAPREP 26ML APPLICATOR (WOUND CARE) ×3 IMPLANT
ELECT BLADE 4.0 EZ CLEAN MEGAD (MISCELLANEOUS) ×3
ELECT REM PT RETURN 9FT ADLT (ELECTROSURGICAL) ×3
ELECTRODE BLDE 4.0 EZ CLN MEGD (MISCELLANEOUS) ×1 IMPLANT
ELECTRODE REM PT RTRN 9FT ADLT (ELECTROSURGICAL) ×1 IMPLANT
FACESHIELD WRAPAROUND (MASK) ×6 IMPLANT
GLOVE BIO SURGEON STRL SZ7.5 (GLOVE) ×3 IMPLANT
GLOVE BIO SURGEON STRL SZ8.5 (GLOVE) ×3 IMPLANT
GLOVE BIOGEL PI IND STRL 8 (GLOVE) ×1 IMPLANT
GLOVE BIOGEL PI IND STRL 9 (GLOVE) ×1 IMPLANT
GLOVE BIOGEL PI INDICATOR 8 (GLOVE) ×2
GLOVE BIOGEL PI INDICATOR 9 (GLOVE) ×2
GOWN STRL REUS W/ TWL LRG LVL3 (GOWN DISPOSABLE) ×1 IMPLANT
GOWN STRL REUS W/ TWL XL LVL3 (GOWN DISPOSABLE) ×2 IMPLANT
GOWN STRL REUS W/TWL LRG LVL3 (GOWN DISPOSABLE) ×2
GOWN STRL REUS W/TWL XL LVL3 (GOWN DISPOSABLE) ×4
KIT BASIN OR (CUSTOM PROCEDURE TRAY) ×3 IMPLANT
KIT ROOM TURNOVER OR (KITS) ×3 IMPLANT
MANIFOLD NEPTUNE II (INSTRUMENTS) ×3 IMPLANT
NEEDLE 22X1 1/2 OR ONLY (MISCELLANEOUS) ×4
NEEDLE 22X1.5 STRL (OR ONLY) (MISCELLANEOUS) ×2 IMPLANT
NS IRRIG 1000ML POUR BTL (IV SOLUTION) ×3 IMPLANT
PACK TOTAL JOINT (CUSTOM PROCEDURE TRAY) ×3 IMPLANT
PAD ARMBOARD 7.5X6 YLW CONV (MISCELLANEOUS) ×6 IMPLANT
SUT VIC AB 1 CTX 36 (SUTURE) ×2
SUT VIC AB 1 CTX36XBRD ANBCTR (SUTURE) ×1 IMPLANT
SUT VIC AB 2-0 CT1 27 (SUTURE) ×2
SUT VIC AB 2-0 CT1 TAPERPNT 27 (SUTURE) ×1 IMPLANT
SUT VIC AB 3-0 PS2 18 (SUTURE) ×2
SUT VIC AB 3-0 PS2 18XBRD (SUTURE) ×1 IMPLANT
SYR CONTROL 10ML LL (SYRINGE) ×6 IMPLANT
TOWEL OR 17X24 6PK STRL BLUE (TOWEL DISPOSABLE) ×3 IMPLANT
TOWEL OR 17X26 10 PK STRL BLUE (TOWEL DISPOSABLE) ×3 IMPLANT
TRAY CATH 16FR W/PLASTIC CATH (SET/KITS/TRAYS/PACK) IMPLANT

## 2016-06-15 NOTE — Anesthesia Preprocedure Evaluation (Addendum)
Anesthesia Evaluation  Patient identified by MRN, date of birth, ID band Patient awake    Reviewed: Allergy & Precautions, NPO status , Patient's Chart, lab work & pertinent test results  History of Anesthesia Complications (+) PONV  Airway Mallampati: I  TM Distance: >3 FB Neck ROM: Full    Dental   Pulmonary former smoker,    Pulmonary exam normal        Cardiovascular hypertension, Pt. on medications Normal cardiovascular exam     Neuro/Psych    GI/Hepatic GERD  Medicated and Controlled,  Endo/Other    Renal/GU      Musculoskeletal   Abdominal   Peds  Hematology   Anesthesia Other Findings   Reproductive/Obstetrics                             Anesthesia Physical Anesthesia Plan  ASA: II  Anesthesia Plan: General   Post-op Pain Management:    Induction: Intravenous  Airway Management Planned: Oral ETT  Additional Equipment:   Intra-op Plan:   Post-operative Plan: Extubation in OR  Informed Consent: I have reviewed the patients History and Physical, chart, labs and discussed the procedure including the risks, benefits and alternatives for the proposed anesthesia with the patient or authorized representative who has indicated his/her understanding and acceptance.     Plan Discussed with: CRNA and Surgeon  Anesthesia Plan Comments:         Anesthesia Quick Evaluation  

## 2016-06-15 NOTE — Evaluation (Signed)
Physical Therapy Evaluation Patient Details Name: Kathy MediciMyra L Hammes MRN: [id number] DOB: Sep 18, 1955 Today's Date: 06/15/2016   History of Present Illness  Pt is a 60 y.o. female now s/p direct anterior THA. PMH: HTN, Rt TKA, Rt THA.   Clinical Impression  Pt is s/p direct anterior THA resulting in the deficits listed below (see PT Problem List). Pt will benefit from skilled PT to increase their independence and safety with mobility to allow discharge to home with family assist.       Follow Up Recommendations Home health PT;Supervision for mobility/OOB    Equipment Recommendations  None recommended by PT    Recommendations for Other Services       Precautions / Restrictions Precautions Precautions: None Precaution Comments: HEP provided Restrictions Weight Bearing Restrictions: Yes LLE Weight Bearing: Weight bearing as tolerated      Mobility  Bed Mobility Overal bed mobility: Needs Assistance Bed Mobility: Supine to Sit     Supine to sit: Min assist     General bed mobility comments: assist provided with LLe  Transfers Overall transfer level: Needs assistance Equipment used: Rolling walker (2 wheeled) Transfers: Sit to/from Stand Sit to Stand: Min guard         General transfer comment: cues for hand placement  Ambulation/Gait Ambulation/Gait assistance: Min guard Ambulation Distance (Feet): 30 Feet Assistive device: Rolling walker (2 wheeled) Gait Pattern/deviations: Step-to pattern;Step-through pattern;Decreased weight shift to left Gait velocity: decreased   General Gait Details: progressing to step-through pattern, decreased weight and stride on Lt.   Stairs            Wheelchair Mobility    Modified Rankin (Stroke Patients Only)       Balance Overall balance assessment: Needs assistance Sitting-balance support: No upper extremity supported Sitting balance-Leahy Scale: Good     Standing balance support: Bilateral upper extremity  supported Standing balance-Leahy Scale: Poor Standing balance comment: using rw for support                             Pertinent Vitals/Pain Pain Assessment: 0-10 Pain Score: 6  Pain Location: Lt hip Pain Descriptors / Indicators: Aching Pain Intervention(s): Limited activity within patient's tolerance;Monitored during session;Ice applied    Home Living Family/patient expects to be discharged to:: Private residence Living Arrangements: Spouse/significant other Available Help at Discharge: Family;Available 24 hours/day Type of Home: House Home Access: Level entry     Home Layout: One level Home Equipment: Walker - 2 wheels;Bedside commode;Cane - single point      Prior Function Level of Independence: Independent         Comments: had to use rw just prior to coming in for surgery when going off medications.      Hand Dominance        Extremity/Trunk Assessment   Upper Extremity Assessment: Overall WFL for tasks assessed           Lower Extremity Assessment: LLE deficits/detail   LLE Deficits / Details: min assist needed to move LE in bed.      Communication   Communication: No difficulties  Cognition Arousal/Alertness: Awake/alert Behavior During Therapy: WFL for tasks assessed/performed Overall Cognitive Status: Within Functional Limits for tasks assessed                      General Comments      Exercises     Assessment/Plan    PT Assessment Patient needs  continued PT services  PT Problem List Decreased strength;Decreased range of motion;Decreased activity tolerance;Decreased balance;Decreased mobility          PT Treatment Interventions DME instruction;Gait training;Stair training;Functional mobility training;Therapeutic activities;Therapeutic exercise;Balance training;Patient/family education    PT Goals (Current goals can be found in the Care Plan section)  Acute Rehab PT Goals Patient Stated Goal: walk and be active  without pain PT Goal Formulation: With patient Time For Goal Achievement: 06/29/16 Potential to Achieve Goals: Good    Frequency 7X/week   Barriers to discharge        Co-evaluation               End of Session Equipment Utilized During Treatment: Gait belt Activity Tolerance: Patient tolerated treatment well Patient left: in chair;with call bell/phone within reach;with family/visitor present Nurse Communication: Mobility status;Weight bearing status    Functional Assessment Tool Used: clinical judgment Functional Limitation: Mobility: Walking and moving around Mobility: Walking and Moving Around Current Status [phone](G8978): At least 40 percent but less than 60 percent impaired, limited or restricted Mobility: Walking and Moving Around Goal Status [phone](G8979): At least 1 percent but less than 20 percent impaired, limited or restricted    Time: 1323-1349 PT Time Calculation (min) (ACUTE ONLY): 26 min   Charges:   PT Evaluation $PT Eval Moderate Complexity: 1 Procedure PT Treatments $Gait Training: 8-22 mins   PT G Codes:   PT G-Codes **NOT FOR INPATIENT CLASS** Functional Assessment Tool Used: clinical judgment Functional Limitation: Mobility: Walking and moving around Mobility: Walking and Moving Around Current Status ([id number](G8978): At least 40 percent but less than 60 percent impaired, limited or restricted Mobility: Walking and Moving Around Goal Status [phone](G8979): At least 1 percent but less than 20 percent impaired, limited or restricted    Christiane HaBenjamin J. , PT, CSCS Pager [phone] Office [phone]  06/15/2016, 1:59 PM

## 2016-06-15 NOTE — Anesthesia Procedure Notes (Signed)
Procedure Name: Intubation Date/Time: 06/15/2016 7:33 AM Performed by: Lodema HongSIMPSON,  ANN Pre-anesthesia Checklist: Patient identified, Emergency Drugs available, Suction available and Patient being monitored Patient Re-evaluated:Patient Re-evaluated prior to inductionOxygen Delivery Method: Circle system utilized Preoxygenation: Pre-oxygenation with 100% oxygen (lesion,scab upper center lip preop) Intubation Type: IV induction Ventilation: Mask ventilation without difficulty Laryngoscope Size: Mac and 3 Grade View: Grade II Tube size: 7.0 mm Number of attempts: 1 Airway Equipment and Method: Stylet Placement Confirmation: ETT inserted through vocal cords under direct vision,  positive ETCO2 and breath sounds checked- equal and bilateral Secured at: 22 cm Tube secured with: Tape Dental Injury: Teeth and Oropharynx as per pre-operative assessment

## 2016-06-15 NOTE — Transfer of Care (Signed)
Immediate Anesthesia Transfer of Care Note  Patient: Genessis L Kloehn  Procedure(s) Performed: Procedure(s): TOTAL HIP ARTHROPLASTY ANTERIOR APPROACH (Left)  Patient Location: PACU  Anesthesia Type:General  Level of Consciousness: sedated  Airway & Oxygen Therapy: Patient connected to nasal cannula oxygen  Post-op Assessment: Report given to RN and Post -op Vital signs reviewed and stable  Post vital signs: Reviewed and stable  Last Vitals:  Vitals:   06/15/16 0628  BP: (!) 120/59  Pulse: 69  Resp: 18  Temp: 37 C    Last Pain:  Vitals:   06/15/16 0628  TempSrc: Oral  PainSc:       Patients Stated Pain Goal: 5 (06/15/16 [id number])  Complications: No apparent anesthesia complications

## 2016-06-15 NOTE — Op Note (Signed)
OPERATIVE REPORT    DATE OF PROCEDURE:  06/15/2016       PREOPERATIVE DIAGNOSIS:  LEFT HIP OSTEOARTHRITIS                                                          POSTOPERATIVE DIAGNOSIS:  osteoarthritis left hip                                                           PROCEDURE: Anterior L total hip arthroplasty using a 48 mm DePuy Pinnacle  Cup, Pe[place]ody E[employer] Hole Eliminator, 0-degree polyethylene liner, a +5 32 mm ceramic head, a 4 Depuy Triloc stem   SURGEON: , J    ASSISTANT:   Eric K. [employer] PA-C  (present throughout entire procedure and necessary for timely completion of the procedure)   ANESTHESIA: GET BLOOD LOSS: 400 FLUID REPLACEMENT: 1600 crystalloid Antibiotic: 2gm ancef Tranexamic Acid: 1gm iv, 2gm topical COMPLICATIONS: none    INDICATIONS FOR PROCEDURE: A 60 y.o. year-old With  LEFT HIP OSTEOARTHRITIS   for 3 years, x-rays show bone-on-bone arthritic changes, and osteophytes. Despite conservative measures with observation, anti-inflammatory medicine, narcotics, use of a cane, has severe unremitting pain and can ambulate only a few blocks before resting. Patient desires elective L total hip arthroplasty to decrease pain and increase function. The risks, benefits, and alternatives were discussed at length including but not limited to the risks of infection, bleeding, nerve injury, stiffness, blood clots, the need for revision surgery, cardiopulmonary complications, among others, and they were willing to proceed. Questions answered     PROCEDURE IN DETAIL: The patient was identified by armband,  [facility], taken to the operating room , appropriate anesthetic monitors  were attached and  anesthesia was induced with the patienton the gurney. The HANA boots were applied to the feet and he was then transferred to the HANA table with a peroneal post and support underneath the non-operative le, which  was locked in 5 lb traction. Theoperative lower extremity was then prepped and draped in the usual sterile fashion from just above the iliac crest to the knee. And a timeout procedure was performed. We then made a 12 cm incision along the interval at the leading edge of the tensor fascia lata of starting at 2 cm lateral to and 2 cm distal to the ASIS. Small bleeders in the skin and subcutaneous tissue identified and cauterized we dissected down to the fascia and made an incision in the fascia allowing us to elevate the fascia of the tensor muscle and exploited the interval between the rectus and the tensor fascia lata. A Hohmann retractor was then placed along the superior neck of the femur and a Cobra retractor along the inferior neck of the femur we teed the capsule starting out at the superior anterior aspect of the acetabulum going distally and made the T along the neck both leaflets of the T were tagged with #2 Ethibond suture. Cobra retractors were then placed along the inferior and superior neck allowing us to perform a standard neck cut and removed  the femoral head with a power corkscrew. We then placed a right angle Hohmann retractor along the anterior aspect of the acetabulum a spiked Cobra in the cotyloid notch and posteriorly a Muelller retractor. We then sequentially reamed up to a 47 mm basket reamer obtaining good coverage in all quadrants, verified by C-arm imaging. Under C-arm control with and hammered into place a 48 mm Pinnacle cup in 45 of abduction and 15 of anteversion. The cup seated nicely and required no supplemental screws. We then placed a central hole Eliminator and a 0 polyethylene liner. The foot was then externally rotated to 110, the HANA elevator was placed around the flare of the greater trochanter and the limb was extended and abducted delivering the proximal femur up into the wound. A medium Hohmann retractor was placed over the greater trochanter and a Mueller retractor along  the posterior femoral neck completing the exposure. We then performed releases superiorly and and inferiorly of the capsule going back to the pirformis fossa superiorly and to the lesser trochanter inferiorly. We then entered the proximal femur with the box cutting offset chisel followed by, a canal sounder, the chili pepper and broaching up to a 4 broach. This seated nicely and we reamed the calcar. A trial reduction was performed with a 5 mm 32 mm head.The limb lengths were excellent the hip was stable in 90 of external rotation. At this point the trial components removed and we hammered into place a # 4 Tri-Lock stem with Gryption coating. This was a std offset stem and a + 5 32 mm ceramic ball was then hammered into place the hip was reduced and final C-arm images obtained. The wound was thoroughly irrigated with normal saline solution. We repaired the ant capsule and the tensor fascia lot a with running 0 vicryl suture. the subcutaneous tissue was closed with 2-0 and 3-0 Vicryl suture followed by an Aquacil dressing. At this point the patient was awaken and transferred to hospital gurney without difficulty. The subcutaneous tissue with 0 and 2-0 undyed Vicryl suture and the skin with running  3-0 vicryl subcuticular suture. Aquacil dressing was applied. The patient was then unclamped, rolled supine, awaken extubated and taken to recovery room without difficulty in stable condition.   , J 06/15/2016, 10:19 AM

## 2016-06-15 NOTE — Interval H&P Note (Signed)
History and Physical Interval Note:  06/15/2016 7:15 AM  Kathy Coleman  has presented today for surgery, with the diagnosis of LEFT HIP OSTEOARTHRITIS  The various methods of treatment have been discussed with the patient and family. After consideration of risks, benefits and other options for treatment, the patient has consented to  Procedure(s): TOTAL HIP ARTHROPLASTY ANTERIOR APPROACH (Left) as a surgical intervention .  The patient's history has been reviewed, patient examined, no change in status, stable for surgery.  I have reviewed the patient's chart and labs.  Questions were answered to the patient's satisfaction.     Nestor LewandowskyOWAN, J

## 2016-06-15 NOTE — Evaluation (Signed)
Occupational Therapy Evaluation Patient Details Name: Kathy MediciMyra L Tolleson MRN: [id number] DOB: [birth year] Today's Date: 06/15/2016    History of Present Illness Pt is a 60 y.o. female now s/p direct anterior THA. PMH: HTN, Rt TKA, Rt THA.    Clinical Impression  Patient presenting with decreased I in self care, functional mobility/transfers, balance, and safety awareness. Patient reports being mod I PTA. Patient currently functioning at min A overall. Patient will benefit from acute OT to increase overall independence in the areas of ADLs, functional mobility, and safety awareness in order to safely discharge home.    Follow Up Recommendations  No OT follow up;Supervision/Assistance - 24 hour    Equipment Recommendations  None recommended by OT (Based on Pt report she has all needed equipment)    Recommendations for Other Services       Precautions / Restrictions Precautions Precautions: None Precaution Comments: HEP provided Restrictions Weight Bearing Restrictions: Yes LLE Weight Bearing: Weight bearing as tolerated      Mobility Bed Mobility Overal bed mobility: Needs Assistance Bed Mobility: Sit to Supine     Supine to sit: Min assist     General bed mobility comments: assist provided for LLE  Transfers Overall transfer level: Needs assistance Equipment used: Rolling walker (2 wheeled) Transfers: Sit to/from Stand Sit to Stand: Min guard         General transfer comment: cues for hand placement    Balance Overall balance assessment: Needs assistance Sitting-balance support: No upper extremity supported Sitting balance-Leahy Scale: Good     Standing balance support: Bilateral upper extremity supported Standing balance-Leahy Scale: Poor Standing balance comment: using rw for support                            ADL Overall ADL's : Needs assistance/impaired     Grooming: Min guard;Standing                   Toilet Transfer: Min  guard;Comfort height toilet;RW;Ambulation   Toileting- ArchitectClothing Manipulation and Hygiene: Min guard;Sit to/from stand         General ADL Comments: Pt required min guard for ambulating with RW into bathroom for toileting needs. Pt sitting on elevated toilet seat with steady assistance for clothing management and hygiene as well. Pt washing hands at sink and washing buttocks and peri area while in standing position with steady assist as B UEs are unsupported.                Pertinent Vitals/Pain Pain Assessment: 0-10 Pain Score: 7  Pain Location: L hip Pain Descriptors / Indicators: Aching Pain Intervention(s): Repositioned;Monitored during session     Hand Dominance Right   Extremity/Trunk Assessment Upper Extremity Assessment Upper Extremity Assessment: Overall WFL for tasks assessed   Lower Extremity Assessment Lower Extremity Assessment: Defer to PT evaluation LLE Deficits / Details: min assist needed to move LE in bed.        Communication Communication Communication: No difficulties   Cognition Arousal/Alertness: Awake/alert Behavior During Therapy: WFL for tasks assessed/performed Overall Cognitive Status: Within Functional Limits for tasks assessed                                Home Living Family/patient expects to be discharged to:: Private residence Living Arrangements: Spouse/significant other Available Help at Discharge: Family;Available 24 hours/day Type of Home: House Home Access: Level  entry     Home Layout: One level     Bathroom Shower/Tub: Walk-in shower;Door     Bathroom Accessibility: Yes   Home Equipment: Walker - 2 wheels;Bedside commode;Cane - single point;Shower seat          Prior Functioning/Environment Level of Independence: Independent        Comments: had to use rw just prior to coming in for surgery when going off medications.         OT Problem List: Decreased strength;Decreased activity  tolerance;Impaired balance (sitting and/or standing);Decreased safety awareness;Decreased range of motion;Pain;Decreased knowledge of precautions;Decreased knowledge of use of DME or AE   OT Treatment/Interventions: Self-care/ADL training;Therapeutic exercise;Energy conservation;DME and/or AE instruction;Therapeutic activities;Balance training;Patient/family education;Manual therapy    OT Goals(Current goals can be found in the care plan section) Acute Rehab OT Goals Patient Stated Goal: walk and be active without pain OT Goal Formulation: With patient Time For Goal Achievement: 06/29/16 Potential to Achieve Goals: Good ADL Goals Pt Will Perform Grooming: with modified independence Pt Will Perform Upper Body Bathing: with modified independence Pt Will Perform Lower Body Bathing: with supervision;sit to/from stand;with adaptive equipment Pt Will Perform Upper Body Dressing: with modified independence;sitting Pt Will Perform Lower Body Dressing: with supervision;sit to/from stand Pt Will Transfer to Toilet: with modified independence;ambulating Pt Will Perform Toileting - Clothing Manipulation and hygiene: with modified independence;sit to/from stand Pt Will Perform Tub/Shower Transfer: with supervision;shower seat;ambulating;rolling walker  OT Frequency: Min 2X/week   Barriers to D/C:    none known at this time          End of Session Equipment Utilized During Treatment: Rolling walker  Activity Tolerance: Patient tolerated treatment well Patient left: in bed;with call bell/phone within reach;with family/visitor present   Time: 1446-1510 OT Time Calculation (min): 24 min Charges:  OT General Charges $OT Visit: 1 Procedure OT Evaluation $OT Eval Moderate Complexity: 1 Procedure OT Treatments $Self Care/Home Management : 8-22 mins G-Codes:    Alen BleacherBradsher,  P, MS, OTR/L 06/15/2016, 3:57 PM

## 2016-06-15 NOTE — Discharge Instructions (Signed)

## 2016-06-15 NOTE — Anesthesia Postprocedure Evaluation (Signed)
Anesthesia Post Note  Patient: Kathy Coleman  Procedure(s) Performed: Procedure(s) (LRB): TOTAL HIP ARTHROPLASTY ANTERIOR APPROACH (Left)  Patient location during evaluation: PACU Anesthesia Type: General Level of consciousness: awake and alert Pain management: pain level controlled Vital Signs Assessment: post-procedure vital signs reviewed and stable Respiratory status: spontaneous breathing, nonlabored ventilation, respiratory function stable and patient connected to nasal cannula oxygen Cardiovascular status: blood pressure returned to baseline and stable Postop Assessment: no signs of nausea or vomiting Anesthetic complications: no    Last Vitals:  Vitals:   06/15/16 1008 06/15/16 1130  BP:  (!) 143/86  Pulse: 81   Resp: 15   Temp: 36.7 C     Last Pain:  Vitals:   06/15/16 1041  TempSrc:   PainSc: 6                  , DAVID

## 2016-06-16 ENCOUNTER — Encounter (HOSPITAL_COMMUNITY): Payer: Self-pay | Admitting: Orthopedic Surgery

## 2016-06-16 LAB — BASIC METABOLIC PANEL
Anion gap: 6 (ref 5–15)
BUN: 16 mg/dL (ref 6–20)
CALCIUM: 7.9 mg/dL — AB (ref 8.9–10.3)
CHLORIDE: 98 mmol/L — AB (ref 101–111)
CO2: 25 mmol/L (ref 22–32)
CREATININE: 1.07 mg/dL — AB (ref 0.44–1.00)
GFR calc non Af Amer: 55 mL/min — ABNORMAL LOW (ref >60)
Glucose, Bld: 102 mg/dL — ABNORMAL HIGH (ref 65–99)
Potassium: 3.9 mmol/L (ref 3.5–5.1)
SODIUM: 129 mmol/L — AB (ref 135–145)

## 2016-06-16 LAB — CBC
HCT: 21.4 % — ABNORMAL LOW (ref 36.0–46.0)
HEMOGLOBIN: 7.3 g/dL — AB (ref 12.0–15.0)
MCH: 31.1 pg (ref 26.0–34.0)
MCHC: 34.1 g/dL (ref 30.0–36.0)
MCV: 91.1 fL (ref 78.0–100.0)
Platelets: 168 10*3/uL (ref 150–400)
RBC: 2.35 MIL/uL — ABNORMAL LOW (ref 3.87–5.11)
RDW: 13.2 % (ref 11.5–15.5)
WBC: 6 10*3/uL (ref 4.0–10.5)

## 2016-06-16 LAB — PREPARE RBC (CROSSMATCH)

## 2016-06-16 MED ORDER — WHITE PETROLATUM GEL
Status: AC
  Administered 2016-06-16: 1
  Filled 2016-06-16: qty 1

## 2016-06-16 MED ORDER — SODIUM CHLORIDE 0.9 % IV SOLN
Freq: Once | INTRAVENOUS | Status: AC
  Administered 2016-06-16: 10:00:00 via INTRAVENOUS

## 2016-06-16 NOTE — Progress Notes (Signed)
Occupational Therapy Treatment Patient Details Name: Kathy MediciMyra L Coleman MRN: [id number] DOB: 12-17-1955 Today's Date: 06/16/2016    History of present illness Pt is a 60 y.o. female now s/p direct anterior THA. PMH: HTN, Rt TKA, Rt THA.   Pt with ABL anemia requring 1 unit of PRBCs post-op.     OT comments  Pt progressing toward OT goals. Continued to have soft BP when compared to baseline but not symptomatic with ADL. Pt able to complete shower transfer with min guard assist and LB ADL with min assist. She was able to complete toilet transfers with supervision. Continued education on use of AE for LB ADL, safety with functional transfers, fall prevention and energy conservation. Pt would benefit from continued OT services while admitted to improve independence with ADL and functional mobility. OT will continue to follow acutely.   Follow Up Recommendations  No OT follow up;Supervision/Assistance - 24 hour    Equipment Recommendations  None recommended by OT    Recommendations for Other Services      Precautions / Restrictions Precautions Precautions: None Precaution Comments: HEP provided Restrictions Weight Bearing Restrictions: Yes LLE Weight Bearing: Weight bearing as tolerated       Mobility Bed Mobility               General bed mobility comments: Pt was OOB in chair for her second session.   Transfers Overall transfer level: Needs assistance Equipment used: Rolling walker (2 wheeled) Transfers: Sit to/from Stand Sit to Stand: Supervision         General transfer comment: supervision for safety. Verbal cues for safe hand placement and RW use.     Balance Overall balance assessment: Needs assistance Sitting-balance support: Feet supported;No upper extremity supported Sitting balance-Leahy Scale: Good     Standing balance support: Bilateral upper extremity supported;No upper extremity supported;Single extremity supported;During functional activity Standing  balance-Leahy Scale: Fair Standing balance comment: Uses RW for support for dynamic tasks but able to statically stand with single and no UE support.                   ADL Overall ADL's : Needs assistance/impaired                     Lower Body Dressing: Minimal assistance;Sit to/from stand   Toilet Transfer: Supervision/safety;Ambulation;BSC;RW   Toileting- Clothing Manipulation and Hygiene: Supervision/safety;Sit to/from stand   Tub/ Shower Transfer: Walk-in shower;Min guard;3 in 1;Ambulation;Rolling walker   Functional mobility during ADLs: Supervision/safety;Rolling walker;Min guard General ADL Comments: Pt able to ambulate for functional tasks with supervision for safety. Pt required min guard assist for walk-in shower transfer. Educated on use of AE for LB ADL and pt already has these and feels comfortable using these for LB ADL.      Vision                     Perception     Praxis      Cognition   Behavior During Therapy: [facility] for tasks assessed/performed Overall Cognitive Status: Within Functional Limits for tasks assessed                       Extremity/Trunk Assessment               Exercises Total Joint Exercises Ankle Circles/Pumps: AROM;Both;20 reps Long Arc Quad: AROM;Left;10 reps   Shoulder Instructions       General Comments  Pertinent Vitals/ Pain       Pain Assessment: 0-10 Pain Score: 7  Pain Location: left hip and thigh Pain Descriptors / Indicators: Cramping Pain Intervention(s): Limited activity within patient's tolerance;Monitored during session;Repositioned  Home Living Family/patient expects to be discharged to:: Private residence Living Arrangements: Spouse/significant other                                      Prior Functioning/Environment              Frequency  Min 2X/week        Progress Toward Goals  OT Goals(current goals can now be found in the care plan  section)  Progress towards OT goals: Progressing toward goals  Acute Rehab OT Goals Patient Stated Goal: walk and be active without pain OT Goal Formulation: With patient Time For Goal Achievement: 06/29/16 Potential to Achieve Goals: Good ADL Goals Pt Will Perform Grooming: with modified independence Pt Will Perform Upper Body Bathing: with modified independence Pt Will Perform Lower Body Bathing: with supervision;sit to/from stand;with adaptive equipment Pt Will Perform Upper Body Dressing: with modified independence;sitting Pt Will Perform Lower Body Dressing: with supervision;sit to/from stand Pt Will Transfer to Toilet: with modified independence;ambulating Pt Will Perform Toileting - Clothing Manipulation and hygiene: with modified independence;sit to/from stand Pt Will Perform Tub/Shower Transfer: with supervision;shower seat;ambulating;rolling walker  Plan Discharge plan remains appropriate    Co-evaluation                 End of Session Equipment Utilized During Treatment: Rolling walker   Activity Tolerance Patient tolerated treatment well   Patient Left in bed;with call bell/phone within reach;with family/visitor present   Nurse Communication          Time: 4540-98111422-1442 OT Time Calculation (min): 20 min  Charges: OT General Charges $OT Visit: 1 Procedure OT Treatments $Self Care/Home Management : 8-22 mins  Doristine SectionCharity A , OTR/L [phone] 06/16/2016, 4:30 PM

## 2016-06-16 NOTE — Progress Notes (Signed)
Physical Therapy Treatment Patient Details Name: Kathy Coleman MRN: [id number] DOB: 09-02-1955 Today's Date: 06/16/2016    History of Present Illness Pt is a 60 y.o. female now s/p direct anterior THA. PMH: HTN, Rt TKA, Rt THA.     PT Comments    Pt is POD #1 and is moving well, supervision with RW a very good distance down the hallway.  Standing exercises preformed.  Pt is asymptomatic and her one unit of blood is finishing when PT is working with her.  She looks as though she will progress well.    Follow Up Recommendations  Home health PT;Supervision for mobility/OOB     Equipment Recommendations  None recommended by PT    Recommendations for Other Services   NA     Precautions / Restrictions Precautions Precautions: None Precaution Comments: HEP provided Restrictions Weight Bearing Restrictions: Yes LLE Weight Bearing: Weight bearing as tolerated    Mobility  Bed Mobility Overal bed mobility: Needs Assistance Bed Mobility: Supine to Sit     Supine to sit: Min guard;HOB elevated     General bed mobility comments: light min gurad assist to help progress left leg over EOB.   HOB mildly elevated and pt using railing for support  Transfers Overall transfer level: Needs assistance Equipment used: Rolling walker (2 wheeled) Transfers: Sit to/from Stand Sit to Stand: Min guard         General transfer comment: Min guard assist for safety during transitions. Verbal cues for safe hand placement.   Ambulation/Gait Ambulation/Gait assistance: Supervision Ambulation Distance (Feet): 200 Feet Assistive device: Rolling walker (2 wheeled) Gait Pattern/deviations: Step-through pattern;Antalgic Gait velocity: decreased Gait velocity interpretation: Below normal speed for age/gender General Gait Details: Very mildly antalgic gait pattern.  Supervision for safety.            Balance Overall balance assessment: Needs assistance Sitting-balance support: Feet  supported;No upper extremity supported Sitting balance-Leahy Scale: Good     Standing balance support: Bilateral upper extremity supported;No upper extremity supported;Single extremity supported Standing balance-Leahy Scale: Fair                      Cognition Arousal/Alertness: Awake/alert Behavior During Therapy: WFL for tasks assessed/performed Overall Cognitive Status: Within Functional Limits for tasks assessed                      Exercises Total Joint Exercises Hip ABduction/ADduction: AROM;Left;10 reps;Standing Knee Flexion: AROM;Left;10 reps;Standing Marching in Standing: AROM;Left;10 reps;Standing Standing Hip Extension: AROM;Left;10 reps;Standing        Pertinent Vitals/Pain Pain Assessment: 0-10 Pain Score: 7  Pain Location: left hip Pain Descriptors / Indicators: Cramping Pain Intervention(s): Limited activity within patient's tolerance;Monitored during session;Repositioned           PT Goals (current goals can now be found in the care plan section) Acute Rehab PT Goals Patient Stated Goal: walk and be active without pain Progress towards PT goals: Progressing toward goals    Frequency    7X/week      PT Plan Current plan remains appropriate       End of Session   Activity Tolerance: Patient limited by pain Patient left: in chair;with call bell/phone within reach;with family/visitor present;with nursing/sitter in room     Time: 1142-1203 PT Time Calculation (min) (ACUTE ONLY): 21 min  Charges:  $Gait Training: 8-22 mins  Rollene Rotundaebecca B. , PT, DPT [phone]   06/16/2016, 12:10 PM

## 2016-06-16 NOTE — Care Management Note (Signed)
Case Management Note  Patient Details  Name: Kathy MediciMyra L Coleman MRN: [id number] Date of Birth: 04/24/1956  Subjective/Objective:    60 yr old female s/p left total hip arthroplasty.                 Action/Plan: Case manager spoke with patient concerning Home Health and DME needs. Choice was offered for Home Health Agency. [place] in [place] and [facility] Case manager called Bonita QuinLinda @ [phone], faxed orders, Demographics, OP note and H & P to her at [phone]. Start of Care will be 12/14 or 06/19/2016 depending on available staff. Patient states she has rolling walker and 3in1 and will have assistance from family at discharge.    Expected Discharge Date:   06/17/16               Expected Discharge Plan:  Home w Home Health Services  In-House Referral:  NA  Discharge planning Services  CM Consult  Post Acute Care Choice:  Home Health Choice offered to:  Patient  DME Arranged:  N/A DME Agency:  NA  HH Arranged:  PT HH Agency:     Status of Service:  Completed, signed off  If discussed at Long Length of Stay Meetings, dates discussed:    Additional Comments:  Durenda GuthrieBrady,  Naomi, RN 06/16/2016, 2:16 PM

## 2016-06-16 NOTE — Progress Notes (Signed)
Patient had hypotensive episode 80/52 at the start of the shift;patient remained alert,oriented and responsive;called doc on call and gave order for a one time bolus of 500 cc NS ; after the first run patient's SBP remained below 90s; doc on call gave another order of 500 cc bolus NS and after the second run patient's BP went up to 94/50 taken manually on the right arm.Monitoring to continue.

## 2016-06-16 NOTE — Progress Notes (Signed)
   06/16/16 1549  PT Visit Information  Last PT Received On 06/16/16  Assistance Needed +1  History of Present Illness Pt is a 60 y.o. female now s/p direct anterior THA. PMH: HTN, Rt TKA, Rt THA.   Pt with ABL anemia requring 1 unit of PRBCs post-op.    Subjective Data  Subjective Pt reports that she has no lightheaded when up and moving with PT today.  Some cramping in her left upper thigh.   Patient Stated Goal walk and be active without pain  Precautions  Precautions None  Precaution Comments HEP provided  Restrictions  LLE Weight Bearing WBAT  Pain Assessment  Pain Assessment 0-10  Pain Score 6  Pain Location left hip and thigh  Pain Descriptors / Indicators Cramping  Pain Intervention(s) Limited activity within patient's tolerance;Monitored during session;Repositioned  Cognition  Arousal/Alertness Awake/alert  Behavior During Therapy WFL for tasks assessed/performed  Overall Cognitive Status Within Functional Limits for tasks assessed  Bed Mobility  General bed mobility comments Pt was OOB in chair for her second session.   Transfers  Overall transfer level Needs assistance  Equipment used Rolling walker (2 wheeled)  Transfers Sit to/from Stand  Sit to Stand Supervision  General transfer comment supervision for safety. Verbal cues for safe hand placement and RW use.   Ambulation/Gait  Ambulation/Gait assistance Supervision  Ambulation Distance (Feet) 510 Feet  Assistive device Rolling walker (2 wheeled)  Gait Pattern/deviations Step-through pattern;Antalgic  General Gait Details Pt with very mildly antalgic gait pattern, safe use of RW.   Balance  Overall balance assessment Needs assistance  Sitting-balance support Feet supported;No upper extremity supported  Sitting balance-Leahy Scale Good  Standing balance support Bilateral upper extremity supported;No upper extremity supported;Single extremity supported  Standing balance-Leahy Scale Fair  Exercises  Exercises  Total Joint (limited due to OT coming in to work with her)  Total Joint Exercises  Ankle Circles/Pumps AROM;Both;20 reps  Long Arc Quad AROM;Left;10 reps  PT - End of Session  Activity Tolerance Patient limited by pain  Patient left in chair;Other (comment) (working with OT)  PT - Assessment/Plan  PT Plan Current plan remains appropriate  PT Frequency (ACUTE ONLY) 7X/week  Follow Up Recommendations Home health PT;Supervision for mobility/OOB  PT equipment None recommended by PT  PT Goal Progression  Progress towards PT goals Progressing toward goals  PT Time Calculation  PT Start Time (ACUTE ONLY) 1412  PT Stop Time (ACUTE ONLY) 1422  PT Time Calculation (min) (ACUTE ONLY) 10 min  PT General Charges  $$ ACUTE PT VISIT 1 Procedure  PT Treatments  $Gait Training 8-22 mins  06/16/2016 This is pt's second session today and she is progressing nicely with gait distance and decreased assistance needed.  Her BPs continue to be soft compared to baseline, but she is asymptomatic with gait and mobility.  PT will continue to follow acutely.  Rollene Rotundaebecca B. , PT, DPT [phone]

## 2016-06-16 NOTE — Progress Notes (Signed)
PATIENT ID: Kathy MediciMyra L Hammersmith  MRN: [id number]  DOB/AGE:  61-May-1957 / 61 y.o.  1 Day Post-Op Procedure(s) (LRB): TOTAL HIP ARTHROPLASTY ANTERIOR APPROACH (Left)    PROGRESS NOTE Subjective: Patient is alert, oriented, no Nausea, no Vomiting, yes passing gas, . Taking PO well. Denies SOB, Chest or Calf Pain. Using Incentive Spirometer, PAS in place. Ambulate WBAT, up in chair this morning Patient reports pain as  5/10  .    Objective: Vital signs in last 24 hours: Vitals:   06/15/16 2354 06/16/16 0027 06/16/16 0215 06/16/16 0423  BP: (!) 94/50 (!) 73/45 (!) 92/48 (!) 92/46  Pulse:  81  81  Resp:  16    Temp:  99.1 F (37.3 C)  97.6 F (36.4 C)  TempSrc:  Oral  Oral  SpO2:  100%  98%      Intake/Output from previous day: I/O last 3 completed shifts: In: 1733.3 [P.O.:300; I.V.:1433.3] Out: 452 [Urine:2; Blood:450]   Intake/Output this shift: No intake/output data recorded.   LABORATORY DATA:  Recent Labs  06/16/16 0428  WBC 6.0  HGB 7.3*  HCT 21.4*  PLT 168  NA 129*  K 3.9  CL 98*  CO2 25  BUN 16  CREATININE 1.07*  GLUCOSE 102*  CALCIUM 7.9*    Examination: Neurologically intact ABD soft Neurovascular intact Sensation intact distally Intact pulses distally Dorsiflexion/Plantar flexion intact Incision: dressing C/D/I No cellulitis present Compartment soft} XR AP&Lat of hip shows well placed\fixed THA  Assessment:   1 Day Post-Op Procedure(s) (LRB): TOTAL HIP ARTHROPLASTY ANTERIOR APPROACH (Left) ADDITIONAL DIAGNOSIS:  Expected Acute Blood Loss Anemia,   Plan: PT/OT WBAT, THA, patient is feeling weak when she gets up we'll transfuse 1 unit of packed red blood cells for hemoglobin of 7.3 with symptoms.  DVT Prophylaxis: SCDx72 hrs, ASA 325 mg BID x 2 weeks  DISCHARGE PLAN: Home  DISCHARGE NEEDS: HHPT, Walker and 3-in-1 comode seat

## 2016-06-16 NOTE — Progress Notes (Signed)
RN called to make me aware of SBP 70-80's. MD had been paged prior to calling RRT. New orders for 500 cc bolus x2. Repaet manual BP after infusing resulting 90/52 and  94/50 by two different RN's. Pt alert oriented x4, answers questions appropriately, skin warm and dry. No distress noted with pt. No interventions from RRT.   Harvie Heckandy RN and Fleet Contrasachel RN encouraged to call with any concerns.  F/U with RN 0150, pt resting well, SBP 90's.

## 2016-06-17 LAB — TYPE AND SCREEN
BLOOD PRODUCT EXPIRATION DATE: 201712192359
ISSUE DATE / TIME: 201712120946
Unit Type and Rh: 5100

## 2016-06-17 LAB — CBC
HCT: 24 % — ABNORMAL LOW (ref 36.0–46.0)
HEMOGLOBIN: 8.1 g/dL — AB (ref 12.0–15.0)
MCH: 29.8 pg (ref 26.0–34.0)
MCHC: 33.8 g/dL (ref 30.0–36.0)
MCV: 88.2 fL (ref 78.0–100.0)
PLATELETS: 176 10*3/uL (ref 150–400)
RBC: 2.72 MIL/uL — ABNORMAL LOW (ref 3.87–5.11)
RDW: 14.1 % (ref 11.5–15.5)
WBC: 6.5 10*3/uL (ref 4.0–10.5)

## 2016-06-17 NOTE — Discharge Summary (Signed)
Patient ID: Kathy Coleman MRN: [id number] DOB/AGE: [birth year] 60 y.o.  Admit date: 06/15/2016 Discharge date: 06/17/2016  Admission Diagnoses:  Principal Problem:   Primary osteoarthritis of left hip Active Problems:   Primary localized osteoarthritis of left hip   Discharge Diagnoses:  Same  Past Medical History:  Diagnosis Date  . Arthritis   . Dysphagia   . GERD (gastroesophageal reflux disease)   . History of blood transfusion   . History of kidney stones   . Hypertension   . Osteoarthritis    left hip  . PONV (postoperative nausea and vomiting)   . Wears glasses     Surgeries: Procedure(s): TOTAL HIP ARTHROPLASTY ANTERIOR APPROACH on 06/15/2016   Consultants:   Discharged Condition: Improved  Hospital Course: Kathy Coleman is an 60 y.o. female who was admitted 06/15/2016 for operative treatment ofPrimary osteoarthritis of left hip. Patient has severe unremitting pain that affects sleep, daily activities, and work/hobbies. After pre-op clearance the patient was taken to the operating room on 06/15/2016 and underwent  Procedure(s): TOTAL HIP ARTHROPLASTY ANTERIOR APPROACH.    Patient was given perioperative antibiotics: Anti-infectives    Start     Dose/Rate Route Frequency Ordered Stop   06/15/16 0530  ceFAZolin (ANCEF) IVPB 2g/100 mL premix     2 g 200 mL/hr over 30 Minutes Intravenous On call to O.R. 06/15/16 0530 06/15/16 0740       Patient was given sequential compression devices, early ambulation, and chemoprophylaxis to prevent DVT.  Patient benefited maximally from hospital stay and there were no complications.    Recent vital signs: Patient Vitals for the past 24 hrs:  BP Temp Temp src Pulse Resp SpO2  06/17/16 0443 (!) 104/51 98.5 F (36.9 C) Oral 80 16 100 %  06/16/16 2217 (!) 113/53 98.7 F (37.1 C) Oral 83 18 100 %  06/16/16 1335 (!) 91/47 98.7 F (37.1 C) Oral 86 18 100 %  06/16/16 1300 (!) 95/42 97.8 F (36.6 C) Oral 90 16 100 %   06/16/16 1033 (!) 112/54 98.8 F (37.1 C) Oral 75 - -  06/16/16 1012 (!) 116/56 97.8 F (36.6 C) Oral 90 - -     Recent laboratory studies:  Recent Labs  06/16/16 0428 06/17/16 0359  WBC 6.0 6.5  HGB 7.3* 8.1*  HCT 21.4* 24.0*  PLT 168 176  NA 129*  --   K 3.9  --   CL 98*  --   CO2 25  --   BUN 16  --   CREATININE 1.07*  --   GLUCOSE 102*  --   CALCIUM 7.9*  --      Discharge Medications:     Medication List    TAKE these medications   aspirin EC 325 MG tablet Take 1 tablet (325 mg total) by mouth 2 (two) times daily.   atorvastatin 20 MG tablet Commonly known as:  LIPITOR Take 20 mg by mouth daily.   docusate sodium 100 MG capsule Commonly known as:  COLACE Take 100 mg by mouth at bedtime.   ENDOCET 10-325 MG tablet Generic drug:  oxyCODONE-acetaminophen Take 1 tablet by mouth every 4 (four) hours as needed. pain   gabapentin 300 MG capsule Commonly known as:  NEURONTIN Take 300 mg by mouth 3 (three) times daily.   indomethacin 50 MG capsule Commonly known as:  INDOCIN Take 50 mg by mouth 2 (two) times daily with a meal.   lisinopril-hydrochlorothiazide 20-12.5 MG tablet Commonly known as:  PRINZIDE,ZESTORETIC Take 1 tablet by mouth daily.   methocarbamol 500 MG tablet Commonly known as:  ROBAXIN Take 1 tablet (500 mg total) by mouth 2 (two) times daily with a meal.   multivitamin with minerals Tabs tablet Take 1 tablet by mouth daily.   polyethylene glycol packet Commonly known as:  MIRALAX / GLYCOLAX Take 17 g by mouth daily.   ranitidine 150 MG tablet Commonly known as:  ZANTAC Take 150 mg by mouth at bedtime.   traMADol 50 MG tablet Commonly known as:  ULTRAM Take 100 mg by mouth every 8 (eight) hours as needed for moderate pain.            Durable Medical Equipment        Start     Ordered   06/15/16 1100  DME Walker rolling  Once    Question:  Patient needs a walker to treat with the following condition  Answer:   Primary localized osteoarthritis of left hip   06/15/16 1059   06/15/16 1100  DME 3 n 1  Once     06/15/16 1059   06/15/16 1100  DME Bedside commode  Once    Question:  Patient needs a bedside commode to treat with the following condition  Answer:  Primary localized osteoarthritis of left hip   06/15/16 1059      Diagnostic Studies: Dg Chest 2 View  Result Date: 06/05/2016 CLINICAL DATA:  Hypertension.  Preop for hip replacement. EXAM: CHEST  2 VIEW COMPARISON:  Radiographs of May 03, 2015. FINDINGS: The heart size and mediastinal contours are within normal limits. Both lungs are clear. No pneumothorax or pleural effusion is noted. Multilevel degenerative disc disease is noted in the mid thoracic spine. IMPRESSION: No active cardiopulmonary disease. Electronically Signed   By: Lupita Raider, M.D.   On: 06/05/2016 11:40   Dg C-arm 1-60 Min  Result Date: 06/15/2016 CLINICAL DATA:  60 year old female undergoing left hip arthroplasty. Initial encounter. EXAM: OPERATIVE LEFT HIP (WITH PELVIS IF PERFORMED) TWO VIEWS TECHNIQUE: Fluoroscopic spot image(s) were submitted for interpretation post-operatively. COMPARISON:  Pelvis radiograph 05/13/2015. FLUOROSCOPY TIME:  0 minutes 11 seconds FINDINGS: Preexisting right hip arthroplasty. Intraoperative fluoroscopic AP images of the left hip and lower pelvis demonstrate new left hip arthroplasty hardware, bipolar type. Normal AP alignment. The hardware appears intact. No unexpected osseous changes. IMPRESSION: New left hip arthroplasty with no adverse features. Electronically Signed   By: Odessa Fleming M.D.   On: 06/15/2016 08:54   Dg Hip Operative Unilat W Or W/o Pelvis Left  Result Date: 06/15/2016 CLINICAL DATA:  60 year old female undergoing left hip arthroplasty. Initial encounter. EXAM: OPERATIVE LEFT HIP (WITH PELVIS IF PERFORMED) TWO VIEWS TECHNIQUE: Fluoroscopic spot image(s) were submitted for interpretation post-operatively. COMPARISON:  Pelvis  radiograph 05/13/2015. FLUOROSCOPY TIME:  0 minutes 11 seconds FINDINGS: Preexisting right hip arthroplasty. Intraoperative fluoroscopic AP images of the left hip and lower pelvis demonstrate new left hip arthroplasty hardware, bipolar type. Normal AP alignment. The hardware appears intact. No unexpected osseous changes. IMPRESSION: New left hip arthroplasty with no adverse features. Electronically Signed   By: Odessa Fleming M.D.   On: 06/15/2016 08:54    Disposition: 01-Home or Self Care  Discharge Instructions    Call MD / Call 911    Complete by:  As directed    If you experience chest pain or shortness of breath, CALL 911 and be transported to the hospital emergency room.  If you develope a fever above  101 F, pus (white drainage) or increased drainage or redness at the wound, or calf pain, call your surgeon's office.   Constipation Prevention    Complete by:  As directed    Drink plenty of fluids.  Prune juice may be helpful.  You may use a stool softener, such as Colace (over the counter) 100 mg twice a day.  Use MiraLax (over the counter) for constipation as needed.   Diet - low sodium heart healthy    Complete by:  As directed    Driving restrictions    Complete by:  As directed    No driving for 2 weeks   Follow the hip precautions as taught in Physical Therapy    Complete by:  As directed    Increase activity slowly as tolerated    Complete by:  As directed    Patient may shower    Complete by:  As directed    You may shower without a dressing once there is no drainage.  Do not wash over the wound.  If drainage remains, cover wound with plastic wrap and then shower.      Follow-up Information    Nestor LewandowskyOWAN,FRANK J, MD Follow up in 2 week(s).   Specialty:  Orthopedic Surgery Contact information: [address] [place] [address] [phone]        [facility] Follow up.   Why:  [facility] will contact you to arrange start date and  time for therapy. Contact information: [address] [place] [phone]            Signed: Vear ClockHILLIPS,  R 06/17/2016, 7:48 AM

## 2016-06-17 NOTE — Progress Notes (Signed)
Physical Therapy Treatment Patient Details Name: Kathy Coleman MRN: [id number] DOB: 13-Mar-1956 Today's Date: 06/17/2016    History of Present Illness Pt is a 60 y.o. female now s/p direct anterior THA. PMH: HTN, Rt TKA, Rt THA.   Pt with ABL anemia requring 1 unit of PRBCs post-op.      PT Comments    Patient is making good progress with PT.  From a mobility standpoint anticipate patient will be ready for DC home when medically ready.     Follow Up Recommendations  Home health PT;Supervision for mobility/OOB     Equipment Recommendations  None recommended by PT    Recommendations for Other Services       Precautions / Restrictions Precautions Precautions: None Precaution Comments: HEP provided Restrictions Weight Bearing Restrictions: Yes LLE Weight Bearing: Weight bearing as tolerated    Mobility  Bed Mobility Overal bed mobility: Modified Independent Bed Mobility: Supine to Sit           General bed mobility comments: increased time  Transfers Overall transfer level: Modified independent Equipment used: Rolling walker (2 wheeled) Transfers: Sit to/from Stand           General transfer comment: carry over of safe hand placement and technique  Ambulation/Gait Ambulation/Gait assistance: Supervision Ambulation Distance (Feet): 250 Feet Assistive device: Rolling walker (2 wheeled) Gait Pattern/deviations: Step-through pattern;Antalgic     General Gait Details: pt with little reliance on UE support and demonstrated symmetrical step lengths   Stairs            Wheelchair Mobility    Modified Rankin (Stroke Patients Only)       Balance Overall balance assessment: Needs assistance Sitting-balance support: Feet supported;No upper extremity supported Sitting balance-Leahy Scale: Good       Standing balance-Leahy Scale: Fair                      Cognition Arousal/Alertness: Awake/alert Behavior During Therapy: WFL for tasks  assessed/performed Overall Cognitive Status: Within Functional Limits for tasks assessed                      Exercises Total Joint Exercises Hip ABduction/ADduction: AROM;Left;15 reps;Standing Long Arc Quad: AROM;Left;15 reps;Seated Knee Flexion: AROM;Left;15 reps;Standing Marching in Standing: AROM;Left;15 reps;Standing    General Comments General comments (skin integrity, edema, etc.): husband present      Pertinent Vitals/Pain Pain Assessment: Faces Faces Pain Scale: Hurts a little bit Pain Location: left hip and thigh Pain Descriptors / Indicators: Tightness;Sore Pain Intervention(s): Limited activity within patient's tolerance;Monitored during session;Premedicated before session;Repositioned    Home Living                      Prior Function            PT Goals (current goals can now be found in the care plan section) Acute Rehab PT Goals Patient Stated Goal: go home Progress towards PT goals: Progressing toward goals    Frequency    7X/week      PT Plan Current plan remains appropriate    Co-evaluation             End of Session Equipment Utilized During Treatment: Gait belt Activity Tolerance: Patient tolerated treatment well Patient left: in chair;with chair alarm set;with family/visitor present     Time: [id number] PT Time Calculation (min) (ACUTE ONLY): 12 min  Charges:  $Gait Training: 8-22 mins  G Codes:      Derek Mound R    , PTA Pager: [phone]   06/17/2016, 9:07 AM

## 2016-06-17 NOTE — Plan of Care (Signed)
Problem: Pain Management: Goal: Pain level will decrease with appropriate interventions Outcome: Progressing Medicated once for pain with full relief

## 2016-06-17 NOTE — Progress Notes (Signed)
PATIENT ID: Kathy Coleman  MRN: [id number]  DOB/AGE:  11/26/1955 / 60 y.o.  2 Days Post-Op Procedure(s) (LRB): TOTAL HIP ARTHROPLASTY ANTERIOR APPROACH (Left)    PROGRESS NOTE Subjective: Patient is alert, oriented, no Nausea, no Vomiting, yes passing gas, . Taking PO well. Denies SOB, Chest or Calf Pain. Using Incentive Spirometer, PAS in place. Ambulate WBAT with pt walking 510 ft withtherapy Patient reports pain as  mild .    Objective: Vital signs in last 24 hours: Vitals:   06/16/16 1300 06/16/16 1335 06/16/16 2217 06/17/16 0443  BP: (!) 95/42 (!) 91/47 (!) 113/53 (!) 104/51  Pulse: 90 86 83 80  Resp: 16 18 18 16   Temp: 97.8 F (36.6 C) 98.7 F (37.1 C) 98.7 F (37.1 C) 98.5 F (36.9 C)  TempSrc: Oral Oral Oral Oral  SpO2: 100% 100% 100% 100%      Intake/Output from previous day: I/O last 3 completed shifts: In: 3919.3 [P.O.:720; I.V.:2433.3; Blood:766] Out: 4 [Urine:4]   Intake/Output this shift: No intake/output data recorded.   LABORATORY DATA:  Recent Labs  06/16/16 0428 06/17/16 0359  WBC 6.0 6.5  HGB 7.3* 8.1*  HCT 21.4* 24.0*  PLT 168 176  NA 129*  --   K 3.9  --   CL 98*  --   CO2 25  --   BUN 16  --   CREATININE 1.07*  --   GLUCOSE 102*  --   CALCIUM 7.9*  --     Examination: Neurologically intact Neurovascular intact Sensation intact distally Intact pulses distally Dorsiflexion/Plantar flexion intact Incision: dressing C/D/I No cellulitis present Compartment soft} XR AP&Lat of hip shows well placed\fixed THA  Assessment:   2 Days Post-Op Procedure(s) (LRB): TOTAL HIP ARTHROPLASTY ANTERIOR APPROACH (Left) ADDITIONAL DIAGNOSIS:  Expected Acute Blood Loss Anemia,   Plan: PT/OT WBAT, THA  DVT Prophylaxis: SCDx72 hrs, ASA 325 mg BID x 2 weeks  DISCHARGE PLAN: Home  DISCHARGE NEEDS: HHPT, Walker and 3-in-1 comode seat

## 2016-06-17 NOTE — Progress Notes (Signed)
Discharge instructions and prescriptions given and reviewed with patient. Pt stated understanding. No IV access. Patient family is at bedside for transportation Industrial/product designerChristilla A , RCharity fundraiser

## 2016-08-07 ENCOUNTER — Other Ambulatory Visit: Payer: Self-pay | Admitting: Orthopedic Surgery

## 2016-08-16 NOTE — Pre-Procedure Instructions (Signed)
Teodora MediciMyra L Geier  08/16/2016      [facility] [place], [place] [phone] [address] [place] [id number] Phone: [phone] Fax: [phone]  [facility] [place], [place] - [address] [place] [id number] Phone: [phone] Fax: [phone]    Your procedure is scheduled on February 19  [facility] Admitting at [phone] A.M.  Call this number if you have problems the morning of surgery:  [phone]   Remember:  Do not eat food or drink liquids after midnight.   Take these medicines the morning of surgery with A SIP OF WATER gabapentin (NEURONTIN),  ranitidine (ZANTAC), traMADol (ULTRAM)   7 days prior to surgery STOP taking any indomethacin (INDOCIN),   Aspirin, Aleve, Naproxen, Ibuprofen, Motrin, Advil, Goody's, BC's, all herbal medications, fish oil, and all vitamins    Do not wear jewelry, make-up or nail polish.  Do not wear lotions, powders, or perfumes, or deoderant.  Do not shave 48 hours prior to surgery.    Do not bring valuables to the hospital.  [facility] is not responsible for any belongings or valuables.  Contacts, dentures or bridgework may not be worn into surgery.  Leave your suitcase in the car.  After surgery it may be brought to your room.  For patients admitted to the hospital, discharge time will be determined by your treatment team.  Patients discharged the day of surgery will not be allowed to drive home.    Special instructions:   [place]- Preparing For Surgery  Before surgery, you can play an important role. Because skin is not sterile, your skin needs to be as free of germs as possible. You can reduce the number of germs on your skin by washing with CHG (chlorahexidine gluconate) Soap before surgery.  CHG is an antiseptic cleaner which kills germs and bonds with the skin to continue killing germs even after washing.  Please do not  use if you have an allergy to CHG or antibacterial soaps. If your skin becomes reddened/irritated stop using the CHG.  Do not shave (including legs and underarms) for at least 48 hours prior to first CHG shower. It is OK to shave your face.  Please follow these instructions carefully.   1. Shower the NIGHT BEFORE SURGERY and the MORNING OF SURGERY with CHG.   2. If you chose to wash your hair, wash your hair first as usual with your normal shampoo.  3. After you shampoo, rinse your hair and body thoroughly to remove the shampoo.  4. Use CHG as you would any other liquid soap. You can apply CHG directly to the skin and wash gently with a scrungie or a clean washcloth.   5. Apply the CHG Soap to your body ONLY FROM THE NECK DOWN.  Do not use on open wounds or open sores. Avoid contact with your eyes, ears, mouth and genitals (private parts). Wash genitals (private parts) with your normal soap.  6. Wash thoroughly, paying special attention to the area where your surgery will be performed.  7. Thoroughly rinse your body with warm water from the neck down.  8. DO NOT shower/wash with your normal soap after using and rinsing off the CHG Soap.  9. Pat yourself dry with a CLEAN TOWEL.   10. Wear CLEAN PAJAMAS   11. Place CLEAN SHEETS on your bed the night of your first shower and DO  NOT SLEEP WITH PETS.    Day of Surgery: Do not apply any deodorants/lotions. Please wear clean clothes to the hospital/surgery center.      Please read over the following fact sheets that you were given.

## 2016-08-17 ENCOUNTER — Encounter (HOSPITAL_COMMUNITY)
Admission: RE | Admit: 2016-08-17 | Discharge: 2016-08-17 | Disposition: A | Discharge status: Home / Self Care | Payer: Medicare FFS | Source: Ambulatory Visit | Attending: Orthopedic Surgery | Admitting: Orthopedic Surgery

## 2016-08-17 ENCOUNTER — Encounter (HOSPITAL_COMMUNITY): Payer: Self-pay

## 2016-08-17 DIAGNOSIS — Z01812 Encounter for preprocedural laboratory examination: Secondary | ICD-10-CM | POA: Diagnosis not present

## 2016-08-17 LAB — CBC WITH DIFFERENTIAL/PLATELET
BASOS ABS: 0 10*3/uL (ref 0.0–0.1)
BASOS PCT: 1 %
EOS ABS: 0.1 10*3/uL (ref 0.0–0.7)
Eosinophils Relative: 3 %
HEMATOCRIT: 34 % — AB (ref 36.0–46.0)
Hemoglobin: 10.7 g/dL — ABNORMAL LOW (ref 12.0–15.0)
Lymphocytes Relative: 27 %
Lymphs Abs: 1.1 10*3/uL (ref 0.7–4.0)
MCH: 27.6 pg (ref 26.0–34.0)
MCHC: 31.5 g/dL (ref 30.0–36.0)
MCV: 87.9 fL (ref 78.0–100.0)
MONO ABS: 0.3 10*3/uL (ref 0.1–1.0)
Monocytes Relative: 7 %
Neutro Abs: 2.7 10*3/uL (ref 1.7–7.7)
Neutrophils Relative %: 62 %
PLATELETS: 227 10*3/uL (ref 150–400)
RBC: 3.87 MIL/uL (ref 3.87–5.11)
RDW: 13.8 % (ref 11.5–15.5)
WBC: 4.2 10*3/uL (ref 4.0–10.5)

## 2016-08-17 LAB — URINALYSIS, ROUTINE W REFLEX MICROSCOPIC
Bilirubin Urine: NEGATIVE
GLUCOSE, UA: NEGATIVE mg/dL
Hgb urine dipstick: NEGATIVE
Ketones, ur: NEGATIVE mg/dL
Nitrite: NEGATIVE
PH: 5 (ref 5.0–8.0)
Protein, ur: NEGATIVE mg/dL
SPECIFIC GRAVITY, URINE: 1.01 (ref 1.005–1.030)

## 2016-08-17 LAB — BASIC METABOLIC PANEL
ANION GAP: 6 (ref 5–15)
BUN: 31 mg/dL — ABNORMAL HIGH (ref 6–20)
CALCIUM: 9.1 mg/dL (ref 8.9–10.3)
CO2: 29 mmol/L (ref 22–32)
CREATININE: 1.1 mg/dL — AB (ref 0.44–1.00)
Chloride: 103 mmol/L (ref 101–111)
GFR, EST NON AFRICAN AMERICAN: 53 mL/min — AB (ref >60)
GLUCOSE: 98 mg/dL (ref 65–99)
Potassium: 4.6 mmol/L (ref 3.5–5.1)
Sodium: 138 mmol/L (ref 135–145)

## 2016-08-17 LAB — SURGICAL PCR SCREEN
MRSA, PCR: NEGATIVE
STAPHYLOCOCCUS AUREUS: NEGATIVE

## 2016-08-17 LAB — PROTIME-INR
INR: 0.94
PROTHROMBIN TIME: 12.6 s (ref 11.4–15.2)

## 2016-08-17 LAB — APTT: APTT: 32 s (ref 24–36)

## 2016-08-17 NOTE — Progress Notes (Addendum)
PCP - Timoteo Aceavid Trapper Cardiologist - denies  Chest x-ray - denies EKG - 06/05/16 Stress Test - denies ECHO - denies Cardiac Cath - denies Sleep Study - denies  Will send to anesthesia for review of the EKG see Revonda Standardllison Note 06/05/16   Patient denies shortness of breath, fever, cough and chest pain at PAT appointment   Patient verbalized understanding of instructions that was given to them at the PAT appointment. Patient expressed that there were no further questions.  Patient was also instructed that they will need to review over the PAT instructions again at home before the surgery.

## 2016-08-18 ENCOUNTER — Encounter (HOSPITAL_COMMUNITY): Payer: Self-pay | Admitting: Vascular Surgery

## 2016-08-18 NOTE — Progress Notes (Signed)
Anesthesia Chart Review: Patient is a 61 year old female scheduled for left TKA on 08/24/16 by Dr. Turner Danielsowan.  History includes former smoker, post-operative N/V, HTN, GERD, arthritis, nephrolithiasis, dysphagia, hysterectomy, cholecystectomy, neck surgery, L2-4 PLIF 07/1814, right THA 05/13/15, right TKA 06/24/15, left THA 06/15/16.    PCP is listed as Dr. Wyvonnia Loraavid Tapper. [place], [place].  Meds include Lipitor, Endocet, Neurontin, lisinopril-HCTZ, Zantac, tramadol.  BP (!) 163/79   Pulse 60   Temp 36.7 C   Resp 20   Ht 5\' 5"  (1.651 m)   Wt 170 lb (77.1 kg)   SpO2 100%   BMI 28.29 kg/m   EKG 06/05/16: SR with first degree AV block, inferior infarct (age undetermined). The interpreting cardiologist did not think there was significant change when compared to 07/13/14 tracing.  CXR 06/05/16: IMPRESSION: No active cardiopulmonary disease.  Preoperative labs noted. Cr 1.10, glucose 98. H/H 10.7/34.0 (up from post-THA H/H). PT/PTT WNL. T&S needs to be redrawn on the day of surgery. UA showed small leukocytes, negative nitrites.  EKG appears stable. She tolerated recent hip replacement. If no acute changes then I anticipate that she can proceed as planned.  Velna Ochsllison , PA-C [facility]iology Phone [phone] 08/18/2016 5:03 PM

## 2016-08-19 NOTE — H&P (Signed)
TOTAL KNEE ADMISSION H&P  Patient is being admitted for left total knee arthroplasty.  Subjective:  Chief Complaint:left knee pain.  HPI: Kathy Coleman, 61 y.o. female, has a history of pain and functional disability in the left knee due to arthritis and has failed non-surgical conservative treatments for greater than 12 weeks to includeNSAID's and/or analgesics, corticosteriod injections, flexibility and strengthening excercises, use of assistive devices, weight reduction as appropriate and activity modification.  Onset of symptoms was gradual, starting 5 years ago with gradually worsening course since that time. The patient noted no past surgery on the left knee(s).  Patient currently rates pain in the left knee(s) at 10 out of 10 with activity. Patient has night pain, worsening of pain with activity and weight bearing, pain that interferes with activities of daily living, pain with passive range of motion, crepitus and joint swelling.  Patient has evidence of joint space narrowing by imaging studies.  There is no active infection.  Patient Active Problem List   Diagnosis Date Noted  . Primary localized osteoarthritis of left hip 06/15/2016  . Primary osteoarthritis of left hip 06/12/2016  . Dysphagia 05/15/2016  . Arthritis of knee 06/24/2015  . Primary osteoarthritis of right knee 06/22/2015  . Primary osteoarthritis of right hip 05/11/2015  . Postoperative anemia due to acute blood loss 07/24/2014  . Lumbar radiculopathy 07/23/2014   Past Medical History:  Diagnosis Date  . Arthritis   . Dysphagia   . GERD (gastroesophageal reflux disease)   . History of blood transfusion   . History of kidney stones   . Hypertension   . Osteoarthritis    left hip  . PONV (postoperative nausea and vomiting)    in the past did not have with last surgery in december  . Wears glasses     Past Surgical History:  Procedure Laterality Date  . ABDOMINAL HYSTERECTOMY    . BACK SURGERY     1998  ,  2010  LOW BACK    . BIOPSY  02/13/2016   Procedure: BIOPSY;  Surgeon: Corbin Ade, MD;  [facility];  Service: Endoscopy;;  Gastric and Esophageal biopsies  . CHOLECYSTECTOMY    . COLONOSCOPY  03/24/2006   Dr.Rourk- a couple of anal papillae, o/w normal rectum with L sided diverticula, the remainder of the colonic mucosa appeared normal.   . COLONOSCOPY N/A 02/13/2016   Dr. Jena Gauss: diverticulosis, melanosis coli   . ESOPHAGOGASTRODUODENOSCOPY  03/24/2006   Dr.Rourk- normal esophagus. a couple of antral erosions, o/w normal stomach, patent pylorus, normal D1,D2  . ESOPHAGOGASTRODUODENOSCOPY N/A 02/13/2016   Dr. Jena Gauss: erythematous mucosa, normal D2, empiric dilation   . FOOT SURGERY     RIGHT  2014  . MALONEY DILATION N/A 02/13/2016   Procedure: Elease Hashimoto DILATION;  Surgeon: Corbin Ade, MD;  [facility];  Service: Endoscopy;  Laterality: N/A;  . NECK SURGERY     2005, 2010  . TOTAL HIP ARTHROPLASTY Right 05/13/2015   Procedure: TOTAL HIP ARTHROPLASTY ANTERIOR APPROACH;  Surgeon: Gean Birchwood, MD;  Location: [facility];  Service: Orthopedics;  Laterality: Right;  . TOTAL HIP ARTHROPLASTY Left 06/15/2016   Procedure: TOTAL HIP ARTHROPLASTY ANTERIOR APPROACH;  Surgeon: Gean Birchwood, MD;  Location: [facility];  Service: Orthopedics;  Laterality: Left;  . TOTAL KNEE ARTHROPLASTY Right 06/24/2015   Procedure: TOTAL KNEE ARTHROPLASTY;  Surgeon: Gean Birchwood, MD;  Location: [facility];  Service: Orthopedics;  Laterality: Right;    No prescriptions prior to  admission.   Allergies  Allergen Reactions  . Decadron [Dexamethasone] Other (See Comments)    Severe burning in perineal area  . No Known Allergies     Social History  Substance Use Topics  . Smoking status: Former Games developermoker  . Smokeless tobacco: Never Used     Comment: Quit x 30 years  . Alcohol use No    Family History  Problem Relation Age of Onset  . Hypertension Mother   . COPD Mother   . Diabetes Mother   . Heart disease  Father   . Diabetes Father   . Hypertension Father      Review of Systems  Constitutional: Negative.   HENT: Negative.   Eyes: Negative.   Respiratory: Negative.   Cardiovascular:       Htn  Gastrointestinal: Negative.   Genitourinary:       Kidney stones  Musculoskeletal: Positive for joint pain.  Skin: Negative.   Neurological: Negative.   Endo/Heme/Allergies: Bruises/bleeds easily.  Psychiatric/Behavioral: Negative.     Objective:  Physical Exam  Constitutional: She is oriented to person, place, and time. She appears well-developed and well-nourished.  HENT:  Head: Normocephalic and atraumatic.  Eyes: Pupils are equal, round, and reactive to light.  Neck: Normal range of motion. Neck supple.  Cardiovascular: Intact distal pulses.   Respiratory: Effort normal.  Musculoskeletal: She exhibits tenderness.  Neurological: She is alert and oriented to person, place, and time.  Skin: Skin is warm and dry.  Psychiatric: She has a normal mood and affect. Her behavior is normal. Judgment and thought content normal.    Vital signs in last 24 hours:    Labs:   Estimated body mass index is 28.29 kg/m as calculated from the following:   Height as of 08/17/16: 5\' 5"  (1.651 m).   Weight as of 08/17/16: 77.1 kg (170 lb).   Imaging Review Plain radiographs demonstrate  bilateral AP weight-bearing and a lateral view of the right knee are taken and reviewed in office today.  Patient's right total knee arthroplasty also appears to be well-placed and well fixed. Pt does have moderate to severe arthritis of the left knee.  Assessment/Plan:  End stage arthritis, left knee   The patient history, physical examination, clinical judgment of the provider and imaging studies are consistent with end stage degenerative joint disease of the left knee(s) and total knee arthroplasty is deemed medically necessary. The treatment options including medical management, injection therapy arthroscopy  and arthroplasty were discussed at length. The risks and benefits of total knee arthroplasty were presented and reviewed. The risks due to aseptic loosening, infection, stiffness, patella tracking problems, thromboembolic complications and other imponderables were discussed. The patient acknowledged the explanation, agreed to proceed with the plan and consent was signed. Patient is being admitted for inpatient treatment for surgery, pain control, PT, OT, prophylactic antibiotics, VTE prophylaxis, progressive ambulation and ADL's and discharge planning. The patient is planning to be discharged home with home health services.

## 2016-08-24 ENCOUNTER — Inpatient Hospital Stay (HOSPITAL_COMMUNITY): Admission: RE | Admit: 2016-08-24 | Payer: Medicare FFS | Source: Ambulatory Visit | Admitting: Orthopedic Surgery

## 2016-08-24 ENCOUNTER — Encounter (HOSPITAL_COMMUNITY): Admission: RE | Payer: Self-pay | Source: Ambulatory Visit

## 2016-08-24 SURGERY — ARTHROPLASTY, KNEE, TOTAL
Anesthesia: Spinal | Laterality: Left

## 2016-09-21 ENCOUNTER — Other Ambulatory Visit: Payer: Self-pay | Admitting: Neurological Surgery

## 2016-09-28 ENCOUNTER — Encounter (HOSPITAL_COMMUNITY): Payer: Self-pay

## 2016-09-28 ENCOUNTER — Encounter (HOSPITAL_COMMUNITY)
Admission: RE | Admit: 2016-09-28 | Discharge: 2016-09-28 | Disposition: A | Discharge status: Home / Self Care | Payer: Medicare FFS | Source: Ambulatory Visit | Attending: Neurological Surgery | Admitting: Neurological Surgery

## 2016-09-28 DIAGNOSIS — Z01812 Encounter for preprocedural laboratory examination: Secondary | ICD-10-CM | POA: Insufficient documentation

## 2016-09-28 HISTORY — DX: Hyperlipidemia, unspecified: E78.5

## 2016-09-28 HISTORY — DX: Constipation, unspecified: K59.00

## 2016-09-28 LAB — CBC
HEMATOCRIT: 35.7 % — AB (ref 36.0–46.0)
Hemoglobin: 11.4 g/dL — ABNORMAL LOW (ref 12.0–15.0)
MCH: 27.7 pg (ref 26.0–34.0)
MCHC: 31.9 g/dL (ref 30.0–36.0)
MCV: 86.9 fL (ref 78.0–100.0)
PLATELETS: 210 10*3/uL (ref 150–400)
RBC: 4.11 MIL/uL (ref 3.87–5.11)
RDW: 15.1 % (ref 11.5–15.5)
WBC: 4.5 10*3/uL (ref 4.0–10.5)

## 2016-09-28 LAB — BASIC METABOLIC PANEL
Anion gap: 6 (ref 5–15)
BUN: 13 mg/dL (ref 6–20)
CO2: 29 mmol/L (ref 22–32)
CREATININE: 0.89 mg/dL (ref 0.44–1.00)
Calcium: 9.5 mg/dL (ref 8.9–10.3)
Chloride: 105 mmol/L (ref 101–111)
GFR calc Af Amer: >60 mL/min (ref >60)
GFR calc non Af Amer: >60 mL/min (ref >60)
GLUCOSE: 97 mg/dL (ref 65–99)
POTASSIUM: 5.3 mmol/L — AB (ref 3.5–5.1)
SODIUM: 140 mmol/L (ref 135–145)

## 2016-09-28 LAB — SURGICAL PCR SCREEN
MRSA, PCR: NEGATIVE
STAPHYLOCOCCUS AUREUS: NEGATIVE

## 2016-09-28 MED ORDER — CHLORHEXIDINE GLUCONATE CLOTH 2 % EX PADS: 6.0000 | MEDICATED_PAD | Freq: Once | CUTANEOUS | Status: DC

## 2016-09-28 NOTE — Pre-Procedure Instructions (Signed)
Kathy MediciMyra L Coleman  09/28/2016      [facility] [place], [place] - [id number] [place] [id number] Phone: [phone] Fax: [phone]  [facility] - [place], [place] - [address] [place] [id number] Phone: [phone] Fax: [phone]    Your procedure is scheduled on Mon, April 2 @ 7:30 AM  [facility] Admitting at 5:30 AM  Call this number if you have problems the morning of surgery:  [phone]   Remember:  Do not eat food or drink liquids after midnight.  Take these medicines the morning of surgery with A SIP OF WATER Pain Pill(if needed) and Gabapentin(Neurontin)              Stop taking your Indomethacin now. No Goody's,BC's,Aleve,Advil,Motrin,Ibuprofen,Fish Oil,or any Herbal Medications.    Do not wear jewelry, make-up or nail polish.  Do not wear lotions, powders,perfumes, or deoderant.  Do not shave 48 hours prior to surgery.    Do not bring valuables to the hospital.  [facility] is not responsible for any belongings or valuables.  Contacts, dentures or bridgework may not be worn into surgery.  Leave your suitcase in the car.  After surgery it may be brought to your room.  For patients admitted to the hospital, discharge time will be determined by your treatment team.  Patients discharged the day of surgery will not be allowed to drive home.    [facility]  Before surgery, you can play an important role.  Because skin is not sterile, your skin needs to be as free of germs as possible.  You can reduce the number of germs on you skin by washing with CHG (chlorahexidine gluconate) soap before surgery.  CHG is an antiseptic cleaner which kills germs and bonds with the skin to continue killing germs even after washing.  Please DO NOT use if you have an allergy to CHG or antibacterial soaps.  If your skin  becomes reddened/irritated stop using the CHG and inform your nurse when you arrive at Short Stay.  Do not shave (including legs and underarms) for at least 48 hours prior to the first CHG shower.  You may shave your face.  Please follow these instructions carefully:   1.  Shower with CHG Soap the night before surgery and the                                morning of Surgery.  2.  If you choose to wash your hair, wash your hair first as usual with your       normal shampoo.  3.  After you shampoo, rinse your hair and body thoroughly to remove the                      Shampoo.  4.  Use CHG as you would any other liquid soap.  You can apply chg directly       to the skin and wash gently with scrungie or a clean washcloth.  5.  Apply the CHG Soap to your body ONLY FROM THE NECK DOWN.        Do not use on open wounds or open sores.  Avoid contact with your eyes,       ears, mouth and genitals (private parts).  Wash genitals (private parts)       with your normal soap.  6.  Wash thoroughly, paying special attention to the area where your surgery        will be performed.  7.  Thoroughly rinse your body with warm water from the neck down.  8.  DO NOT shower/wash with your normal soap after using and rinsing off       the CHG Soap.  9.  Pat yourself dry with a clean towel.            10.  Wear clean pajamas.            11.  Place clean sheets on your bed the night of your first shower and do not        sleep with pets.  Day of Surgery  Do not apply any lotions/deoderants the morning of surgery.  Please wear clean clothes to the hospital/surgery center.    Please read over the following fact sheets that you were given. Pain Booklet, Coughing and Deep Breathing, MRSA Information and Surgical Site Infection Prevention

## 2016-09-28 NOTE — Progress Notes (Signed)
Will need T&S DOS d/t trasfusion in Dec 2017

## 2016-09-28 NOTE — Progress Notes (Addendum)
Cardiologist denies  Medical Md is Dr.[place]apper in [place]  Echo denies  Stress test denies  Heart cath denies  EKG in epic from 06-05-16

## 2016-09-29 NOTE — Progress Notes (Signed)
Anesthesia Chart Review: Patient is a 61 year old female scheduled for L1-2 PLIF on 10/05/16 by Dr. Danielle DessElsner. I had initially reviewed her chart for left TKA on 08/24/16, but it appears this surgery was postponed.   History includes former smoker, post-operative N/V, HTN, GERD, arthritis, nephrolithiasis, dysphagia, hysterectomy, cholecystectomy, neck surgery, L2-4 PLIF 07/1814, right THA 05/13/15, right TKA 06/24/15, left THA 06/15/16.   PCP is listed as Dr. Wyvonnia Loraavid Tapper. [place] lives in [place], [place].  Meds include Lipitor, Neurontin, Indocin, lisinopril, Percocet, Zantac, tramadol.  BP (!) 141/65   Pulse (!) 59   Temp 36.8 C   Resp 20   Ht 5\' 5"  (1.651 m)   Wt 165 lb 8 oz (75.1 kg)   SpO2 99%   BMI 27.54 kg/m   EKG 06/05/16: SR with first degree AV block, inferior infarct (age undetermined). The interpreting cardiologist did not think there was significant change when compared to 07/13/14 tracing.  CXR 06/05/16: IMPRESSION: No active cardiopulmonary disease.  Preoperative labs noted. Cr 0.89, glucose 97. H/H 11.4/35.7 (up from post-THA H/H). T&S to be drawn the day of surgery (due to transfusion 06/2016).   EKG appears stable. She tolerated recent hip replacement. If no acute changes then I anticipate that she can proceed as planned.  Velna Ochsllison Zelenak, PA-C [facility]iology Phone [phone] 09/29/2016 5:46 PM

## 2016-10-04 NOTE — Anesthesia Preprocedure Evaluation (Addendum)
Anesthesia Evaluation   Patient awake    Reviewed: Allergy & Precautions, NPO status , Patient's Chart, lab work & pertinent test results  History of Anesthesia Complications (+) PONV and history of anesthetic complications  Airway Mallampati: II  TM Distance: >3 FB Neck ROM: Full    Dental  (+) Teeth Intact, Dental Advisory Given   Pulmonary former smoker,    breath sounds clear to auscultation       Cardiovascular hypertension,  Rhythm:Regular Rate:Normal     Neuro/Psych  Neuromuscular disease    GI/Hepatic GERD  Medicated and Controlled,  Endo/Other    Renal/GU      Musculoskeletal  (+) Arthritis ,   Abdominal   Peds  Hematology  (+) anemia ,   Anesthesia Other Findings   Reproductive/Obstetrics                          Anesthesia Physical Anesthesia Plan  ASA: II  Anesthesia Plan: General   Post-op Pain Management:    Induction: Intravenous  Airway Management Planned: Oral ETT  Additional Equipment: Arterial line  Intra-op Plan:   Post-operative Plan: Extubation in OR  Informed Consent: I have reviewed the patients History and Physical, chart, labs and discussed the procedure including the risks, benefits and alternatives for the proposed anesthesia with the patient or authorized representative who has indicated his/her understanding and acceptance.   Dental advisory given  Plan Discussed with: CRNA and Anesthesiologist  Anesthesia Plan Comments: (Patient had Total hip replacement in 2017. No changes since then including EKG, See Hannah Beat PA Anesth notes.)      Anesthesia Quick Evaluation

## 2016-10-04 NOTE — H&P (Addendum)
Kathy Coleman is an 61 y.o. female.   Chief Complaint: Back and left lower ext pain HPI: Kathy Coleman  has had with left buttock and low back pain that has been getting considerably worse.  She notes that this has been going on for a couple of months and recently she had an MRI performed.  This study is reviewed  and demonstrates that Kathy Coleman has degenerated the disc at the L1-L2 level and has a large herniation of the disc centrally into the right side.  She also has retrolisthesis of L1 on L2 and the disc space is nearly completely gone having ruptured the disc into the central canal.  This causes severe stenosis of the lumbar spine.  Clinically, she is not having any right-sided symptoms, but notes her pain is discretely on the left side from the left buttock down to the left leg.  Past Medical History:  Diagnosis Date  . Arthritis   . Constipation    takes Miralax nightly  . GERD (gastroesophageal reflux disease)    takes Zantac daily  . History of blood transfusion   . History of kidney stones   . Hyperlipidemia    takes Atorvastatin daily  . Hypertension    takes Lisinopril daily  . Osteoarthritis    left hip  . PONV (postoperative nausea and vomiting)    in the past did not have with last surgery in december    Past Surgical History:  Procedure Laterality Date  . ABDOMINAL HYSTERECTOMY    . BACK SURGERY     1998  , 2010  LOW BACK    . BIOPSY  02/13/2016   Procedure: BIOPSY;  Surgeon: Corbin Ade, MD;  [facility];  Service: Endoscopy;;  Gastric and Esophageal biopsies  . CHOLECYSTECTOMY    . COLONOSCOPY  03/24/2006   Dr.Rourk- a couple of anal papillae, o/w normal rectum with L sided diverticula, the remainder of the colonic mucosa appeared normal.   . COLONOSCOPY N/A 02/13/2016   Dr. Jena Gauss: diverticulosis, melanosis coli   . ESOPHAGOGASTRODUODENOSCOPY  03/24/2006   Dr.Rourk- normal esophagus. a couple of antral erosions, o/w normal stomach, patent pylorus, normal D1,D2  .  ESOPHAGOGASTRODUODENOSCOPY N/A 02/13/2016   Dr. Jena Gauss: erythematous mucosa, normal D2, empiric dilation   . FOOT SURGERY     RIGHT  2014  . MALONEY DILATION N/A 02/13/2016   Procedure: Elease Hashimoto DILATION;  Surgeon: Corbin Ade, MD;  [facility];  Service: Endoscopy;  Laterality: N/A;  . NECK SURGERY     2005, 2010  . TOTAL HIP ARTHROPLASTY Right 05/13/2015   Procedure: TOTAL HIP ARTHROPLASTY ANTERIOR APPROACH;  Surgeon: Gean Birchwood, MD;  [facility];  Service: Orthopedics;  Laterality: Right;  . TOTAL HIP ARTHROPLASTY Left 06/15/2016   Procedure: TOTAL HIP ARTHROPLASTY ANTERIOR APPROACH;  Surgeon: Gean Birchwood, MD;  [facility];  Service: Orthopedics;  Laterality: Left;  . TOTAL KNEE ARTHROPLASTY Right 06/24/2015   Procedure: TOTAL KNEE ARTHROPLASTY;  Surgeon: Gean Birchwood, MD;  [facility];  Service: Orthopedics;  Laterality: Right;    Family History  Problem Relation Age of Onset  . Hypertension Mother   . COPD Mother   . Diabetes Mother   . Heart disease Father   . Diabetes Father   . Hypertension Father    Social History:  reports that she has quit smoking. She has never used smokeless tobacco. She reports that she does not drink alcohol or use drugs.  Allergies:  Allergies  Allergen Reactions  . Decadron [Dexamethasone] Other (See Comments)    Severe burning in perineal area  . No Known Allergies     No prescriptions prior to admission.    No results found for this or any previous visit (from the past 48 hour(s)). No results found.  Review of Systems  HENT: Negative.   Eyes: Negative.   Respiratory: Negative.   Cardiovascular: Negative.   Gastrointestinal: Negative.   Genitourinary: Negative.   Musculoskeletal: Positive for back pain.  Skin: Negative.   Neurological: Positive for sensory change, focal weakness and weakness.  Endo/Heme/Allergies: Negative.   Psychiatric/Behavioral: Negative.     There were no vitals taken for this  visit. Physical Exam  Constitutional: She appears well-developed and well-nourished.  HENT:  Head: Normocephalic and atraumatic.  Eyes: Conjunctivae and EOM are normal. Pupils are equal, round, and reactive to light.  Neck: Normal range of motion. Neck supple.  Cardiovascular: Normal rate and regular rhythm.   Respiratory: Effort normal and breath sounds normal.  GI: Soft. Bowel sounds are normal.  Musculoskeletal:  positve SLR bilat at 15 degrees patricks negative Moderate back tnederness.   Neurological:  Alert  Oriented,Cr Nvs intact.motor with mild weakness in IP andQuads bilaterally . abscent dtr in both le.     Assessment/Plan Having reviewed the MRI that demonstrates the central stenosis that is severe with compression of her cauda equina at this time, I advised that Kathy Coleman undergo surgical decompression and stabilization of the L1-L2 joint.  She has a fusion from L2 to the sacrum that appears solid and well healed on the x-rays and a CT scan obtained back in December of this past year.  She tells me that in December she had a hip replacement on that left side.  Nonetheless, this current disc herniation with the degree of stenosis is worrisome for threatening the function of her cauda equina, and I have suggested that she should undergo surgical decompression or arthrodesis at the earliest convenience.She is admitted for surgery.  Stefani Dama, MD 10/04/2016, 8:56 PM

## 2016-10-05 ENCOUNTER — Encounter (HOSPITAL_COMMUNITY): Payer: Self-pay | Admitting: *Deleted

## 2016-10-05 ENCOUNTER — Inpatient Hospital Stay (HOSPITAL_COMMUNITY): Payer: Medicare FFS | Admitting: Anesthesiology

## 2016-10-05 ENCOUNTER — Inpatient Hospital Stay (HOSPITAL_COMMUNITY): Payer: Medicare FFS | Admitting: Emergency Medicine

## 2016-10-05 ENCOUNTER — Inpatient Hospital Stay (HOSPITAL_COMMUNITY)
Admission: RE | Admit: 2016-10-05 | Discharge: 2016-10-07 | DRG: 455 | Disposition: A | Discharge status: Home / Self Care | Payer: Medicare FFS | Source: Ambulatory Visit | Attending: Neurological Surgery | Admitting: Neurological Surgery

## 2016-10-05 ENCOUNTER — Inpatient Hospital Stay (HOSPITAL_COMMUNITY): Payer: Medicare FFS

## 2016-10-05 ENCOUNTER — Inpatient Hospital Stay (HOSPITAL_COMMUNITY)
Admission: RE | Disposition: A | Discharge status: Home / Self Care | Payer: Self-pay | Source: Ambulatory Visit | Attending: Neurological Surgery

## 2016-10-05 DIAGNOSIS — Z833 Family history of diabetes mellitus: Secondary | ICD-10-CM | POA: Diagnosis not present

## 2016-10-05 DIAGNOSIS — Z87891 Personal history of nicotine dependence: Secondary | ICD-10-CM

## 2016-10-05 DIAGNOSIS — I10 Essential (primary) hypertension: Secondary | ICD-10-CM | POA: Diagnosis present

## 2016-10-05 DIAGNOSIS — Z8249 Family history of ischemic heart disease and other diseases of the circulatory system: Secondary | ICD-10-CM

## 2016-10-05 DIAGNOSIS — M5116 Intervertebral disc disorders with radiculopathy, lumbar region: Secondary | ICD-10-CM | POA: Diagnosis present

## 2016-10-05 DIAGNOSIS — Z96643 Presence of artificial hip joint, bilateral: Secondary | ICD-10-CM | POA: Diagnosis present

## 2016-10-05 DIAGNOSIS — M5126 Other intervertebral disc displacement, lumbar region: Secondary | ICD-10-CM | POA: Diagnosis present

## 2016-10-05 DIAGNOSIS — E785 Hyperlipidemia, unspecified: Secondary | ICD-10-CM | POA: Diagnosis present

## 2016-10-05 DIAGNOSIS — M4726 Other spondylosis with radiculopathy, lumbar region: Secondary | ICD-10-CM | POA: Diagnosis present

## 2016-10-05 DIAGNOSIS — Z90710 Acquired absence of both cervix and uterus: Secondary | ICD-10-CM | POA: Diagnosis not present

## 2016-10-05 DIAGNOSIS — M545 Low back pain: Secondary | ICD-10-CM | POA: Diagnosis present

## 2016-10-05 DIAGNOSIS — Z419 Encounter for procedure for purposes other than remedying health state, unspecified: Secondary | ICD-10-CM

## 2016-10-05 DIAGNOSIS — K219 Gastro-esophageal reflux disease without esophagitis: Secondary | ICD-10-CM | POA: Diagnosis present

## 2016-10-05 DIAGNOSIS — Z09 Encounter for follow-up examination after completed treatment for conditions other than malignant neoplasm: Secondary | ICD-10-CM

## 2016-10-05 LAB — TYPE AND SCREEN
ABO/RH(D): O POS
ANTIBODY SCREEN: NEGATIVE

## 2016-10-05 LAB — BASIC METABOLIC PANEL
Anion gap: 9 (ref 5–15)
BUN: 17 mg/dL (ref 6–20)
CHLORIDE: 105 mmol/L (ref 101–111)
CO2: 26 mmol/L (ref 22–32)
Calcium: 9.2 mg/dL (ref 8.9–10.3)
Creatinine, Ser: 0.84 mg/dL (ref 0.44–1.00)
GFR calc non Af Amer: >60 mL/min (ref >60)
GLUCOSE: 93 mg/dL (ref 65–99)
Potassium: 4.3 mmol/L (ref 3.5–5.1)
Sodium: 140 mmol/L (ref 135–145)

## 2016-10-05 SURGERY — POSTERIOR LUMBAR FUSION 1 LEVEL
Anesthesia: General | Site: Back

## 2016-10-05 MED ORDER — PROPOFOL 10 MG/ML IV BOLUS
INTRAVENOUS | Status: DC | PRN
  Administered 2016-10-05: 170 mg via INTRAVENOUS

## 2016-10-05 MED ORDER — MEPERIDINE HCL 25 MG/ML IJ SOLN
6.2500 mg | INTRAMUSCULAR | Status: DC | PRN
Start: 2016-10-05 — End: 2016-10-05

## 2016-10-05 MED ORDER — OXYCODONE-ACETAMINOPHEN 10-325 MG PO TABS: 1.0000 | ORAL_TABLET | ORAL | Status: DC | PRN

## 2016-10-05 MED ORDER — GABAPENTIN 300 MG PO CAPS
300.0000 mg | ORAL_CAPSULE | Freq: Three times a day (TID) | ORAL | Status: DC
  Administered 2016-10-05 – 2016-10-07 (×7): 300 mg via ORAL
  Filled 2016-10-05: qty 3
  Filled 2016-10-05 (×2): qty 1
  Filled 2016-10-05: qty 3
  Filled 2016-10-05 (×3): qty 1

## 2016-10-05 MED ORDER — ACETAMINOPHEN 325 MG PO TABS
650.0000 mg | ORAL_TABLET | ORAL | Status: DC | PRN
  Administered 2016-10-06: 650 mg via ORAL
  Filled 2016-10-05: qty 2

## 2016-10-05 MED ORDER — LIDOCAINE-EPINEPHRINE 2 %-1:100000 IJ SOLN
INTRAMUSCULAR | Status: DC | PRN
  Administered 2016-10-05: 5 mL via INTRADERMAL

## 2016-10-05 MED ORDER — ONDANSETRON HCL 4 MG/2ML IJ SOLN
INTRAMUSCULAR | Status: DC | PRN
Start: 2016-10-05 — End: 2016-10-05
  Administered 2016-10-05: 4 mg via INTRAVENOUS

## 2016-10-05 MED ORDER — OXYCODONE HCL 5 MG PO TABS
5.0000 mg | ORAL_TABLET | ORAL | Status: DC | PRN
  Administered 2016-10-05 – 2016-10-07 (×12): 5 mg via ORAL
  Filled 2016-10-05 (×12): qty 1

## 2016-10-05 MED ORDER — HYDROCODONE-ACETAMINOPHEN 5-325 MG PO TABS
1.0000 | ORAL_TABLET | ORAL | Status: DC | PRN
  Administered 2016-10-05 – 2016-10-07 (×2): 2 via ORAL
  Filled 2016-10-05: qty 2

## 2016-10-05 MED ORDER — HYDROMORPHONE HCL 1 MG/ML IJ SOLN
1.0000 mg | INTRAMUSCULAR | Status: DC | PRN
  Administered 2016-10-05 (×4): 1 mg via INTRAVENOUS
  Filled 2016-10-05 (×4): qty 1

## 2016-10-05 MED ORDER — FLEET ENEMA 7-19 GM/118ML RE ENEM: 1.0000 | ENEMA | Freq: Once | RECTAL | Status: DC | PRN

## 2016-10-05 MED ORDER — FENTANYL CITRATE (PF) 250 MCG/5ML IJ SOLN
INTRAMUSCULAR | Status: AC
  Filled 2016-10-05: qty 5

## 2016-10-05 MED ORDER — LACTATED RINGERS IV SOLN
INTRAVENOUS | Status: DC | PRN
  Administered 2016-10-05 (×2): via INTRAVENOUS

## 2016-10-05 MED ORDER — SODIUM CHLORIDE 0.9 % IV SOLN: INTRAVENOUS | Status: DC

## 2016-10-05 MED ORDER — SCOPOLAMINE 1 MG/3DAYS TD PT72
1.0000 | MEDICATED_PATCH | TRANSDERMAL | Status: DC
  Administered 2016-10-05: 1.5 mg via TRANSDERMAL
  Filled 2016-10-05: qty 1

## 2016-10-05 MED ORDER — DEXAMETHASONE SODIUM PHOSPHATE 10 MG/ML IJ SOLN
INTRAMUSCULAR | Status: AC
  Filled 2016-10-05: qty 1

## 2016-10-05 MED ORDER — ROCURONIUM BROMIDE 10 MG/ML (PF) SYRINGE
PREFILLED_SYRINGE | INTRAVENOUS | Status: DC | PRN
  Administered 2016-10-05 (×2): 20 mg via INTRAVENOUS
  Administered 2016-10-05: 10 mg via INTRAVENOUS
  Administered 2016-10-05: 50 mg via INTRAVENOUS

## 2016-10-05 MED ORDER — OXYCODONE-ACETAMINOPHEN 5-325 MG PO TABS
1.0000 | ORAL_TABLET | ORAL | Status: DC | PRN
  Administered 2016-10-05 – 2016-10-07 (×12): 1 via ORAL
  Filled 2016-10-05 (×12): qty 1

## 2016-10-05 MED ORDER — ALUM & MAG HYDROXIDE-SIMETH 200-200-20 MG/5ML PO SUSP: 30.0000 mL | Freq: Four times a day (QID) | ORAL | Status: DC | PRN

## 2016-10-05 MED ORDER — LISINOPRIL 20 MG PO TABS
20.0000 mg | ORAL_TABLET | Freq: Every day | ORAL | Status: DC
  Administered 2016-10-05: 20 mg via ORAL
  Filled 2016-10-05: qty 1

## 2016-10-05 MED ORDER — METHOCARBAMOL 500 MG PO TABS
500.0000 mg | ORAL_TABLET | Freq: Four times a day (QID) | ORAL | Status: DC | PRN
  Administered 2016-10-05 – 2016-10-07 (×7): 500 mg via ORAL
  Filled 2016-10-05 (×6): qty 1

## 2016-10-05 MED ORDER — MIDAZOLAM HCL 2 MG/2ML IJ SOLN
INTRAMUSCULAR | Status: DC | PRN
  Administered 2016-10-05: 2 mg via INTRAVENOUS

## 2016-10-05 MED ORDER — PHENYLEPHRINE HCL 10 MG/ML IJ SOLN
INTRAVENOUS | Status: DC | PRN
  Administered 2016-10-05: 25 ug/min via INTRAVENOUS

## 2016-10-05 MED ORDER — SUCCINYLCHOLINE CHLORIDE 200 MG/10ML IV SOSY
PREFILLED_SYRINGE | INTRAVENOUS | Status: AC
  Filled 2016-10-05: qty 10

## 2016-10-05 MED ORDER — ALBUMIN HUMAN 5 % IV SOLN
INTRAVENOUS | Status: DC | PRN
  Administered 2016-10-05: 10:00:00 via INTRAVENOUS

## 2016-10-05 MED ORDER — ONDANSETRON HCL 4 MG PO TABS: 4.0000 mg | ORAL_TABLET | Freq: Four times a day (QID) | ORAL | Status: DC | PRN

## 2016-10-05 MED ORDER — PHENYLEPHRINE 40 MCG/ML (10ML) SYRINGE FOR IV PUSH (FOR BLOOD PRESSURE SUPPORT)
PREFILLED_SYRINGE | INTRAVENOUS | Status: DC | PRN
  Administered 2016-10-05 (×2): 80 ug via INTRAVENOUS

## 2016-10-05 MED ORDER — CEFAZOLIN SODIUM-DEXTROSE 2-4 GM/100ML-% IV SOLN
2.0000 g | Freq: Three times a day (TID) | INTRAVENOUS | Status: AC
  Administered 2016-10-05 (×2): 2 g via INTRAVENOUS
  Filled 2016-10-05 (×2): qty 100

## 2016-10-05 MED ORDER — DEXAMETHASONE SODIUM PHOSPHATE 10 MG/ML IJ SOLN
INTRAMUSCULAR | Status: DC | PRN
  Administered 2016-10-05: 10 mg via INTRAVENOUS

## 2016-10-05 MED ORDER — DOCUSATE SODIUM 100 MG PO CAPS
100.0000 mg | ORAL_CAPSULE | Freq: Two times a day (BID) | ORAL | Status: DC
  Administered 2016-10-05 – 2016-10-07 (×5): 100 mg via ORAL
  Filled 2016-10-05 (×5): qty 1

## 2016-10-05 MED ORDER — PROPOFOL 10 MG/ML IV BOLUS
INTRAVENOUS | Status: AC
  Filled 2016-10-05: qty 20

## 2016-10-05 MED ORDER — FENTANYL CITRATE (PF) 100 MCG/2ML IJ SOLN
25.0000 ug | INTRAMUSCULAR | Status: DC | PRN
  Administered 2016-10-05 (×2): 50 ug via INTRAVENOUS

## 2016-10-05 MED ORDER — ROCURONIUM BROMIDE 50 MG/5ML IV SOSY
PREFILLED_SYRINGE | INTRAVENOUS | Status: AC
  Filled 2016-10-05: qty 5

## 2016-10-05 MED ORDER — THROMBIN 20000 UNITS EX SOLR
CUTANEOUS | Status: AC
  Filled 2016-10-05: qty 20000

## 2016-10-05 MED ORDER — BUPIVACAINE HCL (PF) 0.5 % IJ SOLN
INTRAMUSCULAR | Status: AC
  Filled 2016-10-05: qty 30

## 2016-10-05 MED ORDER — THROMBIN 5000 UNITS EX SOLR
OROMUCOSAL | Status: DC | PRN
  Administered 2016-10-05: 08:00:00 via TOPICAL

## 2016-10-05 MED ORDER — FAMOTIDINE 20 MG PO TABS
20.0000 mg | ORAL_TABLET | Freq: Two times a day (BID) | ORAL | Status: DC
  Administered 2016-10-05 – 2016-10-07 (×5): 20 mg via ORAL
  Filled 2016-10-05 (×5): qty 1

## 2016-10-05 MED ORDER — MENTHOL 3 MG MT LOZG: 1.0000 | LOZENGE | OROMUCOSAL | Status: DC | PRN

## 2016-10-05 MED ORDER — FENTANYL CITRATE (PF) 100 MCG/2ML IJ SOLN
INTRAMUSCULAR | Status: DC | PRN
  Administered 2016-10-05 (×2): 50 ug via INTRAVENOUS
  Administered 2016-10-05: 150 ug via INTRAVENOUS

## 2016-10-05 MED ORDER — SODIUM CHLORIDE 0.9 % IR SOLN
Status: DC | PRN
  Administered 2016-10-05: 08:00:00

## 2016-10-05 MED ORDER — ROCURONIUM BROMIDE 50 MG/5ML IV SOSY
PREFILLED_SYRINGE | INTRAVENOUS | Status: AC
  Filled 2016-10-05: qty 15

## 2016-10-05 MED ORDER — SODIUM CHLORIDE 0.9% FLUSH: 3.0000 mL | INTRAVENOUS | Status: DC | PRN

## 2016-10-05 MED ORDER — PHENOL 1.4 % MT LIQD: 1.0000 | OROMUCOSAL | Status: DC | PRN

## 2016-10-05 MED ORDER — LIDOCAINE 2% (20 MG/ML) 5 ML SYRINGE
INTRAMUSCULAR | Status: DC | PRN
  Administered 2016-10-05: 80 mg via INTRAVENOUS

## 2016-10-05 MED ORDER — SODIUM CHLORIDE 0.9% FLUSH
3.0000 mL | Freq: Two times a day (BID) | INTRAVENOUS | Status: DC
  Administered 2016-10-05: 3 mL via INTRAVENOUS
  Administered 2016-10-05: 10 mL via INTRAVENOUS
  Administered 2016-10-06 (×2): 3 mL via INTRAVENOUS

## 2016-10-05 MED ORDER — DEXAMETHASONE 4 MG PO TABS
2.0000 mg | ORAL_TABLET | Freq: Two times a day (BID) | ORAL | Status: DC
  Administered 2016-10-05 – 2016-10-07 (×5): 2 mg via ORAL
  Filled 2016-10-05 (×4): qty 1

## 2016-10-05 MED ORDER — LIDOCAINE-EPINEPHRINE 2 %-1:100000 IJ SOLN
INTRAMUSCULAR | Status: AC
  Filled 2016-10-05: qty 1

## 2016-10-05 MED ORDER — MIDAZOLAM HCL 2 MG/2ML IJ SOLN
INTRAMUSCULAR | Status: AC
  Filled 2016-10-05: qty 2

## 2016-10-05 MED ORDER — CEFAZOLIN SODIUM-DEXTROSE 2-4 GM/100ML-% IV SOLN
2.0000 g | INTRAVENOUS | Status: AC
  Administered 2016-10-05: 2 g via INTRAVENOUS
  Filled 2016-10-05: qty 100

## 2016-10-05 MED ORDER — THROMBIN 5000 UNITS EX SOLR
CUTANEOUS | Status: AC
  Filled 2016-10-05: qty 5000

## 2016-10-05 MED ORDER — ACETAMINOPHEN 650 MG RE SUPP: 650.0000 mg | RECTAL | Status: DC | PRN

## 2016-10-05 MED ORDER — PROMETHAZINE HCL 25 MG/ML IJ SOLN: 6.2500 mg | INTRAMUSCULAR | Status: DC | PRN

## 2016-10-05 MED ORDER — METHOCARBAMOL 1000 MG/10ML IJ SOLN: 500.0000 mg | Freq: Four times a day (QID) | INTRAVENOUS | Status: DC | PRN

## 2016-10-05 MED ORDER — LIDOCAINE 2% (20 MG/ML) 5 ML SYRINGE
INTRAMUSCULAR | Status: AC
  Filled 2016-10-05: qty 5

## 2016-10-05 MED ORDER — METHOCARBAMOL 500 MG PO TABS
ORAL_TABLET | ORAL | Status: AC
  Administered 2016-10-05: 500 mg via ORAL
  Filled 2016-10-05: qty 1

## 2016-10-05 MED ORDER — FENTANYL CITRATE (PF) 100 MCG/2ML IJ SOLN
INTRAMUSCULAR | Status: AC
  Administered 2016-10-05: 50 ug via INTRAVENOUS
  Filled 2016-10-05: qty 2

## 2016-10-05 MED ORDER — ATORVASTATIN CALCIUM 20 MG PO TABS
20.0000 mg | ORAL_TABLET | Freq: Every day | ORAL | Status: DC
  Administered 2016-10-05 – 2016-10-06 (×2): 20 mg via ORAL
  Filled 2016-10-05 (×2): qty 1

## 2016-10-05 MED ORDER — EPHEDRINE 5 MG/ML INJ
INTRAVENOUS | Status: AC
  Filled 2016-10-05: qty 10

## 2016-10-05 MED ORDER — ONDANSETRON HCL 4 MG/2ML IJ SOLN: 4.0000 mg | Freq: Four times a day (QID) | INTRAMUSCULAR | Status: DC | PRN

## 2016-10-05 MED ORDER — BISACODYL 10 MG RE SUPP: 10.0000 mg | Freq: Every day | RECTAL | Status: DC | PRN

## 2016-10-05 MED ORDER — HYDROCODONE-ACETAMINOPHEN 5-325 MG PO TABS
ORAL_TABLET | ORAL | Status: AC
  Administered 2016-10-05: 2 via ORAL
  Filled 2016-10-05: qty 2

## 2016-10-05 MED ORDER — PHENYLEPHRINE 40 MCG/ML (10ML) SYRINGE FOR IV PUSH (FOR BLOOD PRESSURE SUPPORT)
PREFILLED_SYRINGE | INTRAVENOUS | Status: AC
  Filled 2016-10-05: qty 10

## 2016-10-05 MED ORDER — 0.9 % SODIUM CHLORIDE (POUR BTL) OPTIME
TOPICAL | Status: DC | PRN
  Administered 2016-10-05: 1000 mL

## 2016-10-05 MED ORDER — POLYETHYLENE GLYCOL 3350 17 G PO PACK
17.0000 g | PACK | Freq: Every day | ORAL | Status: DC
  Administered 2016-10-05 – 2016-10-06 (×2): 17 g via ORAL
  Filled 2016-10-05 (×2): qty 1

## 2016-10-05 MED ORDER — SUGAMMADEX SODIUM 200 MG/2ML IV SOLN
INTRAVENOUS | Status: DC | PRN
  Administered 2016-10-05: 100 mg via INTRAVENOUS

## 2016-10-05 MED ORDER — POLYETHYLENE GLYCOL 3350 17 G PO PACK
17.0000 g | PACK | Freq: Every day | ORAL | Status: DC | PRN
Start: 2016-10-05 — End: 2016-10-07

## 2016-10-05 MED ORDER — ONDANSETRON HCL 4 MG/2ML IJ SOLN
INTRAMUSCULAR | Status: AC
  Filled 2016-10-05: qty 2

## 2016-10-05 MED ORDER — SENNA 8.6 MG PO TABS
1.0000 | ORAL_TABLET | Freq: Two times a day (BID) | ORAL | Status: DC
  Administered 2016-10-05 – 2016-10-07 (×5): 8.6 mg via ORAL
  Filled 2016-10-05 (×5): qty 1

## 2016-10-05 MED ORDER — SODIUM CHLORIDE 0.9 % IV SOLN: 250.0000 mL | INTRAVENOUS | Status: DC

## 2016-10-05 MED ORDER — THROMBIN 20000 UNITS EX SOLR
CUTANEOUS | Status: DC | PRN
  Administered 2016-10-05: 08:00:00 via TOPICAL

## 2016-10-05 MED ORDER — BUPIVACAINE HCL (PF) 0.5 % IJ SOLN
INTRAMUSCULAR | Status: DC | PRN
  Administered 2016-10-05: 5 mL
  Administered 2016-10-05: 30 mL

## 2016-10-05 SURGICAL SUPPLY — 70 items
BAG DECANTER FOR FLEXI CONT (MISCELLANEOUS) ×3 IMPLANT
BASKET BONE COLLECTION (BASKET) ×2 IMPLANT
BLADE CLIPPER SURG (BLADE) IMPLANT
BONE CANC CHIPS 20CC PCAN1/4 (Bone Implant) ×3 IMPLANT
BUR MATCHSTICK NEURO 3.0 LAGG (BURR) ×3 IMPLANT
CAGE PLIF MAS 9X8X28-4 LUMBAR (Cage) ×6 IMPLANT
CANISTER SUCT 3000ML PPV (MISCELLANEOUS) ×3 IMPLANT
CARTRIDGE OIL MAESTRO DRILL (MISCELLANEOUS) ×1 IMPLANT
CHIPS CANC BONE 20CC PCAN1/4 (Bone Implant) ×1 IMPLANT
CONNECTOR RELINE ROTATING 5-6 (Connector) ×6 IMPLANT
CONT SPEC 4OZ CLIKSEAL STRL BL (MISCELLANEOUS) ×3 IMPLANT
COVER BACK TABLE 60X90IN (DRAPES) ×3 IMPLANT
DECANTER SPIKE VIAL GLASS SM (MISCELLANEOUS) ×3 IMPLANT
DERMABOND ADVANCED (GAUZE/BANDAGES/DRESSINGS) ×2
DERMABOND ADVANCED .7 DNX12 (GAUZE/BANDAGES/DRESSINGS) ×1 IMPLANT
DEVICE DISSECT PLASMABLAD 3.0S (MISCELLANEOUS) ×1 IMPLANT
DIFFUSER DRILL AIR PNEUMATIC (MISCELLANEOUS) ×3 IMPLANT
DRAPE C-ARM 42X72 X-RAY (DRAPES) ×12 IMPLANT
DRAPE HALF SHEET 40X57 (DRAPES) IMPLANT
DRAPE LAPAROTOMY 100X72X124 (DRAPES) ×3 IMPLANT
DRAPE POUCH INSTRU U-SHP 10X18 (DRAPES) ×3 IMPLANT
DRSG OPSITE POSTOP 4X6 (GAUZE/BANDAGES/DRESSINGS) ×3 IMPLANT
DURAPREP 26ML APPLICATOR (WOUND CARE) ×3 IMPLANT
DURASEAL APPLICATOR TIP (TIP) IMPLANT
DURASEAL SPINE SEALANT 3ML (MISCELLANEOUS) IMPLANT
ELECT REM PT RETURN 9FT ADLT (ELECTROSURGICAL) ×3
ELECTRODE REM PT RTRN 9FT ADLT (ELECTROSURGICAL) ×1 IMPLANT
GAUZE SPONGE 4X4 12PLY STRL (GAUZE/BANDAGES/DRESSINGS) ×3 IMPLANT
GAUZE SPONGE 4X4 16PLY XRAY LF (GAUZE/BANDAGES/DRESSINGS) IMPLANT
GLOVE BIO SURGEON STRL SZ7.5 (GLOVE) ×3 IMPLANT
GLOVE BIOGEL PI IND STRL 8 (GLOVE) ×1 IMPLANT
GLOVE BIOGEL PI IND STRL 8.5 (GLOVE) ×2 IMPLANT
GLOVE BIOGEL PI INDICATOR 8 (GLOVE) ×2
GLOVE BIOGEL PI INDICATOR 8.5 (GLOVE) ×4
GLOVE ECLIPSE 8.5 STRL (GLOVE) ×6 IMPLANT
GOWN STRL REUS W/ TWL LRG LVL3 (GOWN DISPOSABLE) ×2 IMPLANT
GOWN STRL REUS W/ TWL XL LVL3 (GOWN DISPOSABLE) ×1 IMPLANT
GOWN STRL REUS W/TWL 2XL LVL3 (GOWN DISPOSABLE) ×9 IMPLANT
GOWN STRL REUS W/TWL LRG LVL3 (GOWN DISPOSABLE) ×4
GOWN STRL REUS W/TWL XL LVL3 (GOWN DISPOSABLE) ×2
HEMOSTAT POWDER KIT SURGIFOAM (HEMOSTASIS) ×3 IMPLANT
KIT BASIN OR (CUSTOM PROCEDURE TRAY) ×3 IMPLANT
KIT INFUSE SMALL (Orthopedic Implant) ×3 IMPLANT
KIT ROOM TURNOVER OR (KITS) ×3 IMPLANT
MILL MEDIUM DISP (BLADE) ×3 IMPLANT
MODULE POWER NUVASIVE (MISCELLANEOUS) ×1 IMPLANT
NEEDLE HYPO 22GX1.5 SAFETY (NEEDLE) ×3 IMPLANT
NS IRRIG 1000ML POUR BTL (IV SOLUTION) ×3 IMPLANT
OIL CARTRIDGE MAESTRO DRILL (MISCELLANEOUS) ×3
PACK LAMINECTOMY NEURO (CUSTOM PROCEDURE TRAY) ×3 IMPLANT
PAD ARMBOARD 7.5X6 YLW CONV (MISCELLANEOUS) ×9 IMPLANT
PATTIES SURGICAL .5 X1 (DISPOSABLE) ×3 IMPLANT
PLASMABLADE 3.0S (MISCELLANEOUS) ×3
POWER MODULE NUVASIVE (MISCELLANEOUS) ×3
ROD RELINE TI LATERAL MED OFF (Rod) ×6 IMPLANT
SCREW LOCK RELINE 5.5 TULIP (Screw) ×18 IMPLANT
SCREW RELINE POLY 5.5X50MM (Screw) ×3 IMPLANT
SCREW RELINE-O POLY 6.5X45 (Screw) ×3 IMPLANT
SPONGE LAP 4X18 X RAY DECT (DISPOSABLE) IMPLANT
SPONGE SURGIFOAM ABS GEL 100 (HEMOSTASIS) ×3 IMPLANT
SUT PROLENE 6 0 BV (SUTURE) IMPLANT
SUT VIC AB 1 CT1 18XBRD ANBCTR (SUTURE) ×2 IMPLANT
SUT VIC AB 1 CT1 8-18 (SUTURE) ×4
SUT VIC AB 2-0 CP2 18 (SUTURE) ×3 IMPLANT
SUT VIC AB 3-0 SH 8-18 (SUTURE) ×6 IMPLANT
SYR 3ML LL SCALE MARK (SYRINGE) ×12 IMPLANT
TOWEL GREEN STERILE (TOWEL DISPOSABLE) ×2 IMPLANT
TOWEL GREEN STERILE FF (TOWEL DISPOSABLE) ×3 IMPLANT
TRAY FOLEY W/METER SILVER 16FR (SET/KITS/TRAYS/PACK) ×3 IMPLANT
WATER STERILE IRR 1000ML POUR (IV SOLUTION) ×3 IMPLANT

## 2016-10-05 NOTE — Anesthesia Postprocedure Evaluation (Signed)
Anesthesia Post Note  Patient: Kathy Coleman  Procedure(s) Performed: Procedure(s) (LRB): Lumbar one - two Posterior lumbar interbody fusion (N/A)  Patient location during evaluation: PACU Anesthesia Type: General Level of consciousness: awake and alert Pain management: pain level controlled Vital Signs Assessment: post-procedure vital signs reviewed and stable Cardiovascular status: stable Anesthetic complications: yes       Last Vitals:  Vitals:   10/05/16 1140 10/05/16 1215  BP:  (!) 151/74  Pulse:  85  Resp:  20  Temp: 36.4 C 36.9 C    Last Pain:  Vitals:   10/05/16 1253  TempSrc:   PainSc: 7                  [employer]

## 2016-10-05 NOTE — Progress Notes (Signed)
Patient ID: Kathy Coleman, female   DOB: 1955-10-01, 61 y.o.   MRN: [id number] vss. Motor function doing well reasonably comfortable

## 2016-10-05 NOTE — Anesthesia Procedure Notes (Signed)
Procedure Name: Intubation Date/Time: 10/05/2016 7:58 AM Performed by: Julieta Bellini Pre-anesthesia Checklist: Patient identified, Emergency Drugs available, Suction available and Patient being monitored Patient Re-evaluated:Patient Re-evaluated prior to inductionOxygen Delivery Method: Circle system utilized Preoxygenation: Pre-oxygenation with 100% oxygen Intubation Type: IV induction Ventilation: Mask ventilation without difficulty Laryngoscope Size: Mac and 3 Grade View: Grade I Tube type: Oral Tube size: 7.0 mm Number of attempts: 1 Airway Equipment and Method: Stylet Placement Confirmation: ETT inserted through vocal cords under direct vision,  positive ETCO2 and breath sounds checked- equal and bilateral Secured at: 23 cm Tube secured with: Tape Dental Injury: Teeth and Oropharynx as per pre-operative assessment  Comments: Minimal manipulation to the neck during intubation

## 2016-10-05 NOTE — Transfer of Care (Signed)
Immediate Anesthesia Transfer of Care Note  Patient: Kathy Coleman  Procedure(s) Performed: Procedure(s) with comments: Lumbar one - two Posterior lumbar interbody fusion (N/A) - L1-2 Posterior lumbar interbody fusion  Patient Location: PACU  Anesthesia Type:General  Level of Consciousness: awake, alert , oriented and patient cooperative  Airway & Oxygen Therapy: Patient Spontanous Breathing and Patient connected to nasal cannula oxygen  Post-op Assessment: Report given to RN, Post -op Vital signs reviewed and stable and Patient moving all extremities X 4  Post vital signs: Reviewed and stable  Last Vitals:  Vitals:   10/05/16 0602  BP: (!) 113/57  Pulse: 64  Resp: 20  Temp: 36.8 C    Last Pain:  Vitals:   10/05/16 0609  TempSrc:   PainSc: 6          Complications: No apparent anesthesia complications

## 2016-10-05 NOTE — Op Note (Signed)
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 Posterior lumbar interbody arthrodesis with peek spacers local autograft and allograft infuse. Posterior lateral arthrodesis with local autograft allograft and infuse. Pedicle screw fixation of L1 to previous construct from L2-L5. Surgeon: Barnett Abu First assistant: Shirlean Kelly M.D. Anesthesia: Gen. endotracheal Indications: Kathy Coleman is 61 year old individual who's had significant back and lower extremity pain with significant disc herniation and stenosis at the L1-L2 level she's been feeling weakness and numbness radiating from her legs up into her trunk. She has evidence of high-grade canal stenosis with the retrolisthesis at the L1-L2 level. She's been advised regarding the need for surgery.  Procedure: Patient was brought to the operating room supine on a stretcher. After the smooth induction of general endotracheal anesthesia, she was turned prone. The back was prepped with alcohol and DuraPrep and draped in a sterile fashion. Previous scar was reopened and dissection was carried down to the lumbar dorsal fascia which was opened on either side of midline. The pedicle screw entry sites at L2 were identified. A short portion of the rod inferior to this was cleared. Some of overgrown bone in this area was rongeured away along with the high-speed drill an opening was made to place a side connector just below the L2 pedicle screw. Then the dissection was carried cephalad to expose the laminar arches of L1-L2 the pars the transverse processes. Pedicle entry sites for pedicle screws at L1 were chosen and a small hole was drilled into this chosen entry site.  These were later verified radiographically. On the right side there was noted to have been an old fracture of the transverse process of L1. The pedicle at L1 was diminutive. After dissecting this tissue out large laminotomies removing the entirety of the lamina and facet complex at L1-L2 was performed on either side. The common dural tube was explored and substantial thickened redundant yellow ligament was removed. This dissection allowed for exploration of the L1 nerve root superiorly in this area was decompressed using a 2 and a 3 mm Kerrison punch the L2 nerve root was decompressed inferiorly by performing generous laminotomies over the nerve root. Medially there was noted to be a substantial mass from a disc herniation that had occurred sometime before. This was first approached from the left side but only small fragments of disc were able to be retrieved. Then a laminotomy was created from the right side and here the bulk of the disc and several large fragments was removed from the epidural space. The disc space was then entered. It was noted to be severely collapsed. By distracting the disc space further markedly degenerated disc material was able to be removed. Disc height was reestablished to an 8 mm tall size. After decorticating the endplates and preparing them for grafting and 8 mm tall 28 mm long 4 lordotic spacer was chosen and was fitted with autograft and infuse. Strips of infuse were then packed into the interspace along with a total of 9 mL of bone graft that was injected through syringe. With the spacers placed radiographic confirmation was obtained and then pedicle entry sites were chosen first on the left side with a 6.5 x 45 mm pedicle screw being placed in L1 there any 5.5 x 50 mm screw being placed  on the right side. Radiographic confirmation of placement was verified and then Z rod was used between the SideArm connectors below L2 and the L1 pedicle screws. Lateral gutters were decorticated  and packed with the remainder of autograft and allograft in addition to 2 strips of infuse. With this hemostasis was checked and the wound the pads of the L1-L2 and nerve roots were checked to make sure there well decompressed as was the common dural tube and then the retractors were removed and final closure was performed with #1 Vicryl in lumbar dorsal fascia 2-0 Vicryl subcutaneously and 3-0 Vicryl subcuticularly. 20 mL of half percent Marcaine was injected into the paraspinous muscle and fascia in this area. Patient tolerated procedure well blood loss is estimated 300 mL.

## 2016-10-06 LAB — BASIC METABOLIC PANEL
ANION GAP: 9 (ref 5–15)
BUN: 15 mg/dL (ref 6–20)
CO2: 26 mmol/L (ref 22–32)
CREATININE: 0.86 mg/dL (ref 0.44–1.00)
Calcium: 8.9 mg/dL (ref 8.9–10.3)
Chloride: 104 mmol/L (ref 101–111)
GFR calc Af Amer: >60 mL/min (ref >60)
GLUCOSE: 139 mg/dL — AB (ref 65–99)
Potassium: 5 mmol/L (ref 3.5–5.1)
Sodium: 139 mmol/L (ref 135–145)

## 2016-10-06 LAB — CBC
HCT: 26.4 % — ABNORMAL LOW (ref 36.0–46.0)
Hemoglobin: 8.5 g/dL — ABNORMAL LOW (ref 12.0–15.0)
MCH: 27.7 pg (ref 26.0–34.0)
MCHC: 32.2 g/dL (ref 30.0–36.0)
MCV: 86 fL (ref 78.0–100.0)
PLATELETS: 201 10*3/uL (ref 150–400)
RBC: 3.07 MIL/uL — ABNORMAL LOW (ref 3.87–5.11)
RDW: 16.1 % — AB (ref 11.5–15.5)
WBC: 8.8 10*3/uL (ref 4.0–10.5)

## 2016-10-06 MED FILL — Heparin Sodium (Porcine) Inj 1000 Unit/ML: INTRAMUSCULAR | Qty: 30 | Status: AC

## 2016-10-06 MED FILL — Sodium Chloride IV Soln 0.9%: INTRAVENOUS | Qty: 1000 | Status: AC

## 2016-10-06 NOTE — Evaluation (Signed)
Physical Therapy Evaluation Patient Details Name: Kathy Coleman MRN: [id number] DOB: 09/03/1955 Today's Date: 10/06/2016   History of Present Illness  61 yo female s/p L2-3 PLIF PMH: PLIF L2-4 1/16, R THA 11/16 R TKA 12/16 L THA 12/17 cervical surg, ankle surg, HTN, L hip OA, GERD.  Clinical Impression  Pt is progressing well.  She requested to stay another night per safety and anxiety over a previous surgery experience.  She is min guard for ambulation and mobility and follows back precautions consistently.  She will benefit from PT services at the outpatient level. Will continue to follow acutely.    Follow Up Recommendations Outpatient PT    Equipment Recommendations       Recommendations for Other Services       Precautions / Restrictions Precautions Precautions: Back Precaution Comments: Pt recalled 2/3 precautions, leaving out L and adding A for arching.  Pt reeducated on precautions. Required Braces or Orthoses: Spinal Brace Spinal Brace: Lumbar corset;Applied in sitting position Restrictions Weight Bearing Restrictions: No      Mobility  Bed Mobility Overal bed mobility: Modified Independent             General bed mobility comments: practiced log roll technique with VC's for sequence.  Pt demonstrated comfort with log roll.  Transfers Overall transfer level: Modified independent Equipment used: Rolling walker (2 wheeled)             General transfer comment: Pt demonstrated proper hand placement and maintenance of precautions.  Ambulation/Gait Ambulation/Gait assistance: Min guard Ambulation Distance (Feet): 200 Feet Assistive device: Rolling walker (2 wheeled) Gait Pattern/deviations: Step-through pattern;Decreased stride length Gait velocity: decreased Gait velocity interpretation: Below normal speed for age/gender    Stairs            Wheelchair Mobility    Modified Rankin (Stroke Patients Only)       Balance Overall balance  assessment: Modified Independent                                           Pertinent Vitals/Pain Pain Assessment: 0-10 Pain Score: 6  Pain Location: back- R side above hip Pain Descriptors / Indicators: Guarding;Sharp Pain Intervention(s): Monitored during session    Home Living Family/patient expects to be discharged to:: Private residence Living Arrangements: Spouse/significant other;Other relatives Available Help at Discharge: Family Type of Home: House Home Access: Level entry     Home Layout: One level Home Equipment: Walker - 2 wheels;Bedside commode;Cane - single point;Shower seat;Adaptive equipment;Grab bars - tub/shower Additional Comments: spouse farms the land around the house so can be there 24/ 7 if needed    Prior Function Level of Independence: Independent         Comments: Mild baseline dizziness with change in position that quickly resolves.     Hand Dominance   Dominant Hand: Right    Extremity/Trunk Assessment   Upper Extremity Assessment Upper Extremity Assessment: Defer to OT evaluation    Lower Extremity Assessment Lower Extremity Assessment: Overall WFL for tasks assessed    Cervical / Trunk Assessment Cervical / Trunk Assessment: Other exceptions  Communication   Communication: No difficulties  Cognition Arousal/Alertness: Awake/alert Behavior During Therapy: WFL for tasks assessed/performed Overall Cognitive Status: Within Functional Limits for tasks assessed  General Comments General comments (skin integrity, edema, etc.): R low back pain started since surgery and was pt's main complaint.    Exercises     Assessment/Plan    PT Assessment Patient needs continued PT services  PT Problem List Decreased strength;Decreased balance;Decreased mobility;Decreased coordination       PT Treatment Interventions Gait training;Functional mobility training;Balance  training;Neuromuscular re-education;Patient/family education;Therapeutic exercise;Therapeutic activities    PT Goals (Current goals can be found in the Care Plan section)  Acute Rehab PT Goals Patient Stated Goal: To get back to cooking, cleaning, and tending to the chickens. PT Goal Formulation: With patient Time For Goal Achievement: 10/13/16 Potential to Achieve Goals: Good    Frequency Min 5X/week   Barriers to discharge        Co-evaluation               End of Session Equipment Utilized During Treatment: Gait belt;Back brace Activity Tolerance: Patient tolerated treatment well Patient left: in chair;with call bell/phone within reach;with family/visitor present Nurse Communication: Mobility status PT Visit Diagnosis: Other abnormalities of gait and mobility (R26.89)    Time: 1610-9604 PT Time Calculation (min) (ACUTE ONLY): 21 min   Charges:   PT Evaluation $PT Eval Low Complexity: 1 Procedure     PT G Codes:        Willaim Rayas SPT  Willaim Rayas 10/06/2016, 2:29 PM

## 2016-10-06 NOTE — Progress Notes (Signed)
Patient ID: Kathy Coleman, female   DOB: 08-Feb-1956, 61 y.o.   MRN: [id number] Signs stable Motor function is good Having some right flank pain Likely secondary to lack of transverse process on the right side Continue to mobilize Stable

## 2016-10-06 NOTE — Evaluation (Signed)
Occupational Therapy Evaluation Patient Details Name: Kathy Coleman MRN: [id number] DOB: [birth year] Today's Date: 10/06/2016    History of Present Illness 61 yo female s/p L2-3 PLIF PMH: PLIF L2-4 1/16, R THA 11/16 R TKA 12/16 L THA 12/17 cervical surg and ankle surg   Clinical Impression   Patient evaluated by Occupational Therapy with no further acute OT needs identified. All education has been completed and the patient has no further questions. See below for any follow-up Occupational Therapy or equipment needs. OT to sign off. Thank you for referral.      Follow Up Recommendations  No OT follow up    Equipment Recommendations  None recommended by OT    Recommendations for Other Services       Precautions / Restrictions Precautions Precautions: Back Precaution Comments: back handout provided and reviewed in detail.  Required Braces or Orthoses: Spinal Brace Spinal Brace: Lumbar corset;Applied in sitting position      Mobility Bed Mobility Overal bed mobility: Modified Independent                Transfers Overall transfer level: Modified independent Equipment used: Rolling walker (2 wheeled)                  Balance                                           ADL either performed or assessed with clinical judgement   ADL Overall ADL's : Modified independent                                       General ADL Comments: able to cross bil LE supine. able to don brace  able to complete bed mobility. reviewed peri care in detail   Pt educated on bathing and avoid washing directly on incision. Pt educated to use new wash cloth and towel each day. Pt educated to allow water to run across dressing and not to soak in a tub at this time. Pt advised RN will instruct on any bandages required otherwise is open to air.   Back handout provided and reviewed adls in detail. Pt educated on: clothing between brace, never sleep in brace,  set an alarm at night for medication, avoid sitting for long periods of time, correct bed positioning for sleeping, correct sequence for bed mobility, avoiding lifting more than 5 pounds and never wash directly over incision. All education is complete and patient indicates understanding. Demonstrated shower transfer supervision and has a seat if needed   Vision Baseline Vision/History: Wears glasses Wears Glasses: At all times       Perception     Praxis      Pertinent Vitals/Pain Pain Assessment: Faces Faces Pain Scale: Hurts a little bit Pain Location: back- R side above hip Pain Descriptors / Indicators: Operative site guarding Pain Intervention(s): Monitored during session;Premedicated before session;Patient requesting pain meds-RN notified;RN gave pain meds during session     Hand Dominance Right   Extremity/Trunk Assessment Upper Extremity Assessment Upper Extremity Assessment: Overall WFL for tasks assessed   Lower Extremity Assessment Lower Extremity Assessment: Defer to PT evaluation   Cervical / Trunk Assessment Cervical / Trunk Assessment: Other exceptions (s/p surg previous surg PLIF L2-4)   Communication Communication Communication: No difficulties  Cognition Arousal/Alertness: Awake/alert Behavior During Therapy: WFL for tasks assessed/performed Overall Cognitive Status: Within Functional Limits for tasks assessed                                     General Comments       Exercises     Shoulder Instructions      Home Living Family/patient expects to be discharged to:: Private residence Living Arrangements: Spouse/significant other Available Help at Discharge: Family;Available 24 hours/day Type of Home: House Home Access: Level entry     Home Layout: One level     Bathroom Shower/Tub: Walk-in shower;Door   Bathroom Toilet: Handicapped height     Home Equipment: Environmental consultant - 2 wheels;Bedside commode;Cane - single point;Shower  seat;Adaptive equipment;Grab bars - tub/shower (recliner / hospital bed) Adaptive Equipment: Reacher Additional Comments: spouse farms the land around the house so can be there 24/ 7 if needed      Prior Functioning/Environment Level of Independence: Independent                 OT Problem List:        OT Treatment/Interventions:      OT Goals(Current goals can be found in the care plan section)    OT Frequency:     Barriers to D/C:            Co-evaluation              End of Session Equipment Utilized During Treatment: Rolling walker;Back brace Nurse Communication: Mobility status;Precautions  Activity Tolerance: Patient tolerated treatment well Patient left: Other (comment) (in hall with PT )  OT Visit Diagnosis: Unsteadiness on feet (R26.81)                Time: 1610-9604 OT Time Calculation (min): 13 min Charges:  OT General Charges $OT Visit: 1 Procedure OT Evaluation $OT Eval Low Complexity: 1 Procedure G-Codes:      Mateo Flow   OTR/L Pager: [phone] Office: [phone] .   Boone Master B 10/06/2016, 9:38 AM

## 2016-10-07 ENCOUNTER — Inpatient Hospital Stay (HOSPITAL_COMMUNITY): Payer: Medicare FFS

## 2016-10-07 MED ORDER — MAGNESIUM HYDROXIDE 400 MG/5ML PO SUSP
30.0000 mL | Freq: Every day | ORAL | Status: DC | PRN
  Administered 2016-10-07: 30 mL via ORAL
  Filled 2016-10-07: qty 30

## 2016-10-07 MED ORDER — DEXAMETHASONE 1 MG PO TABS: ORAL_TABLET | ORAL | 0 refills | Status: DC

## 2016-10-07 MED ORDER — METHOCARBAMOL 500 MG PO TABS: 500.0000 mg | ORAL_TABLET | Freq: Four times a day (QID) | ORAL | 3 refills | Status: DC | PRN

## 2016-10-07 MED ORDER — OXYCODONE-ACETAMINOPHEN 5-325 MG PO TABS: 1.0000 | ORAL_TABLET | ORAL | 0 refills | Status: DC | PRN

## 2016-10-07 NOTE — Progress Notes (Signed)
Patient ID: Kathy Coleman, female   DOB: [birth year], 61 y.o.   MRN: [id number] Vital signs are stable Motor function appears still intact Patient however is complaining of substantially more pain in the region of the right flank and into both lower extremities with any sort of straining Will obtain a CT scan lumbar spine today Continue to enforce ambulation Bowels have not moved Has had milk of magnesia this morning

## 2016-10-07 NOTE — Progress Notes (Signed)
Patient alert and oriented, mae's well, voiding adequate amount of urine, swallowing without difficulty, c/o pain and medication given prior to discharged. Patient discharged home with family. Script and discharged instructions given to patient. Patient and family stated understanding of d/c instructions given and has an appointment to f/u with MD in 3 weeks.

## 2016-10-07 NOTE — Progress Notes (Signed)
Physical Therapy Treatment Patient Details Name: Kathy Coleman MRN: [id number] DOB: [birth year] Today's Date: 10/07/2016    History of Present Illness 61 yo female s/p L2-3 PLIF PMH: PLIF L2-4 1/16, R THA 11/16 R TKA 12/16 L THA 12/17 cervical surg, ankle surg, HTN, L hip OA, GERD.    PT Comments    Pt has met goals for independent bed mobility and requiring Mod I for transfers.  She was min guard for ambulation because of new pain symptoms in both legs. She is recalling and following back precautions.  She will benefit from continued PT services at the venue listed below to return to baseline functioning.  Will follow acutely.   Follow Up Recommendations  Outpatient PT     Equipment Recommendations       Recommendations for Other Services       Precautions / Restrictions Precautions Precautions: Back Precaution Comments: Pt recalled 3/3 precautions Required Braces or Orthoses: Spinal Brace Spinal Brace: Lumbar corset Restrictions Weight Bearing Restrictions: No    Mobility  Bed Mobility Overal bed mobility: Independent             General bed mobility comments: pt sidelying at start of session.  No cues needed for lidelying > sit.  Transfers Overall transfer level: Modified independent Equipment used: Rolling walker (2 wheeled)             General transfer comment: Pt demonstrated proper hand placement and maintenance of precautions.  Ambulation/Gait Ambulation/Gait assistance: Min guard Ambulation Distance (Feet): 250 Feet Assistive device: Rolling walker (2 wheeled) Gait Pattern/deviations: Step-through pattern;Decreased stride length Gait velocity: decreased Gait velocity interpretation: Below normal speed for age/gender General Gait Details: Min guard for safety since pt stated new pain in legs.  Standing rest break required <20 seconds.   Stairs            Wheelchair Mobility    Modified Rankin (Stroke Patients Only)       Balance  Overall balance assessment: Modified Independent                                          Cognition Arousal/Alertness: Awake/alert Behavior During Therapy: WFL for tasks assessed/performed Overall Cognitive Status: Within Functional Limits for tasks assessed                                        Exercises      General Comments        Pertinent Vitals/Pain Pain Assessment: Faces Faces Pain Scale: Hurts even more Pain Location: back- R side above hip, posterolateral legs down to knees Pain Descriptors / Indicators: Guarding;Sharp;Aching;Discomfort;Radiating Pain Intervention(s): Monitored during session;Repositioned;Limited activity within patient's tolerance    Home Living                      Prior Function            PT Goals (current goals can now be found in the care plan section) Acute Rehab PT Goals Patient Stated Goal: To get back to cooking, cleaning, and tending to the chickens. PT Goal Formulation: With patient Time For Goal Achievement: 10/13/16 Potential to Achieve Goals: Good Progress towards PT goals: Progressing toward goals    Frequency    Min 5X/week  PT Plan Current plan remains appropriate    Co-evaluation             End of Session Equipment Utilized During Treatment: Gait belt;Back brace Activity Tolerance: Patient limited by fatigue;Patient limited by pain Patient left: in chair;with call bell/phone within reach;with family/visitor present Nurse Communication: Mobility status PT Visit Diagnosis: Other abnormalities of gait and mobility (R26.89)     Time: 1594-5859 PT Time Calculation (min) (ACUTE ONLY): 15 min  Charges:                       G Codes:       Gaetano Net SPT   Gaetano Net 10/07/2016, 12:49 PM

## 2016-10-27 NOTE — Discharge Summary (Signed)
Physician Discharge Summary  Patient ID: Kathy Coleman MRN: [id number] DOB/AGE: 62-May-1957 62 y.o.  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  Consults: None  Significant Diagnostic Studies: None  Treatments: surgery: Decompression and fusion L1-L2  Discharge Exam: Blood pressure 139/69, pulse 76, temperature 99 F (37.2 C), resp. rate 18, weight 74.8 kg (165 lb), SpO2 99 %. Incision is clean and dry motor function is intact Asian gait are intact.  Disposition: 01-Home or Self Care  Discharge Instructions    Incentive spirometry RT    Complete by:  As directed      Allergies as of 10/07/2016      Reactions   Decadron [dexamethasone] Other (See Comments)   Severe burning in perineal area   No Known Allergies       Medication List    STOP taking these medications   indomethacin 50 MG capsule Commonly known as:  INDOCIN     TAKE these medications   ADULT GUMMY PO Take 2 tablets by mouth daily.   atorvastatin 20 MG tablet Commonly known as:  LIPITOR Take 20 mg by mouth daily at 6 PM.   dexamethasone 1 MG tablet Commonly known as:  DECADRON 2 tablets twice daily for 2 days, one tablet twice daily for 2 days, one tablet daily for 2 days.   oxyCODONE-acetaminophen 10-325 MG tablet Commonly known as:  PERCOCET Take 1 tablet by mouth every 4 (four) hours as needed for pain. What changed:  Another medication with the same name was added. Make sure you understand how and when to  take each.   ENDOCET 10-325 MG tablet Generic drug:  oxyCODONE-acetaminophen Take 1 tablet by mouth every 4 (four) hours as needed. pain What changed:  Another medication with the same name was added. Make sure you understand how and when to take each.   oxyCODONE-acetaminophen 5-325 MG tablet Commonly known as:  PERCOCET/ROXICET Take 1-2 tablets by mouth every 4 (four) hours as needed for severe pain. What changed:  You were already taking a medication with the same name, and this prescription was added. Make sure you understand how and when to take each.   gabapentin 300 MG capsule Commonly known as:  NEURONTIN Take 300 mg by mouth 3 (three) times daily.   lisinopril 20 MG tablet Commonly known as:  PRINIVIL,ZESTRIL Take 20 mg by mouth daily.   methocarbamol 500 MG tablet Commonly known as:  ROBAXIN Take 1 tablet (500 mg total) by mouth every 6 (six) hours as needed for muscle spasms.   polyethylene glycol packet Commonly known as:  MIRALAX / GLYCOLAX Take 17 g by mouth at bedtime.   ranitidine 150 MG tablet Commonly known as:  ZANTAC Take 150 mg by mouth at bedtime as needed for heartburn.   traMADol 50 MG tablet Commonly known as:  ULTRAM Take 100 mg by mouth every 8 (eight) hours as needed for moderate pain.      Follow-up Information    Stefani Dama, MD Follow up.   Specialty:  Neurosurgery Contact information: [address] [place] [address] [phone]  SignedStefani Dama 10/27/2016, 3:42 PM

## 2017-03-22 ENCOUNTER — Other Ambulatory Visit: Payer: Self-pay | Admitting: Neurological Surgery

## 2017-03-22 ENCOUNTER — Other Ambulatory Visit (HOSPITAL_COMMUNITY): Payer: Self-pay | Admitting: Neurological Surgery

## 2017-03-22 DIAGNOSIS — M5416 Radiculopathy, lumbar region: Secondary | ICD-10-CM

## 2017-03-31 ENCOUNTER — Ambulatory Visit (HOSPITAL_COMMUNITY)
Admission: RE | Admit: 2017-03-31 | Discharge: 2017-03-31 | Disposition: A | Discharge status: Home / Self Care | Payer: Medicare FFS | Source: Ambulatory Visit | Attending: Neurological Surgery | Admitting: Neurological Surgery

## 2017-03-31 DIAGNOSIS — M5416 Radiculopathy, lumbar region: Secondary | ICD-10-CM | POA: Diagnosis not present

## 2017-03-31 DIAGNOSIS — M5134 Other intervertebral disc degeneration, thoracic region: Secondary | ICD-10-CM | POA: Insufficient documentation

## 2017-03-31 DIAGNOSIS — M5135 Other intervertebral disc degeneration, thoracolumbar region: Secondary | ICD-10-CM | POA: Insufficient documentation

## 2017-03-31 DIAGNOSIS — Z981 Arthrodesis status: Secondary | ICD-10-CM | POA: Diagnosis not present

## 2017-03-31 DIAGNOSIS — Z9049 Acquired absence of other specified parts of digestive tract: Secondary | ICD-10-CM | POA: Insufficient documentation

## 2017-03-31 DIAGNOSIS — M4317 Spondylolisthesis, lumbosacral region: Secondary | ICD-10-CM | POA: Insufficient documentation

## 2017-03-31 MED ORDER — HYDROCODONE-ACETAMINOPHEN 5-325 MG PO TABS: 1.0000 | ORAL_TABLET | ORAL | Status: DC | PRN

## 2017-03-31 MED ORDER — ONDANSETRON HCL 4 MG/2ML IJ SOLN: 4.0000 mg | Freq: Four times a day (QID) | INTRAMUSCULAR | Status: DC | PRN

## 2017-03-31 MED ORDER — IOPAMIDOL (ISOVUE-M 200) INJECTION 41%
20.0000 mL | Freq: Once | INTRAMUSCULAR | Status: AC
  Administered 2017-03-31: 12 mL via INTRATHECAL

## 2017-03-31 MED ORDER — LIDOCAINE HCL (PF) 1 % IJ SOLN
5.0000 mL | Freq: Once | INTRAMUSCULAR | Status: AC
  Administered 2017-03-31: 2 mL via INTRADERMAL

## 2017-03-31 MED ORDER — DIAZEPAM 5 MG PO TABS
10.0000 mg | ORAL_TABLET | Freq: Once | ORAL | Status: AC
  Administered 2017-03-31: 10 mg via ORAL

## 2017-03-31 MED ORDER — DIAZEPAM 5 MG PO TABS
ORAL_TABLET | ORAL | Status: AC
  Administered 2017-03-31: 10 mg via ORAL
  Filled 2017-03-31: qty 2

## 2017-03-31 NOTE — Discharge Instructions (Signed)
Myelogram, Care After °Refer to this sheet in the next few weeks. These instructions provide you with information about caring for yourself after your procedure. Your health care provider may also give you more specific instructions. Your treatment has been planned according to current medical practices, but problems sometimes occur. Call your health care provider if you have any problems or questions after your procedure. °What can I expect after the procedure? °After the procedure, it is common to have: °· Soreness at your injection site. °· A mild headache. ° °Follow these instructions at home: °· Drink enough fluid to keep your urine clear or pale yellow. This will help flush out the dye (contrast material) from your spine. °· Rest as told by your health care provider. Lie flat with your head slightly raised (elevated) to reduce the risk of headache. °· Do not bend, lift, or do any strenuous activity for 24-48 hours or as told by your health care provider. °· Take over-the-counter and prescription medicines only as told by your health care provider. °· Take care of and remove your bandage (dressing) as told by your health care provider. °· Bathe or shower as told by your health care provider. °Contact a health care provider if: °· You have a fever. °· You have a headache that lasts longer than 24 hours. °· You feel nauseous or vomit. °· You have a stiff neck or numbness in your legs. °· You are unable to urinate or have a bowel movement. °· You develop a rash, itching, or sneezing. °Get help right away if: °· You have new symptoms or your symptoms get worse. °· You have a seizure. °· You have trouble breathing. °This information is not intended to replace advice given to you by your health care provider. Make sure you discuss any questions you have with your health care provider. °Document Released: 07/19/2015 Document Revised: 11/28/2015 Document Reviewed: 04/04/2015 °Elsevier Interactive Patient Education ©  2018 Elsevier Inc. ° °

## 2017-03-31 NOTE — Procedures (Signed)
Date of procedure: 03/31/2017 Ms. Kathy Coleman is a 61 year old individual who's had significant surgical decompression and stabilization of lumbar spine from L1-S1 she has a persistent right radicular pain that has been troubling all along and has not gotten better since her last surgery. She is seen now for further evaluation of this process. Myelogram is being performed because of the extensive internal hardware that is present.  Pre op Dx: Right lumbar radiculopathy, history of decompression and fusion from L1-S1 Post op Dx: Same Procedure: Lumbar myelogram Surgeon:  Puncture level: L3-4 Fluid color: Clear colorless Injection: Isovue-200, 12 mL Findings: No overt neural compromise, question of arachnoiditis about L3.

## 2017-04-07 ENCOUNTER — Encounter: Payer: Self-pay | Admitting: Internal Medicine

## 2017-06-14 NOTE — Pre-Procedure Instructions (Signed)
Kathy MediciMyra L Coleman  06/14/2017      [facility] [place] - [phone] [address] [place] [id number] Phone: [phone] Fax: [phone]  [facility] [place] - [address] [place] [address] Phone: [phone] Fax: [phone]    Your procedure is scheduled on Monday December 17.  [facility] 7:30 A.M.  Call this number if you have problems the morning of surgery:  [phone]   Remember:  Do not eat food or drink liquids after midnight.  Take these medicines the morning of surgery with A SIP OF WATER:   Gabapentin (neurontin) Hydrocodone (Norco) Methocarbamol (robaxin) if needed Tramadol (ulrtam) if needed  7 days prior to surgery STOP taking any Aspirin(unless otherwise instructed by your surgeon), Aleve, Naproxen, Ibuprofen, Motrin, Advil, Goody's, BC's, all herbal medications, fish oil, and all vitamins    Do not wear jewelry, make-up or nail polish.  Do not wear lotions, powders, or perfumes, or deodorant.  Do not shave 48 hours prior to surgery.  Men may shave face and neck.  Do not bring valuables to the hospital.  [facility] is not responsible for any belongings or valuables.  Contacts, dentures or bridgework may not be worn into surgery.  Leave your suitcase in the car.  After surgery it may be brought to your room.  For patients admitted to the hospital, discharge time will be determined by your treatment team.  Patients discharged the day of surgery will not be allowed to drive home.   Special instructions:    [place]- Preparing For Surgery  Before surgery, you can play an important role. Because skin is not sterile, your skin needs to be as free of germs as possible. You can reduce the number of germs on your skin by washing with CHG (chlorahexidine gluconate) Soap before surgery.  CHG is an antiseptic cleaner which kills  germs and bonds with the skin to continue killing germs even after washing.  Please do not use if you have an allergy to CHG or antibacterial soaps. If your skin becomes reddened/irritated stop using the CHG.  Do not shave (including legs and underarms) for at least 48 hours prior to first CHG shower. It is OK to shave your face.  Please follow these instructions carefully.   1. Shower the NIGHT BEFORE SURGERY and the MORNING OF SURGERY with CHG.   2. If you chose to wash your hair, wash your hair first as usual with your normal shampoo.  3. After you shampoo, rinse your hair and body thoroughly to remove the shampoo.  4. Use CHG as you would any other liquid soap. You can apply CHG directly to the skin and wash gently with a scrungie or a clean washcloth.   5. Apply the CHG Soap to your body ONLY FROM THE NECK DOWN.  Do not use on open wounds or open sores. Avoid contact with your eyes, ears, mouth and genitals (private parts). Wash Face and genitals (private parts)  with your normal soap.  6. Wash thoroughly, paying special attention to the area where your surgery will be performed.  7. Thoroughly rinse your body with warm water from the neck down.  8. DO NOT shower/wash with your normal soap after using and rinsing off the CHG Soap.  9. Pat yourself dry with a CLEAN TOWEL.  10. Wear CLEAN PAJAMAS to  bed the night before surgery, wear comfortable clothes the morning of surgery  11. Place CLEAN SHEETS on your bed the night of your first shower and DO NOT SLEEP WITH PETS.    Day of Surgery: Do not apply any deodorants/lotions. Please wear clean clothes to the hospital/surgery center.      Please read over the following fact sheets that you were given. Coughing and Deep Breathing, MRSA Information and Surgical Site Infection Prevention

## 2017-06-15 ENCOUNTER — Inpatient Hospital Stay (HOSPITAL_COMMUNITY): Admission: RE | Admit: 2017-06-15 | Payer: Medicare FFS | Source: Ambulatory Visit

## 2017-06-16 DIAGNOSIS — M1712 Unilateral primary osteoarthritis, left knee: Secondary | ICD-10-CM | POA: Diagnosis present

## 2017-06-16 NOTE — H&P (Signed)
TOTAL KNEE ADMISSION H&P  Patient is being admitted for left total knee arthroplasty.  Subjective:  Chief Complaint:left knee pain.  HPI: Kathy Coleman, 61 y.o. female, has a history of pain and functional disability in the left knee due to arthritis and has failed non-surgical conservative treatments for greater than 12 weeks to includeNSAID's and/or analgesics, flexibility and strengthening excercises, weight reduction as appropriate and activity modification.  Onset of symptoms was gradual, starting 2 years ago with gradually worsening course since that time. The patient noted no past surgery on the left knee(s).  Patient currently rates pain in the left knee(s) at 10 out of 10 with activity. Patient has night pain, worsening of pain with activity and weight bearing, pain that interferes with activities of daily living, pain with passive range of motion, crepitus and joint swelling.  Patient has evidence of joint space narrowing by imaging studies.   There is no active infection.  Patient Active Problem List   Diagnosis Date Noted  . Herniated nucleus pulposus, lumbar 10/05/2016  . Primary localized osteoarthritis of left hip 06/15/2016  . Primary osteoarthritis of left hip 06/12/2016  . Dysphagia 05/15/2016  . Arthritis of knee 06/24/2015  . Primary osteoarthritis of right knee 06/22/2015  . Primary osteoarthritis of right hip 05/11/2015  . Postoperative anemia due to acute blood loss 07/24/2014  . Lumbar radiculopathy 07/23/2014   Past Medical History:  Diagnosis Date  . Arthritis   . Constipation    takes Miralax nightly  . GERD (gastroesophageal reflux disease)    takes Zantac daily  . History of blood transfusion   . History of kidney stones   . Hyperlipidemia    takes Atorvastatin daily  . Hypertension    takes Lisinopril daily  . Osteoarthritis    left hip  . PONV (postoperative nausea and vomiting)    in the past did not have with last surgery in december    Past  Surgical History:  Procedure Laterality Date  . ABDOMINAL HYSTERECTOMY    . BACK SURGERY     1998  , 2010  LOW BACK    . BIOPSY  02/13/2016   Procedure: BIOPSY;  Surgeon: Corbin Adeobert M Rourk, MD;  Location: [facility];  Service: Endoscopy;;  Gastric and Esophageal biopsies  . CHOLECYSTECTOMY    . COLONOSCOPY  03/24/2006   Dr.Rourk- a couple of anal papillae, o/w normal rectum with L sided diverticula, the remainder of the colonic mucosa appeared normal.   . COLONOSCOPY N/A 02/13/2016   Dr. Jena Gaussourk: diverticulosis, melanosis coli   . ESOPHAGOGASTRODUODENOSCOPY  03/24/2006   Dr.Rourk- normal esophagus. a couple of antral erosions, o/w normal stomach, patent pylorus, normal D1,D2  . ESOPHAGOGASTRODUODENOSCOPY N/A 02/13/2016   Dr. Jena Gaussourk: erythematous mucosa, normal D2, empiric dilation   . FOOT SURGERY     RIGHT  2014  . MALONEY DILATION N/A 02/13/2016   Procedure: Elease HashimotoMALONEY DILATION;  Surgeon: Corbin Adeobert M Rourk, MD;  Location: [facility];  Service: Endoscopy;  Laterality: N/A;  . NECK SURGERY     2005, 2010  . TOTAL HIP ARTHROPLASTY Right 05/13/2015   Procedure: TOTAL HIP ARTHROPLASTY ANTERIOR APPROACH;  Surgeon: Gean BirchwoodFrank Rowan, MD;  Location: [facility] OR;  Service: Orthopedics;  Laterality: Right;  . TOTAL HIP ARTHROPLASTY Left 06/15/2016   Procedure: TOTAL HIP ARTHROPLASTY ANTERIOR APPROACH;  Surgeon: Gean BirchwoodFrank Rowan, MD;  Location: [facility] OR;  Service: Orthopedics;  Laterality: Left;  . TOTAL KNEE ARTHROPLASTY Right 06/24/2015   Procedure: TOTAL KNEE ARTHROPLASTY;  Surgeon: Gean Birchwood, MD;  Location[facility] OR;  Service: Orthopedics;  Laterality: Right;    No current facility-administered medications for this encounter.    Current Outpatient Medications  Medication Sig Dispense Refill Last Dose  . atorvastatin (LIPITOR) 20 MG tablet Take 20 mg by mouth at bedtime.    03/30/2017 at Unknown time  . gabapentin (NEURONTIN) 300 MG capsule Take 300 mg by mouth 3 (three) times daily.   03/30/2017 at Unknown time  .  HYDROcodone-acetaminophen (NORCO/VICODIN) 5-325 MG tablet Take 1 tablet by mouth 2 (two) times daily.     . indomethacin (INDOCIN) 50 MG capsule Take 50 mg by mouth 3 (three) times daily with meals.   03/30/2017 at Unknown time  . lisinopril (PRINIVIL,ZESTRIL) 20 MG tablet Take 20 mg by mouth daily.   03/30/2017 at Unknown time  . methocarbamol (ROBAXIN) 500 MG tablet Take 1 tablet (500 mg total) by mouth every 6 (six) hours as needed for muscle spasms. 40 tablet 3 Past Week at Unknown time  . Multiple Vitamins-Minerals (ADULT GUMMY PO) Take 2 tablets by mouth daily.   03/30/2017 at Unknown time  . polyethylene glycol (MIRALAX / GLYCOLAX) packet Take 17 g by mouth 2 (two) times daily.    03/30/2017 at Unknown time  . ranitidine (ZANTAC) 150 MG tablet Take 150 mg by mouth at bedtime.    Past Week at Unknown time  . traMADol (ULTRAM) 50 MG tablet Take 50-100 mg by mouth every 8 (eight) hours. 1-2 tablets depending on pain   Past Week at Unknown time   Allergies  Allergen Reactions  . Decadron [Dexamethasone] Other (See Comments)    Severe burning in perineal area    Social History   Tobacco Use  . Smoking status: Former Games developer  . Smokeless tobacco: Never Used  . Tobacco comment: Quit x 30 years  Substance Use Topics  . Alcohol use: No    Alcohol/week: 0.0 oz    Family History  Problem Relation Age of Onset  . Hypertension Mother   . COPD Mother   . Diabetes Mother   . Heart disease Father   . Diabetes Father   . Hypertension Father      Review of Systems  Constitutional: Negative.   HENT: Negative.   Eyes: Negative.   Respiratory: Negative.   Cardiovascular:       HTN  Gastrointestinal: Negative.   Genitourinary: Negative.   Musculoskeletal: Positive for joint pain.  Skin: Negative.   Neurological: Positive for dizziness.  Endo/Heme/Allergies: Negative.   Psychiatric/Behavioral: Negative.     Objective:  Physical Exam  Constitutional: She is oriented to person, place,  and time. She appears well-developed and well-nourished.  HENT:  Head: Normocephalic and atraumatic.  Eyes: Pupils are equal, round, and reactive to light.  Neck: Normal range of motion. Neck supple.  Cardiovascular: Intact distal pulses.  Respiratory: Effort normal.  Musculoskeletal: She exhibits tenderness.  the patient has good strength and good range of motion in the right knee.  Minimal swelling in the right knee.  She does have tenderness over the suprapatellar region near the insertion of the quadriceps tendon.  No joint line tenderness medially or laterally.  No instability with valgus or varus stress.  Overall good range of motion from 0-120.  Patient's left knee does have tenderness over the lateral joint line.  No noticeable effusion.  Obvious crepitus with range of motion.  No instability.  Calves are soft and nontender bilaterally.  She is neurovascularly intact  distally.  Neurological: She is alert and oriented to person, place, and time.  Skin: Skin is warm and dry.  Psychiatric: She has a normal mood and affect. Her behavior is normal. Judgment and thought content normal.    Vital signs in last 24 hours: BP: ()/()  Arterial Line BP: ()/()   Labs:   Estimated body mass index is 29.12 kg/m as calculated from the following:   Height as of 03/31/17: 5\' 5"  (1.651 m).   Weight as of 03/31/17: 79.4 kg (175 lb).   Imaging Review Plain radiographs demonstrate  near end-stage arthritis lateral compartment near bone-on-bone left knee.  Assessment/Plan:  End stage arthritis, left knee   The patient history, physical examination, clinical judgment of the provider and imaging studies are consistent with end stage degenerative joint disease of the left knee(s) and total knee arthroplasty is deemed medically necessary. The treatment options including medical management, injection therapy arthroscopy and arthroplasty were discussed at length. The risks and benefits of total knee  arthroplasty were presented and reviewed. The risks due to aseptic loosening, infection, stiffness, patella tracking problems, thromboembolic complications and other imponderables were discussed. The patient acknowledged the explanation, agreed to proceed with the plan and consent was signed. Patient is being admitted for inpatient treatment for surgery, pain control, PT, OT, prophylactic antibiotics, VTE prophylaxis, progressive ambulation and ADL's and discharge planning. The patient is planning to be discharged home with home health services.

## 2017-06-18 ENCOUNTER — Encounter (HOSPITAL_COMMUNITY)
Admission: RE | Admit: 2017-06-18 | Discharge: 2017-06-18 | Disposition: A | Discharge status: Home / Self Care | Payer: Medicare FFS | Source: Ambulatory Visit | Attending: Orthopedic Surgery | Admitting: Orthopedic Surgery

## 2017-06-18 ENCOUNTER — Encounter (HOSPITAL_COMMUNITY): Payer: Self-pay

## 2017-06-18 ENCOUNTER — Other Ambulatory Visit: Payer: Self-pay

## 2017-06-18 DIAGNOSIS — Z01818 Encounter for other preprocedural examination: Secondary | ICD-10-CM

## 2017-06-18 LAB — URINALYSIS, ROUTINE W REFLEX MICROSCOPIC
BACTERIA UA: NONE SEEN
BILIRUBIN URINE: NEGATIVE
GLUCOSE, UA: NEGATIVE mg/dL
KETONES UR: NEGATIVE mg/dL
LEUKOCYTES UA: NEGATIVE
NITRITE: NEGATIVE
PROTEIN: NEGATIVE mg/dL
Specific Gravity, Urine: 1.009 (ref 1.005–1.030)
pH: 5 (ref 5.0–8.0)

## 2017-06-18 LAB — BASIC METABOLIC PANEL
Anion gap: 6 (ref 5–15)
BUN: 21 mg/dL — AB (ref 6–20)
CALCIUM: 9.1 mg/dL (ref 8.9–10.3)
CO2: 27 mmol/L (ref 22–32)
CREATININE: 1.21 mg/dL — AB (ref 0.44–1.00)
Chloride: 105 mmol/L (ref 101–111)
GFR calc non Af Amer: 47 mL/min — ABNORMAL LOW (ref >60)
GFR, EST AFRICAN AMERICAN: 54 mL/min — AB (ref >60)
Glucose, Bld: 84 mg/dL (ref 65–99)
Potassium: 4.3 mmol/L (ref 3.5–5.1)
SODIUM: 138 mmol/L (ref 135–145)

## 2017-06-18 LAB — CBC WITH DIFFERENTIAL/PLATELET
BASOS PCT: 1 %
Basophils Absolute: 0 10*3/uL (ref 0.0–0.1)
EOS ABS: 0.2 10*3/uL (ref 0.0–0.7)
EOS PCT: 3 %
HCT: 40.6 % (ref 36.0–46.0)
HEMOGLOBIN: 13.3 g/dL (ref 12.0–15.0)
LYMPHS ABS: 1.4 10*3/uL (ref 0.7–4.0)
Lymphocytes Relative: 24 %
MCH: 30.5 pg (ref 26.0–34.0)
MCHC: 32.8 g/dL (ref 30.0–36.0)
MCV: 93.1 fL (ref 78.0–100.0)
MONOS PCT: 9 %
Monocytes Absolute: 0.5 10*3/uL (ref 0.1–1.0)
NEUTROS PCT: 65 %
Neutro Abs: 3.7 10*3/uL (ref 1.7–7.7)
PLATELETS: 260 10*3/uL (ref 150–400)
RBC: 4.36 MIL/uL (ref 3.87–5.11)
RDW: 13.5 % (ref 11.5–15.5)
WBC: 5.8 10*3/uL (ref 4.0–10.5)

## 2017-06-18 LAB — SURGICAL PCR SCREEN
MRSA, PCR: NEGATIVE
STAPHYLOCOCCUS AUREUS: NEGATIVE

## 2017-06-18 LAB — APTT: aPTT: 32 seconds (ref 24–36)

## 2017-06-18 LAB — TYPE AND SCREEN
ABO/RH(D): O POS
Antibody Screen: NEGATIVE

## 2017-06-18 LAB — PROTIME-INR
INR: 0.97
PROTHROMBIN TIME: 12.8 s (ref 11.4–15.2)

## 2017-06-18 MED ORDER — TRANEXAMIC ACID 1000 MG/10ML IV SOLN
2000.0000 mg | INTRAVENOUS | Status: DC
  Filled 2017-06-18: qty 20

## 2017-06-18 MED ORDER — LACTATED RINGERS IV SOLN
INTRAVENOUS | Status: DC
  Administered 2017-06-21 (×2): via INTRAVENOUS

## 2017-06-18 MED ORDER — BUPIVACAINE LIPOSOME 1.3 % IJ SUSP
20.0000 mL | Freq: Once | INTRAMUSCULAR | Status: DC
  Filled 2017-06-18: qty 20

## 2017-06-18 MED ORDER — TRANEXAMIC ACID 1000 MG/10ML IV SOLN
1000.0000 mg | INTRAVENOUS | Status: AC
  Administered 2017-06-21: 1000 mg via INTRAVENOUS
  Filled 2017-06-18: qty 1100

## 2017-06-18 MED ORDER — CEFAZOLIN SODIUM-DEXTROSE 2-4 GM/100ML-% IV SOLN
2.0000 g | INTRAVENOUS | Status: AC
  Administered 2017-06-21: 2 g via INTRAVENOUS
  Filled 2017-06-18: qty 100

## 2017-06-18 NOTE — Pre-Procedure Instructions (Signed)
Teodora MediciMyra L Charpentier  06/18/2017      [facility] - [place], [place] - [id number] [address] [place] [id number] Phone: [phone] Fax: [phone]  [facility] - [place] - [address] [place] [address] Phone: [phone] Fax: [phone]    Your procedure is scheduled on 06/21/17  [facility].M.  Call this number if you have problems the morning of surgery:  [phone]   Remember:  Do not eat food or drink liquids after midnight.  Take these medicines the morning of surgery with A SIP OF WATER      Gabapentin,ranitidine(zantac),hydrocodone if needed,methacabamol if needed  STOP all herbel meds, nsaids (aleve,naproxen,advil,ibuprofen)   Starting today 06/18/17  Including all vitamins/supplements,aspirin, fish oil,indomethacin(indocin)   Do not wear jewelry, make-up or nail polish.  Do not wear lotions, powders, or perfumes, or deodorant.  Do not shave 48 hours prior to surgery.  Men may shave face and neck.  Do not bring valuables to the hospital.  [facility] is not responsible for any belongings or valuables.  Contacts, dentures or bridgework may not be worn into surgery.  Leave your suitcase in the car.  After surgery it may be brought to your room.  For patients admitted to the hospital, discharge time will be determined by your treatment team.  Patients discharged the day of surgery will not be allowed to drive home.   Special instructions:   Special Instructions:  - Preparing for Surgery  Before surgery, you can play an important role.  Because skin is not sterile, your skin needs to be as free of germs as possible.  You can reduce the number of germs on you skin by washing with CHG (chlorahexidine gluconate) soap before surgery.  CHG is an antiseptic cleaner which kills germs and bonds with the skin to continue killing germs even  after washing.  Please DO NOT use if you have an allergy to CHG or antibacterial soaps.  If your skin becomes reddened/irritated stop using the CHG and inform your nurse when you arrive at Short Stay.  Do not shave (including legs and underarms) for at least 48 hours prior to the first CHG shower.  You may shave your face.  Please follow these instructions carefully:   1.  Shower with CHG Soap the night before surgery and the morning of Surgery.  2.  If you choose to wash your hair, wash your hair first as usual with your normal shampoo.  3.  After you shampoo, rinse your hair and body thoroughly to remove the Shampoo.  4.  Use CHG as you would any other liquid soap.  You can apply chg directly  to the skin and wash gently with scrungie or a clean washcloth.  5.  Apply the CHG Soap to your body ONLY FROM THE NECK DOWN.  Do not use on open wounds or open sores.  Avoid contact with your eyes ears, mouth and genitals (private parts).  Wash genitals (private parts)       with your normal soap.  6.  Wash thoroughly, paying special attention to the area where your surgery will be performed.  7.  Thoroughly rinse your body with warm water from the neck down.  8.  DO NOT shower/wash with your normal soap after using and rinsing off the CHG Soap.  9.  Pat yourself dry  with a clean towel.            10.  Wear clean pajamas.            11.  Place clean sheets on your bed the night of your first shower and do not sleep with pets.  Day of Surgery  Do not apply any lotions/deodorants the morning of surgery.  Please wear clean clothes to the hospital/surgery center.  Please read over the  fact sheets that you were given.

## 2017-06-18 NOTE — Pre-Procedure Instructions (Signed)
Kathy MediciMyra L Coleman  06/18/2017      [facility] [place] - [phone] [address] [place] [address] Phone: [phone] Fax: [phone]  [facility] - [place] - [address] [place] [address] Phone: [phone] Fax: [phone]    Your procedure is scheduled on 06/21/17  [facility] at [facility].M.  Call this number if you have problems the morning of surgery:  [phone]   Remember:  Do not eat food or drink liquids after midnight.  Take these medicines the morning of surgery with A SIP OF WATER      Gabapentin,),hydrocodone if needed,methacabamol if needed  STOP all herbel meds, nsaids (aleve,naproxen,advil,ibuprofen)   Starting today 06/18/17  Including all vitamins/supplements,aspirin, fish oil,indomethacin(indocin)   Do not wear jewelry, make-up or nail polish.  Do not wear lotions, powders, or perfumes, or deodorant.  Do not shave 48 hours prior to surgery.  Men may shave face and neck.  Do not bring valuables to the hospital.  [facility] is not responsible for any belongings or valuables.  Contacts, dentures or bridgework may not be worn into surgery.  Leave your suitcase in the car.  After surgery it may be brought to your room.  For patients admitted to the hospital, discharge time will be determined by your treatment team.  Patients discharged the day of surgery will not be allowed to drive home.   Special instructions:   Special Instructions: [name] - Preparing for Surgery  Before surgery, you can play an important role.  Because skin is not sterile, your skin needs to be as free of germs as possible.  You can reduce the number of germs on you skin by washing with CHG (chlorahexidine gluconate) soap before surgery.  CHG is an antiseptic cleaner which kills germs and bonds with the skin to continue killing germs even after  washing.  Please DO NOT use if you have an allergy to CHG or antibacterial soaps.  If your skin becomes reddened/irritated stop using the CHG and inform your nurse when you arrive at Short Stay.  Do not shave (including legs and underarms) for at least 48 hours prior to the first CHG shower.  You may shave your face.  Please follow these instructions carefully:   1.  Shower with CHG Soap the night before surgery and the morning of Surgery.  2.  If you choose to wash your hair, wash your hair first as usual with your normal shampoo.  3.  After you shampoo, rinse your hair and body thoroughly to remove the Shampoo.  4.  Use CHG as you would any other liquid soap.  You can apply chg directly  to the skin and wash gently with scrungie or a clean washcloth.  5.  Apply the CHG Soap to your body ONLY FROM THE NECK DOWN.  Do not use on open wounds or open sores.  Avoid contact with your eyes ears, mouth and genitals (private parts).  Wash genitals (private parts)       with your normal soap.  6.  Wash thoroughly, paying special attention to the area where your surgery will be performed.  7.  Thoroughly rinse your body with warm water from the neck down.  8.  DO NOT shower/wash with your normal soap after using and rinsing off the CHG Soap.  9.  Pat yourself dry  with a clean towel.            10.  Wear clean pajamas.            11.  Place clean sheets on your bed the night of your first shower and do not sleep with pets.  Day of Surgery  Do not apply any lotions/deodorants the morning of surgery.  Please wear clean clothes to the hospital/surgery center.  Please read over the  fact sheets that you were given.

## 2017-06-20 NOTE — Anesthesia Preprocedure Evaluation (Addendum)
Anesthesia Evaluation  Patient identified by MRN, date of birth, ID band Patient awake    Reviewed: Allergy & Precautions, NPO status , Patient's Chart, lab work & pertinent test results  History of Anesthesia Complications (+) PONV and history of anesthetic complications  Airway Mallampati: II  TM Distance: >3 FB Neck ROM: Full    Dental  (+) Teeth Intact, Dental Advisory Given   Pulmonary former smoker,    breath sounds clear to auscultation       Cardiovascular hypertension, Pt. on medications  Rhythm:Regular Rate:Normal     Neuro/Psych  Neuromuscular disease    GI/Hepatic Neg liver ROS, GERD  Medicated and Controlled,  Endo/Other  negative endocrine ROS  Renal/GU negative Renal ROS     Musculoskeletal  (+) Arthritis , Back pain and HNP hx   Abdominal   Peds  Hematology   Anesthesia Other Findings   Reproductive/Obstetrics                           Anesthesia Physical Anesthesia Plan  ASA: III  Anesthesia Plan: General   Post-op Pain Management:    Induction: Intravenous  PONV Risk Score and Plan: 4 or greater and Treatment may vary due to age or medical condition, Dexamethasone and Ondansetron  Airway Management Planned: Oral ETT  Additional Equipment:   Intra-op Plan:   Post-operative Plan:   Informed Consent: I have reviewed the patients History and Physical, chart, labs and discussed the procedure including the risks, benefits and alternatives for the proposed anesthesia with the patient or authorized representative who has indicated his/her understanding and acceptance.     Plan Discussed with: CRNA  Anesthesia Plan Comments:         Anesthesia Quick Evaluation

## 2017-06-21 ENCOUNTER — Encounter (HOSPITAL_COMMUNITY): Payer: Self-pay | Admitting: Anesthesiology

## 2017-06-21 ENCOUNTER — Encounter (HOSPITAL_COMMUNITY)
Admission: RE | Disposition: A | Discharge status: Home Health | Payer: Self-pay | Source: Ambulatory Visit | Attending: Orthopedic Surgery

## 2017-06-21 ENCOUNTER — Other Ambulatory Visit: Payer: Self-pay

## 2017-06-21 ENCOUNTER — Inpatient Hospital Stay (HOSPITAL_COMMUNITY)
Admission: RE | Admit: 2017-06-21 | Discharge: 2017-06-23 | DRG: 470 | Disposition: A | Discharge status: Home Health | Payer: Medicare FFS | Source: Ambulatory Visit | Attending: Orthopedic Surgery | Admitting: Orthopedic Surgery

## 2017-06-21 ENCOUNTER — Inpatient Hospital Stay (HOSPITAL_COMMUNITY): Payer: Medicare FFS | Admitting: Anesthesiology

## 2017-06-21 DIAGNOSIS — Z8249 Family history of ischemic heart disease and other diseases of the circulatory system: Secondary | ICD-10-CM

## 2017-06-21 DIAGNOSIS — I10 Essential (primary) hypertension: Secondary | ICD-10-CM | POA: Diagnosis present

## 2017-06-21 DIAGNOSIS — K5900 Constipation, unspecified: Secondary | ICD-10-CM | POA: Diagnosis present

## 2017-06-21 DIAGNOSIS — Z96651 Presence of right artificial knee joint: Secondary | ICD-10-CM | POA: Diagnosis present

## 2017-06-21 DIAGNOSIS — Z87891 Personal history of nicotine dependence: Secondary | ICD-10-CM | POA: Diagnosis not present

## 2017-06-21 DIAGNOSIS — M25762 Osteophyte, left knee: Secondary | ICD-10-CM | POA: Diagnosis present

## 2017-06-21 DIAGNOSIS — Z96643 Presence of artificial hip joint, bilateral: Secondary | ICD-10-CM | POA: Diagnosis present

## 2017-06-21 DIAGNOSIS — M1712 Unilateral primary osteoarthritis, left knee: Secondary | ICD-10-CM | POA: Diagnosis present

## 2017-06-21 DIAGNOSIS — Z96652 Presence of left artificial knee joint: Secondary | ICD-10-CM | POA: Diagnosis not present

## 2017-06-21 DIAGNOSIS — Z79899 Other long term (current) drug therapy: Secondary | ICD-10-CM | POA: Diagnosis not present

## 2017-06-21 DIAGNOSIS — Z90710 Acquired absence of both cervix and uterus: Secondary | ICD-10-CM | POA: Diagnosis not present

## 2017-06-21 DIAGNOSIS — Z888 Allergy status to other drugs, medicaments and biological substances status: Secondary | ICD-10-CM | POA: Diagnosis not present

## 2017-06-21 DIAGNOSIS — D62 Acute posthemorrhagic anemia: Secondary | ICD-10-CM | POA: Diagnosis not present

## 2017-06-21 DIAGNOSIS — E785 Hyperlipidemia, unspecified: Secondary | ICD-10-CM | POA: Diagnosis present

## 2017-06-21 DIAGNOSIS — Z9049 Acquired absence of other specified parts of digestive tract: Secondary | ICD-10-CM | POA: Diagnosis not present

## 2017-06-21 DIAGNOSIS — K219 Gastro-esophageal reflux disease without esophagitis: Secondary | ICD-10-CM | POA: Diagnosis present

## 2017-06-21 HISTORY — DX: Unilateral primary osteoarthritis, left knee: M17.12

## 2017-06-21 HISTORY — PX: TOTAL KNEE ARTHROPLASTY: SHX125

## 2017-06-21 SURGERY — ARTHROPLASTY, KNEE, TOTAL
Anesthesia: General | Site: Knee | Laterality: Left

## 2017-06-21 MED ORDER — HYDROMORPHONE HCL 1 MG/ML IJ SOLN
INTRAMUSCULAR | Status: AC
  Administered 2017-06-21: 0.5 mg via INTRAVENOUS
  Filled 2017-06-21: qty 1

## 2017-06-21 MED ORDER — DOCUSATE SODIUM 100 MG PO CAPS
100.0000 mg | ORAL_CAPSULE | Freq: Two times a day (BID) | ORAL | Status: DC
  Administered 2017-06-21 (×2): 100 mg via ORAL
  Filled 2017-06-21 (×3): qty 1

## 2017-06-21 MED ORDER — ONDANSETRON HCL 4 MG/2ML IJ SOLN
INTRAMUSCULAR | Status: AC
  Filled 2017-06-21: qty 2

## 2017-06-21 MED ORDER — FLEET ENEMA 7-19 GM/118ML RE ENEM: 1.0000 | ENEMA | Freq: Once | RECTAL | Status: DC | PRN

## 2017-06-21 MED ORDER — ROCURONIUM BROMIDE 10 MG/ML (PF) SYRINGE
PREFILLED_SYRINGE | INTRAVENOUS | Status: AC
  Filled 2017-06-21: qty 5

## 2017-06-21 MED ORDER — LISINOPRIL 20 MG PO TABS
20.0000 mg | ORAL_TABLET | Freq: Every day | ORAL | Status: DC
Start: 2017-06-22 — End: 2017-06-23
  Administered 2017-06-22 – 2017-06-23 (×2): 20 mg via ORAL
  Filled 2017-06-21 (×2): qty 1

## 2017-06-21 MED ORDER — ACETAMINOPHEN 650 MG RE SUPP: 650.0000 mg | RECTAL | Status: DC | PRN

## 2017-06-21 MED ORDER — SUGAMMADEX SODIUM 200 MG/2ML IV SOLN
INTRAVENOUS | Status: DC | PRN
  Administered 2017-06-21: 150 mg via INTRAVENOUS

## 2017-06-21 MED ORDER — METHOCARBAMOL 1000 MG/10ML IJ SOLN
500.0000 mg | Freq: Four times a day (QID) | INTRAMUSCULAR | Status: DC | PRN
  Filled 2017-06-21: qty 5

## 2017-06-21 MED ORDER — OXYCODONE HCL 5 MG/5ML PO SOLN: 5.0000 mg | Freq: Once | ORAL | Status: DC | PRN

## 2017-06-21 MED ORDER — SODIUM CHLORIDE 0.9 % IR SOLN
Status: DC | PRN
  Administered 2017-06-21: 3000 mL

## 2017-06-21 MED ORDER — SODIUM CHLORIDE 0.9 % IJ SOLN
INTRAMUSCULAR | Status: DC | PRN
  Administered 2017-06-21: 50 mL

## 2017-06-21 MED ORDER — METHOCARBAMOL 500 MG PO TABS
500.0000 mg | ORAL_TABLET | Freq: Four times a day (QID) | ORAL | Status: DC | PRN
  Administered 2017-06-21 – 2017-06-23 (×7): 500 mg via ORAL
  Filled 2017-06-21 (×7): qty 1

## 2017-06-21 MED ORDER — FENTANYL CITRATE (PF) 250 MCG/5ML IJ SOLN
INTRAMUSCULAR | Status: AC
  Filled 2017-06-21: qty 5

## 2017-06-21 MED ORDER — ONDANSETRON HCL 4 MG PO TABS: 4.0000 mg | ORAL_TABLET | Freq: Four times a day (QID) | ORAL | Status: DC | PRN

## 2017-06-21 MED ORDER — METOCLOPRAMIDE HCL 5 MG/ML IJ SOLN: 5.0000 mg | Freq: Three times a day (TID) | INTRAMUSCULAR | Status: DC | PRN

## 2017-06-21 MED ORDER — OXYCODONE HCL 5 MG PO TABS: 5.0000 mg | ORAL_TABLET | Freq: Once | ORAL | Status: DC | PRN

## 2017-06-21 MED ORDER — MORPHINE SULFATE (PF) 4 MG/ML IV SOLN
INTRAVENOUS | Status: AC
Start: 2017-06-21 — End: 2017-06-21
  Filled 2017-06-21: qty 1

## 2017-06-21 MED ORDER — LIDOCAINE 2% (20 MG/ML) 5 ML SYRINGE
INTRAMUSCULAR | Status: AC
  Filled 2017-06-21: qty 5

## 2017-06-21 MED ORDER — PROPOFOL 10 MG/ML IV BOLUS
INTRAVENOUS | Status: DC | PRN
  Administered 2017-06-21: 160 mg via INTRAVENOUS

## 2017-06-21 MED ORDER — OXYCODONE HCL 5 MG PO TABS
5.0000 mg | ORAL_TABLET | ORAL | Status: DC | PRN
  Administered 2017-06-21 (×2): 5 mg via ORAL
  Filled 2017-06-21 (×2): qty 1

## 2017-06-21 MED ORDER — ONDANSETRON HCL 4 MG/2ML IJ SOLN
INTRAMUSCULAR | Status: DC | PRN
  Administered 2017-06-21: 4 mg via INTRAVENOUS

## 2017-06-21 MED ORDER — ALUM & MAG HYDROXIDE-SIMETH 200-200-20 MG/5ML PO SUSP: 30.0000 mL | ORAL | Status: DC | PRN

## 2017-06-21 MED ORDER — CEFUROXIME SODIUM 1.5 G IV SOLR
INTRAVENOUS | Status: AC
  Filled 2017-06-21: qty 1.5

## 2017-06-21 MED ORDER — TRANEXAMIC ACID 1000 MG/10ML IV SOLN
INTRAVENOUS | Status: AC | PRN
  Administered 2017-06-21: 2000 mg via TOPICAL

## 2017-06-21 MED ORDER — ASPIRIN EC 325 MG PO TBEC
325.0000 mg | DELAYED_RELEASE_TABLET | Freq: Every day | ORAL | Status: DC
  Administered 2017-06-22 – 2017-06-23 (×2): 325 mg via ORAL
  Filled 2017-06-21 (×2): qty 1

## 2017-06-21 MED ORDER — PHENOL 1.4 % MT LIQD: 1.0000 | OROMUCOSAL | Status: DC | PRN

## 2017-06-21 MED ORDER — ASPIRIN EC 325 MG PO TBEC: 325.0000 mg | DELAYED_RELEASE_TABLET | Freq: Two times a day (BID) | ORAL | 0 refills | Status: DC

## 2017-06-21 MED ORDER — POLYETHYLENE GLYCOL 3350 17 G PO PACK
17.0000 g | PACK | Freq: Two times a day (BID) | ORAL | Status: DC
  Administered 2017-06-21 – 2017-06-23 (×3): 17 g via ORAL
  Filled 2017-06-21 (×4): qty 1

## 2017-06-21 MED ORDER — ROCURONIUM BROMIDE 100 MG/10ML IV SOLN
INTRAVENOUS | Status: DC | PRN
  Administered 2017-06-21: 50 mg via INTRAVENOUS

## 2017-06-21 MED ORDER — GABAPENTIN 300 MG PO CAPS
300.0000 mg | ORAL_CAPSULE | Freq: Three times a day (TID) | ORAL | Status: DC
  Administered 2017-06-21 – 2017-06-23 (×7): 300 mg via ORAL
  Filled 2017-06-21 (×7): qty 1

## 2017-06-21 MED ORDER — HYDROMORPHONE HCL 1 MG/ML IJ SOLN
0.5000 mg | INTRAMUSCULAR | Status: DC | PRN
  Administered 2017-06-21 – 2017-06-23 (×6): 0.5 mg via INTRAVENOUS
  Filled 2017-06-21 (×6): qty 1

## 2017-06-21 MED ORDER — METHOCARBAMOL 500 MG PO TABS
ORAL_TABLET | ORAL | Status: AC
  Filled 2017-06-21: qty 1

## 2017-06-21 MED ORDER — MORPHINE SULFATE (PF) 4 MG/ML IV SOLN
4.0000 mg | INTRAVENOUS | Status: DC | PRN
  Administered 2017-06-21: 4 mg via INTRAVENOUS

## 2017-06-21 MED ORDER — SUGAMMADEX SODIUM 200 MG/2ML IV SOLN
INTRAVENOUS | Status: AC
  Filled 2017-06-21: qty 4

## 2017-06-21 MED ORDER — MIDAZOLAM HCL 2 MG/2ML IJ SOLN
1.0000 mg | Freq: Once | INTRAMUSCULAR | Status: AC
  Administered 2017-06-21: 1 mg via INTRAVENOUS

## 2017-06-21 MED ORDER — BISACODYL 5 MG PO TBEC: 5.0000 mg | DELAYED_RELEASE_TABLET | Freq: Every day | ORAL | Status: DC | PRN

## 2017-06-21 MED ORDER — DEXAMETHASONE SODIUM PHOSPHATE 10 MG/ML IJ SOLN
INTRAMUSCULAR | Status: DC | PRN
  Administered 2017-06-21: 10 mg via INTRAVENOUS

## 2017-06-21 MED ORDER — MIDAZOLAM HCL 2 MG/2ML IJ SOLN
INTRAMUSCULAR | Status: AC
  Administered 2017-06-21: 1 mg via INTRAVENOUS
  Filled 2017-06-21: qty 2

## 2017-06-21 MED ORDER — PROPOFOL 10 MG/ML IV BOLUS
INTRAVENOUS | Status: AC
  Filled 2017-06-21: qty 20

## 2017-06-21 MED ORDER — TRANEXAMIC ACID 1000 MG/10ML IV SOLN
1000.0000 mg | Freq: Once | INTRAVENOUS | Status: AC
  Administered 2017-06-21: 1000 mg via INTRAVENOUS
  Filled 2017-06-21: qty 10

## 2017-06-21 MED ORDER — CHLORHEXIDINE GLUCONATE 4 % EX LIQD: 60.0000 mL | Freq: Once | CUTANEOUS | Status: DC

## 2017-06-21 MED ORDER — METOCLOPRAMIDE HCL 5 MG PO TABS: 5.0000 mg | ORAL_TABLET | Freq: Three times a day (TID) | ORAL | Status: DC | PRN

## 2017-06-21 MED ORDER — FENTANYL CITRATE (PF) 100 MCG/2ML IJ SOLN
100.0000 ug | Freq: Once | INTRAMUSCULAR | Status: AC
  Administered 2017-06-21: 100 ug via INTRAVENOUS

## 2017-06-21 MED ORDER — BUPIVACAINE LIPOSOME 1.3 % IJ SUSP
INTRAMUSCULAR | Status: DC | PRN
  Administered 2017-06-21: 20 mL

## 2017-06-21 MED ORDER — ONDANSETRON HCL 4 MG/2ML IJ SOLN: 4.0000 mg | Freq: Four times a day (QID) | INTRAMUSCULAR | Status: DC | PRN

## 2017-06-21 MED ORDER — ACETAMINOPHEN 325 MG PO TABS
650.0000 mg | ORAL_TABLET | ORAL | Status: DC | PRN
  Administered 2017-06-21 – 2017-06-23 (×5): 650 mg via ORAL
  Filled 2017-06-21 (×5): qty 2

## 2017-06-21 MED ORDER — GABAPENTIN 300 MG PO CAPS: 300.0000 mg | ORAL_CAPSULE | Freq: Three times a day (TID) | ORAL | Status: DC

## 2017-06-21 MED ORDER — OXYCODONE HCL 5 MG PO TABS
ORAL_TABLET | ORAL | Status: AC
Start: 2017-06-21 — End: 2017-06-21
  Filled 2017-06-21: qty 2

## 2017-06-21 MED ORDER — CELECOXIB 200 MG PO CAPS
200.0000 mg | ORAL_CAPSULE | Freq: Two times a day (BID) | ORAL | Status: DC
  Administered 2017-06-21 – 2017-06-23 (×4): 200 mg via ORAL
  Filled 2017-06-21 (×4): qty 1

## 2017-06-21 MED ORDER — OXYCODONE HCL 5 MG PO TABS
10.0000 mg | ORAL_TABLET | ORAL | Status: DC | PRN
  Administered 2017-06-21 – 2017-06-23 (×13): 10 mg via ORAL
  Filled 2017-06-21 (×12): qty 2

## 2017-06-21 MED ORDER — LIDOCAINE HCL (CARDIAC) 20 MG/ML IV SOLN
INTRAVENOUS | Status: DC | PRN
  Administered 2017-06-21: 70 mg via INTRAVENOUS

## 2017-06-21 MED ORDER — SENNOSIDES-DOCUSATE SODIUM 8.6-50 MG PO TABS: 1.0000 | ORAL_TABLET | Freq: Every evening | ORAL | Status: DC | PRN

## 2017-06-21 MED ORDER — KCL IN DEXTROSE-NACL 20-5-0.45 MEQ/L-%-% IV SOLN
INTRAVENOUS | Status: DC
  Administered 2017-06-21 (×2): via INTRAVENOUS
  Filled 2017-06-21 (×4): qty 1000

## 2017-06-21 MED ORDER — HYDROMORPHONE HCL 1 MG/ML IJ SOLN
0.2500 mg | INTRAMUSCULAR | Status: DC | PRN
  Administered 2017-06-21 (×4): 0.5 mg via INTRAVENOUS

## 2017-06-21 MED ORDER — DIPHENHYDRAMINE HCL 12.5 MG/5ML PO ELIX: 12.5000 mg | ORAL_SOLUTION | ORAL | Status: DC | PRN

## 2017-06-21 MED ORDER — DEXAMETHASONE SODIUM PHOSPHATE 10 MG/ML IJ SOLN
INTRAMUSCULAR | Status: AC
  Filled 2017-06-21: qty 1

## 2017-06-21 MED ORDER — 0.9 % SODIUM CHLORIDE (POUR BTL) OPTIME
TOPICAL | Status: DC | PRN
  Administered 2017-06-21: 1000 mL

## 2017-06-21 MED ORDER — BUPIVACAINE-EPINEPHRINE 0.25% -1:200000 IJ SOLN
INTRAMUSCULAR | Status: AC
  Filled 2017-06-21: qty 1

## 2017-06-21 MED ORDER — MIDAZOLAM HCL 2 MG/2ML IJ SOLN
INTRAMUSCULAR | Status: AC
  Filled 2017-06-21: qty 2

## 2017-06-21 MED ORDER — MENTHOL 3 MG MT LOZG: 1.0000 | LOZENGE | OROMUCOSAL | Status: DC | PRN

## 2017-06-21 MED ORDER — OXYCODONE-ACETAMINOPHEN 5-325 MG PO TABS: 1.0000 | ORAL_TABLET | ORAL | 0 refills | Status: DC | PRN

## 2017-06-21 MED ORDER — FENTANYL CITRATE (PF) 100 MCG/2ML IJ SOLN
INTRAMUSCULAR | Status: DC | PRN
  Administered 2017-06-21 (×5): 50 ug via INTRAVENOUS

## 2017-06-21 MED ORDER — FAMOTIDINE 20 MG PO TABS
20.0000 mg | ORAL_TABLET | Freq: Every day | ORAL | Status: DC
  Administered 2017-06-22 – 2017-06-23 (×2): 20 mg via ORAL
  Filled 2017-06-21 (×2): qty 1

## 2017-06-21 MED ORDER — BUPIVACAINE-EPINEPHRINE (PF) 0.25% -1:200000 IJ SOLN
INTRAMUSCULAR | Status: DC | PRN
  Administered 2017-06-21: 50 mL

## 2017-06-21 MED ORDER — FENTANYL CITRATE (PF) 100 MCG/2ML IJ SOLN
INTRAMUSCULAR | Status: AC
  Administered 2017-06-21: 100 ug via INTRAVENOUS
  Filled 2017-06-21: qty 2

## 2017-06-21 SURGICAL SUPPLY — 51 items
BANDAGE ESMARK 6X9 LF (GAUZE/BANDAGES/DRESSINGS) ×1 IMPLANT
BLADE SAG 18X100X1.27 (BLADE) ×3 IMPLANT
BLADE SAGITTAL 13X1.27X60 (BLADE) ×2 IMPLANT
BLADE SAGITTAL 13X1.27X60MM (BLADE) ×1
BLADE SAW SGTL 13X75X1.27 (BLADE) IMPLANT
BNDG ELASTIC 6X10 VLCR STRL LF (GAUZE/BANDAGES/DRESSINGS) ×3 IMPLANT
BNDG ESMARK 6X9 LF (GAUZE/BANDAGES/DRESSINGS) ×3
BOWL SMART MIX CTS (DISPOSABLE) ×3 IMPLANT
CAPT KNEE TOTAL 3 ATTUNE ×3 IMPLANT
CEMENT HV SMART SET (Cement) ×6 IMPLANT
COVER SURGICAL LIGHT HANDLE (MISCELLANEOUS) ×3 IMPLANT
CUFF TOURNIQUET SINGLE 34IN LL (TOURNIQUET CUFF) ×3 IMPLANT
CUFF TOURNIQUET SINGLE 44IN (TOURNIQUET CUFF) IMPLANT
DRAPE EXTREMITY T 121X128X90 (DRAPE) ×3 IMPLANT
DRAPE U-SHAPE 47X51 STRL (DRAPES) ×3 IMPLANT
DRSG AQUACEL AG ADV 3.5X10 (GAUZE/BANDAGES/DRESSINGS) ×3 IMPLANT
DURAPREP 26ML APPLICATOR (WOUND CARE) ×3 IMPLANT
ELECT REM PT RETURN 9FT ADLT (ELECTROSURGICAL) ×3
ELECTRODE REM PT RTRN 9FT ADLT (ELECTROSURGICAL) ×1 IMPLANT
GLOVE BIO SURGEON STRL SZ7.5 (GLOVE) ×3 IMPLANT
GLOVE BIO SURGEON STRL SZ8.5 (GLOVE) ×3 IMPLANT
GLOVE BIOGEL PI IND STRL 8 (GLOVE) ×1 IMPLANT
GLOVE BIOGEL PI IND STRL 9 (GLOVE) ×1 IMPLANT
GLOVE BIOGEL PI INDICATOR 8 (GLOVE) ×2
GLOVE BIOGEL PI INDICATOR 9 (GLOVE) ×2
GOWN STRL REUS W/ TWL LRG LVL3 (GOWN DISPOSABLE) ×1 IMPLANT
GOWN STRL REUS W/ TWL XL LVL3 (GOWN DISPOSABLE) ×3 IMPLANT
GOWN STRL REUS W/TWL LRG LVL3 (GOWN DISPOSABLE) ×2
GOWN STRL REUS W/TWL XL LVL3 (GOWN DISPOSABLE) ×6
HANDPIECE INTERPULSE COAX TIP (DISPOSABLE) ×2
HOOD PEEL AWAY FACE SHEILD DIS (HOOD) ×9 IMPLANT
KIT BASIN OR (CUSTOM PROCEDURE TRAY) ×3 IMPLANT
KIT ROOM TURNOVER OR (KITS) ×3 IMPLANT
MANIFOLD NEPTUNE II (INSTRUMENTS) ×3 IMPLANT
NEEDLE 22X1 1/2 (OR ONLY) (NEEDLE) ×6 IMPLANT
NS IRRIG 1000ML POUR BTL (IV SOLUTION) ×3 IMPLANT
PACK TOTAL JOINT (CUSTOM PROCEDURE TRAY) ×3 IMPLANT
PAD ARMBOARD 7.5X6 YLW CONV (MISCELLANEOUS) ×6 IMPLANT
SET HNDPC FAN SPRY TIP SCT (DISPOSABLE) ×1 IMPLANT
SUT VIC AB 0 CT1 27 (SUTURE) ×2
SUT VIC AB 0 CT1 27XBRD ANBCTR (SUTURE) ×1 IMPLANT
SUT VIC AB 1 CTX 36 (SUTURE) ×2
SUT VIC AB 1 CTX36XBRD ANBCTR (SUTURE) ×1 IMPLANT
SUT VIC AB 2-0 CT1 27 (SUTURE) ×2
SUT VIC AB 2-0 CT1 TAPERPNT 27 (SUTURE) ×1 IMPLANT
SUT VIC AB 3-0 CT1 27 (SUTURE) ×2
SUT VIC AB 3-0 CT1 TAPERPNT 27 (SUTURE) ×1 IMPLANT
SYR CONTROL 10ML LL (SYRINGE) ×6 IMPLANT
TOWEL OR 17X24 6PK STRL BLUE (TOWEL DISPOSABLE) ×3 IMPLANT
TOWEL OR 17X26 10 PK STRL BLUE (TOWEL DISPOSABLE) ×3 IMPLANT
TRAY CATH 16FR W/PLASTIC CATH (SET/KITS/TRAYS/PACK) ×3 IMPLANT

## 2017-06-21 NOTE — Evaluation (Signed)
Physical Therapy Evaluation Patient Details Name: Kathy MediciMyra L Coleman MRN: [id number] DOB: August 16, 1955 Today's Date: 06/21/2017   History of Present Illness  Pt is a 61 y/o female s/p elective L TKA. PMH includes HTN, OA, back surgery, bilat THA, and R TKA.   Clinical Impression  Pt is s/p surgery above with deficits below. PTA, pt was independent with functional mobility. Upon eval, pt presenting with post op pain and weakness. Pt required min guard assist for gait with use of RW. Distance limited secondary to pain. Reports husband will be able to assist as needed upon d/c home. Will need DME below. Reports she will be getting HHPT at d/c to address mobility deficits. Will continue to follow acutely to maximize functional mobility independence and safety.     Follow Up Recommendations DC plan and follow up therapy as arranged by surgeon;Home health PT;Supervision for mobility/OOB    Equipment Recommendations  3in1 (PT)    Recommendations for Other Services       Precautions / Restrictions Precautions Precautions: Knee Precaution Booklet Issued: Yes (comment) Precaution Comments: Reviewed supine ther ex verbally. Pt in increased pain and requesting to hold ther ex.  Restrictions Weight Bearing Restrictions: Yes LLE Weight Bearing: Weight bearing as tolerated      Mobility  Bed Mobility Overal bed mobility: Needs Assistance Bed Mobility: Sit to Supine       Sit to supine: Min assist   General bed mobility comments: Min A for LLE lift assist for return to supine.   Transfers Overall transfer level: Needs assistance Equipment used: Rolling walker (2 wheeled) Transfers: Sit to/from Stand Sit to Stand: Min guard         General transfer comment: Min guard for safety. Verbal cues for safe hand placement.   Ambulation/Gait Ambulation/Gait assistance: Min guard Ambulation Distance (Feet): 40 Feet Assistive device: Rolling walker (2 wheeled) Gait Pattern/deviations:  Step-through pattern;Decreased step length - left;Decreased step length - right;Decreased weight shift to left;Antalgic Gait velocity: Decreased  Gait velocity interpretation: Below normal speed for age/gender General Gait Details: Slow, antalgic gait secondary to post op pain and weakness. Verbal cues for sequencing using RW. Distance limited secondary to pain.   Stairs            Wheelchair Mobility    Modified Rankin (Stroke Patients Only)       Balance Overall balance assessment: Needs assistance Sitting-balance support: No upper extremity supported;Feet supported Sitting balance-Leahy Scale: Good     Standing balance support: Bilateral upper extremity supported;During functional activity Standing balance-Leahy Scale: Poor Standing balance comment: Reliant on BUE support for balance.                              Pertinent Vitals/Pain Pain Assessment: 0-10 Pain Score: 8  Pain Location: L knee  Pain Descriptors / Indicators: Aching;Operative site guarding Pain Intervention(s): Limited activity within patient's tolerance;Monitored during session;Repositioned    Home Living Family/patient expects to be discharged to:: Private residence Living Arrangements: Spouse/significant other Available Help at Discharge: Family;Available 24 hours/day Type of Home: House Home Access: Level entry     Home Layout: One level Home Equipment: Walker - 2 wheels;Cane - single point;Shower seat;Grab bars - tub/shower      Prior Function Level of Independence: Independent with assistive device(s)         Comments: Used cane and walker on her bad days      Hand Dominance   Dominant  Hand: Right    Extremity/Trunk Assessment   Upper Extremity Assessment Upper Extremity Assessment: Defer to OT evaluation    Lower Extremity Assessment Lower Extremity Assessment: LLE deficits/detail LLE Deficits / Details: Sensory in tact. Deficits consistent with post op pain  and weakness.    Cervical / Trunk Assessment Cervical / Trunk Assessment: Normal  Communication   Communication: No difficulties  Cognition Arousal/Alertness: Awake/alert Behavior During Therapy: WFL for tasks assessed/performed Overall Cognitive Status: Within Functional Limits for tasks assessed                                        General Comments General comments (skin integrity, edema, etc.): Pt's husband present during session     Exercises Total Joint Exercises Ankle Circles/Pumps: AROM;Both;10 reps   Assessment/Plan    PT Assessment Patient needs continued PT services  PT Problem List Decreased strength;Decreased range of motion;Decreased activity tolerance;Decreased balance;Decreased mobility;Decreased knowledge of use of DME;Pain       PT Treatment Interventions Gait training;DME instruction;Functional mobility training;Therapeutic activities;Balance training;Therapeutic exercise;Neuromuscular re-education;Patient/family education    PT Goals (Current goals can be found in the Care Plan section)  Acute Rehab PT Goals Patient Stated Goal: to decrease pain  PT Goal Formulation: With patient Time For Goal Achievement: 06/28/17 Potential to Achieve Goals: Good    Frequency 7X/week   Barriers to discharge        Co-evaluation               AM-PAC PT "6 Clicks" Daily Activity  Outcome Measure Difficulty turning over in bed (including adjusting bedclothes, sheets and blankets)?: A Little Difficulty moving from lying on back to sitting on the side of the bed? : Unable Difficulty sitting down on and standing up from a chair with arms (e.g., wheelchair, bedside commode, etc,.)?: Unable Help needed moving to and from a bed to chair (including a wheelchair)?: A Little Help needed walking in hospital room?: A Little Help needed climbing 3-5 steps with a railing? : A Little 6 Click Score: 14    End of Session Equipment Utilized During  Treatment: Gait belt Activity Tolerance: Patient limited by pain Patient left: in bed;with call bell/phone within reach;with family/visitor present Nurse Communication: Mobility status;Patient requests pain meds PT Visit Diagnosis: Other abnormalities of gait and mobility (R26.89);Pain Pain - Right/Left: Left Pain - part of body: Knee    Time: [id number] PT Time Calculation (min) (ACUTE ONLY): 21 min   Charges:   PT Evaluation $PT Eval Low Complexity: 1 Low PT Treatments $Gait Training: 8-22 mins   PT G Codes:        Gladys DammeBrittany , PT, DPT  Acute Rehabilitation Services  Pager: [phone]   Lehman PromBrittany S  06/21/2017, 3:43 PM

## 2017-06-21 NOTE — Transfer of Care (Signed)
Immediate Anesthesia Transfer of Care Note  Patient: Kathy Coleman  Procedure(s) Performed: LEFT TOTAL KNEE ARTHROPLASTY (Left Knee)  Patient Location: PACU  Anesthesia Type:General  Level of Consciousness: awake, oriented and patient cooperative  Airway & Oxygen Therapy: Patient Spontanous Breathing and Patient connected to nasal cannula oxygen  Post-op Assessment: Report given to RN and Post -op Vital signs reviewed and stable  Post vital signs: Reviewed  Last Vitals:  Vitals:   06/21/17 0851 06/21/17 0855  BP: (!) 155/65 (!) 137/59  Pulse: 91 85  Resp: 17 (!) 22  Temp:    SpO2: 100% 100%    Last Pain:  Vitals:   06/21/17 0855  TempSrc:   PainSc: 0-No pain      Patients Stated Pain Goal: 3 (06/21/17 0855)  Complications: No apparent anesthesia complications

## 2017-06-21 NOTE — Progress Notes (Signed)
Orthopedic Tech Progress Note Patient Details:  Teodora MediciMyra L Voorheis [birth year] [id number]  Ortho Devices Type of Ortho Device: (footsie roll) Ortho Device/Splint Location: lle Ortho Device/Splint Interventions: Application   Post Interventions Patient Tolerated: Well Instructions Provided: Care of device   Nikki DomCrawford,  06/21/2017, 1:13 PM

## 2017-06-21 NOTE — Anesthesia Procedure Notes (Signed)
Procedure Name: Intubation Date/Time: 06/21/2017 9:14 AM Performed by: Lovie Cholock,  K, CRNA Pre-anesthesia Checklist: Patient identified, Emergency Drugs available, Suction available and Patient being monitored Patient Re-evaluated:Patient Re-evaluated prior to induction Oxygen Delivery Method: Circle System Utilized Preoxygenation: Pre-oxygenation with 100% oxygen Induction Type: IV induction Ventilation: Mask ventilation without difficulty Laryngoscope Size: Miller and 2 Grade View: Grade I Tube type: Oral Tube size: 7.0 mm Number of attempts: 1 Airway Equipment and Method: Stylet and Oral airway Placement Confirmation: ETT inserted through vocal cords under direct vision,  positive ETCO2 and breath sounds checked- equal and bilateral Secured at: 21 cm Tube secured with: Tape Dental Injury: Teeth and Oropharynx as per pre-operative assessment

## 2017-06-21 NOTE — Op Note (Signed)
PATIENT ID:      Kathy Coleman  MRN:     [id number] DOB/AGE:    62-Sep-1957 / 62 y.o.       OPERATIVE REPORT    DATE OF PROCEDURE:  06/21/2017       PREOPERATIVE DIAGNOSIS:   LEFT KNEE OSTEOARTHRITIS      Estimated body mass index is 29.12 kg/m as calculated from the following:   Height as of this encounter:  (1.651 m).   Weight as of this encounter: 175 lb (79.4 kg).                                                        POSTOPERATIVE DIAGNOSIS:   LEFT KNEE OSTEOARTHRITIS                                                                      PROCEDURE:  Procedure(s): LEFT TOTAL KNEE ARTHROPLASTY Using DepuyAttune RP implants #5L Femur, #6Tibia, 5 mm Attune RP bearing, 38 Patella     SURGEON: [name]    ASSISTANT:   Tomi Likens. [employer]   (Present and scrubbed throughout the case, critical for assistance with exposure, retraction, instrumentation, and closure.)         ANESTHESIA: Spinal, 20cc Exparel, 50cc 0.25% Marcaine  EBL: 300  FLUID REPLACEMENT: 1600 crystalloid  TOURNIQUET TIME:  Drains: None  Tranexamic Acid: 1gm IV, 2gm topical  COMPLICATIONS:  None         INDICATIONS FOR PROCEDURE: The patient has  LEFT KNEE OSTEOARTHRITIS, Val deformities, XR shows bone on bone arthritis, lateral subluxation of tibia. Patient has failed all conservative measures including anti-inflammatory medicines, narcotics, attempts at  exercise and weight loss, cortisone injections and viscosupplementation.  Risks and benefits of surgery have been discussed, questions answered.   DESCRIPTION OF PROCEDURE: The patient identified by armband, received  IV antibiotics, in the holding area at [facility]. Patient taken to the operating room, appropriate anesthetic  monitors were attached, and Spinal anesthesia was  induced. Tourniquet  applied high to the operative thigh. Lateral post and foot positioner  applied to the table, the lower extremity was then prepped and draped  in  usual sterile fashion from the toes to the tourniquet. Time-out procedure was performed. We began the operation, with the knee flexed 120 degrees, by making the anterior midline incision starting at handbreadth above the patella going over the patella 1 cm medial to and 4 cm distal to the tibial tubercle. Small bleeders in the skin and the  subcutaneous tissue identified and cauterized. Transverse retinaculum was incised and reflected medially and a medial parapatellar arthrotomy was accomplished. the patella was everted and theprepatellar fat pad resected. The superficial medial collateral  ligament was then elevated from anterior to posterior along the proximal  flare of the tibia and anterior half of the menisci resected. The knee was hyperflexed exposing bone on bone arthritis. Peripheral and notch osteophytes as well as the cruciate ligaments were then resected. We continued to  work our way around posteriorly along the  proximal tibia, and externally  rotated the tibia subluxing it out from underneath the femur. A McHale  retractor was placed through the notch and a lateral Hohmann retractor  placed, and we then drilled through the proximal tibia in line with the  axis of the tibia followed by an intramedullary guide rod and 2-degree  posterior slope cutting guide. The tibial cutting guide, 3 degree posterior sloped, was pinned into place allowing resection of 6 mm of bone medially and 0 mm of bone laterally. Satisfied with the tibial resection, we then  entered the distal femur 2 mm anterior to the PCL origin with the  intramedullary guide rod and applied the distal femoral cutting guide  set at 9 mm, with 6 degrees of valgus. This was pinned along the  epicondylar axis. At this point, the distal femoral cut was accomplished without difficulty. We then sized for a #5L femoral component and pinned the guide in 0 degrees of external rotation. The chamfer cutting guide was pinned into place. The  anterior, posterior, and chamfer cuts were accomplished without difficulty followed by  the Attune RP box cutting guide and the box cut. We also removed posterior osteophytes from the posterior femoral condyles. At this  time, the knee was brought into full extension. We checked our  extension and flexion gaps and found them symmetric for a 5 mm bearing. Distracting in extension with a lamina spreader, the posterior horns of the menisci were removed, and Exparel, diluted to 60 cc, with 20cc NS, and 20cc 0.5% Marcaine,was injected into the capsule and synovium of the knee. The posterior patella cut was accomplished with the 9.5 mm Attune cutting guide, sized for a 38mm dome, and the fixation pegs drilled.The knee  was then once again hyperflexed exposing the proximal tibia. We sized for a # 6 tibial base plate, applied the smokestack and the conical reamer followed by the the Delta fin keel punch. We then hammered into place the Attune RP trial femoral component, drilled the lugs, inserted a  5 mm trial bearing, trial patellar button, and took the knee through range of motion from 0-130 degrees. No thumb pressure was required for patellar Tracking. At this point, the limb was wrapped with an Esmarch bandage and the tourniquet inflated to 350 mmHg. All trial components were removed, mating surfaces irrigated with pulse lavage, and dried with suction and sponges. 10 cc of the Exparel solution was applied to the cancellus bone of the patella distal femur and proximal tibia.  After waiting 1 minute, the bony surfaces were again, dried with sponges. A double batch of DePuy HV cement with 1500 mg of Zinacef was mixed and applied to all bony metallic mating surfaces except for the posterior condyles of the femur itself. In order, we hammered into place the tibial tray and removed excess cement, the femoral component and removed excess cement. The final Attune RP bearing  was inserted, and the knee brought to full  extension with compression.  The patellar button was clamped into place, and excess cement  removed. While the cement cured the wound was irrigated out with normal saline solution pulse lavage. Ligament stability and patellar tracking were checked and found to be excellent. The parapatellar arthrotomy was closed with  running #1 Vicryl suture. The subcutaneous tissue with 0 and 2-0 undyed  Vicryl suture, and the skin with running 3-0 SQ vicryl. A dressing of Xeroform,  4 x 4, dressing sponges, Webril, and Ace wrap applied. The patient  awakened,  and taken to recovery room without difficulty.   [name] 06/21/2017, 10:44 AM

## 2017-06-21 NOTE — Plan of Care (Signed)
  Elimination: Will not experience complications related to bowel motility 06/21/2017 1419 - Progressing by Darrow BussingArcilla,  M, RN   Pain Managment: General experience of comfort will improve 06/21/2017 1419 - Progressing by Darrow BussingArcilla,  M, RN   Safety: Ability to remain free from injury will improve 06/21/2017 1419 - Progressing by Darrow BussingArcilla,  M, RN

## 2017-06-21 NOTE — Interval H&P Note (Signed)
History and Physical Interval Note:  06/21/2017 7:08 AM  Kathy Coleman  has presented today for surgery, with the diagnosis of LEFT KNEE OSTEOARTHRITIS  The various methods of treatment have been discussed with the patient and family. After consideration of risks, benefits and other options for treatment, the patient has consented to  Procedure(s): LEFT TOTAL KNEE ARTHROPLASTY (Left) as a surgical intervention .  The patient's history has been reviewed, patient examined, no change in status, stable for surgery.  I have reviewed the patient's chart and labs.  Questions were answered to the patient's satisfaction.     Nestor LewandowskyFrank J 

## 2017-06-21 NOTE — Anesthesia Procedure Notes (Addendum)
Anesthesia Regional Block: Adductor canal block   Pre-Anesthetic Checklist: ,, timeout performed, Correct Patient, Correct Site, Correct Laterality, Correct Procedure, Correct Position, site marked, Risks and benefits discussed,  Surgical consent,  Pre-op evaluation,  At surgeon's request and post-op pain management  Laterality: Left and Lower  Prep: chloraprep       Needles:   Needle Type: Echogenic Stimulator Needle     Needle Length: 9cm  Needle Gauge: 21   Needle insertion depth: 5 cm   Additional Needles:   Procedures:,,,, ultrasound used (permanent image in chart),,,,  Narrative:  Start time: 06/21/2017 8:50 AM End time: 06/21/2017 9:05 AM Injection made incrementally with aspirations every 5 mL.  Performed by: Personally  Anesthesiologist: Sharee HolsterMassagee, , MD

## 2017-06-21 NOTE — Anesthesia Postprocedure Evaluation (Signed)
Anesthesia Post Note  Patient: Kathy Coleman  Procedure(s) Performed: LEFT TOTAL KNEE ARTHROPLASTY (Left Knee)     Patient location during evaluation: PACU Anesthesia Type: General and Regional Level of consciousness: awake and sedated Pain management: pain level controlled Vital Signs Assessment: post-procedure vital signs reviewed and stable Respiratory status: spontaneous breathing, nonlabored ventilation, respiratory function stable and patient connected to nasal cannula oxygen Cardiovascular status: blood pressure returned to baseline and stable Postop Assessment: no apparent nausea or vomiting Anesthetic complications: no    Last Vitals:  Vitals:   06/21/17 1208 06/21/17 1243  BP:  (!) 142/71  Pulse:  90  Resp:    Temp: 36.7 C 37.1 C  SpO2:  96%    Last Pain:  Vitals:   06/21/17 1243  TempSrc: Oral  PainSc:                  ,JAMES TERRILL

## 2017-06-21 NOTE — Discharge Instructions (Signed)

## 2017-06-22 ENCOUNTER — Encounter (HOSPITAL_COMMUNITY): Payer: Self-pay | Admitting: Orthopedic Surgery

## 2017-06-22 LAB — CBC
HCT: 29.5 % — ABNORMAL LOW (ref 36.0–46.0)
Hemoglobin: 9.8 g/dL — ABNORMAL LOW (ref 12.0–15.0)
MCH: 30.9 pg (ref 26.0–34.0)
MCHC: 33.2 g/dL (ref 30.0–36.0)
MCV: 93.1 fL (ref 78.0–100.0)
PLATELETS: 213 10*3/uL (ref 150–400)
RBC: 3.17 MIL/uL — ABNORMAL LOW (ref 3.87–5.11)
RDW: 13.7 % (ref 11.5–15.5)
WBC: 6.5 10*3/uL (ref 4.0–10.5)

## 2017-06-22 LAB — BASIC METABOLIC PANEL
Anion gap: 6 (ref 5–15)
BUN: 10 mg/dL (ref 6–20)
CALCIUM: 8.4 mg/dL — AB (ref 8.9–10.3)
CO2: 26 mmol/L (ref 22–32)
CREATININE: 0.92 mg/dL (ref 0.44–1.00)
Chloride: 106 mmol/L (ref 101–111)
GFR calc Af Amer: >60 mL/min (ref >60)
GFR calc non Af Amer: >60 mL/min (ref >60)
GLUCOSE: 89 mg/dL (ref 65–99)
Potassium: 4.3 mmol/L (ref 3.5–5.1)
SODIUM: 138 mmol/L (ref 135–145)

## 2017-06-22 NOTE — Evaluation (Signed)
Occupational Therapy Evaluation and Discharge Patient Details Name: Kathy Coleman MRN: [id number] DOB: [birth year] Today's Date: 06/22/2017    History of Present Illness Pt is a 61 y/o female s/p elective L TKA. PMH includes HTN, OA, back surgery, bilat THA, and R TKA.    Clinical Impression   Pt with hx of multiple orthopedic surgeries. She will rely on her husband to assist with LB bathing and dressing. Educated in compensatory strategies, fall prevention, safe footwear. Reviewed shower transfer and recommended supervision for safety. Pt with increased R knee pain. Applied ice at end of session. No further OT needs.    Follow Up Recommendations  No OT follow up    Equipment Recommendations  None recommended by OT    Recommendations for Other Services       Precautions / Restrictions Precautions Precautions: Knee Precaution Booklet Issued: Yes (comment) Precaution Comments: No pillow under knee Restrictions Weight Bearing Restrictions: Yes LLE Weight Bearing: Weight bearing as tolerated      Mobility Bed Mobility Overal bed mobility: Needs Assistance Bed Mobility: Sit to Supine;Supine to Sit     Supine to sit: Supervision Sit to supine: Supervision   General bed mobility comments: Supervision, just in case she needed assist for LLE; managing well  Transfers Overall transfer level: Needs assistance Equipment used: Rolling walker (2 wheeled) Transfers: Sit to/from Stand Sit to Stand: Supervision         General transfer comment: Supervision for safety given considerable amount of pain    Balance Overall balance assessment: Needs assistance Sitting-balance support: No upper extremity supported;Feet supported Sitting balance-Leahy Scale: Good       Standing balance-Leahy Scale: Fair Standing balance comment: can release walker in static standing                           ADL either performed or assessed with clinical judgement   ADL Overall  ADL's : Needs assistance/impaired Eating/Feeding: Independent   Grooming: Wash/dry hands;Standing;Supervision/safety   Upper Body Bathing: Set up;Sitting   Lower Body Bathing: Minimal assistance;Sit to/from stand   Upper Body Dressing : Set up;Sitting   Lower Body Dressing: Minimal assistance;Sit to/from stand   Toilet Transfer: Supervision/safety;RW;Ambulation;BSC   Toileting- ArchitectClothing Manipulation and Hygiene: Supervision/safety;Sit to/from Nurse, children'sstand     Tub/Shower Transfer Details (indicate cue type and reason): educated in technique, husband with supervise Functional mobility during ADLs: Supervision/safety;Rolling walker       Vision Patient Visual Report: No change from baseline       Perception     Praxis      Pertinent Vitals/Pain Pain Assessment: Faces Pain Score: 8  Faces Pain Scale: Hurts whole lot Pain Location: L knee  Pain Descriptors / Indicators: Aching;Operative site guarding Pain Intervention(s): Ice applied;Premedicated before session     Hand Dominance Right   Extremity/Trunk Assessment Upper Extremity Assessment Upper Extremity Assessment: Overall WFL for tasks assessed   Lower Extremity Assessment Lower Extremity Assessment: Defer to PT evaluation   Cervical / Trunk Assessment Cervical / Trunk Assessment: Normal   Communication Communication Communication: No difficulties   Cognition Arousal/Alertness: Awake/alert Behavior During Therapy: WFL for tasks assessed/performed Overall Cognitive Status: Within Functional Limits for tasks assessed                                     General Comments       Exercises  Shoulder Instructions      Home Living Family/patient expects to be discharged to:: Private residence Living Arrangements: Spouse/significant other Available Help at Discharge: Family;Available 24 hours/day Type of Home: House Home Access: Level entry     Home Layout: One level     Bathroom  Shower/Tub: Producer, television/film/videoWalk-in shower   Bathroom Toilet: Handicapped height     Home Equipment: Environmental consultantWalker - 2 wheels;Cane - single point;Shower seat;Grab bars - tub/shower;Bedside commode          Prior Functioning/Environment Level of Independence: Independent with assistive device(s)        Comments: Used cane and walker on her bad days         OT Problem List: Pain      OT Treatment/Interventions:      OT Goals(Current goals can be found in the care plan section) Acute Rehab OT Goals Patient Stated Goal: to decrease pain   OT Frequency:     Barriers to D/C:            Co-evaluation              AM-PAC PT "6 Clicks" Daily Activity     Outcome Measure Help from another person eating meals?: None Help from another person taking care of personal grooming?: A Little Help from another person toileting, which includes using toliet, bedpan, or urinal?: A Little Help from another person bathing (including washing, rinsing, drying)?: A Little Help from another person to put on and taking off regular upper body clothing?: None Help from another person to put on and taking off regular lower body clothing?: A Little 6 Click Score: 20   End of Session Equipment Utilized During Treatment: Gait belt;Rolling walker  Activity Tolerance: Patient limited by pain Patient left: in bed;with call bell/phone within reach;with family/visitor present  OT Visit Diagnosis: Unsteadiness on feet (R26.81);Pain                Time: 1441-1459 OT Time Calculation (min): 18 min Charges:  OT General Charges $OT Visit: 1 Visit OT Evaluation $OT Eval Low Complexity: 1 Low G-Codes:     06/22/2017 Martie RoundJulie , OTR/L Pager: [phone] Kathy Coleman, Kathy BailiffJulie Coleman 06/22/2017, 2:59 PM

## 2017-06-22 NOTE — Progress Notes (Signed)
Physical Therapy Treatment Patient Details Name: Kathy MediciMyra L Feger MRN: [id number] DOB: Oct 17, 1955 Today's Date: 06/22/2017    History of Present Illness Pt is a 61 y/o female s/p elective L TKA. PMH includes HTN, OA, back surgery, bilat THA, and R TKA.     PT Comments    Continuing work on functional mobility and activity tolerance;  Excellent progress noted today, and pt is OK for dc home from PT standpoint; If she is still here for PM session, plan on focusing on therex (pt opted to hold on therex this session in favor of eating breakfast)   Follow Up Recommendations  DC plan and follow up therapy as arranged by surgeon;Home health PT;Supervision for mobility/OOB     Equipment Recommendations  3in1 (PT)    Recommendations for Other Services       Precautions / Restrictions Precautions Precautions: Knee Precaution Booklet Issued: Yes (comment) Precaution Comments: Pt educated to not allow any pillow or bolster under knee for healing with optimal range of motion.  Restrictions LLE Weight Bearing: Weight bearing as tolerated    Mobility  Bed Mobility                  Transfers Overall transfer level: Needs assistance Equipment used: Rolling walker (2 wheeled) Transfers: Sit to/from Stand Sit to Stand: Supervision         General transfer comment: Cues for technique; Encouraged to allow for knee flexion during transitions  Ambulation/Gait Ambulation/Gait assistance: Supervision Ambulation Distance (Feet): 150 Feet Assistive device: Rolling walker (2 wheeled) Gait Pattern/deviations: Step-through pattern     General Gait Details: Very nice progress; cues to activate quad for stance stability   Stairs Stairs: Yes   Stair Management: One rail Left;Sideways;No rails;Backwards;With walker Number of Stairs: 5(x2 using different techniques) General stair comments: Cues for sequence; practiced steps with rail L and with RW and minguard assist  Wheelchair  Mobility    Modified Rankin (Stroke Patients Only)       Balance     Sitting balance-Leahy Scale: Good       Standing balance-Leahy Scale: Fair                              Cognition Arousal/Alertness: Awake/alert Behavior During Therapy: WFL for tasks assessed/performed Overall Cognitive Status: Within Functional Limits for tasks assessed                                        Exercises      General Comments General comments (skin integrity, edema, etc.): WE talked about car transfer [facility] [employer]      Pertinent Vitals/Pain Pain Assessment: 0-10 Pain Score: 6  Pain Location: L knee  Pain Descriptors / Indicators: Aching;Operative site guarding Pain Intervention(s): Monitored during session    Home Living                      Prior Function            PT Goals (current goals can now be found in the care plan section) Acute Rehab PT Goals Patient Stated Goal: to decrease pain  PT Goal Formulation: With patient Time For Goal Achievement: 06/28/17 Potential to Achieve Goals: Good Progress towards PT goals: Progressing toward goals    Frequency    7X/week  PT Plan Current plan remains appropriate    Co-evaluation              AM-PAC PT "6 Clicks" Daily Activity  Outcome Measure  Difficulty turning over in bed (including adjusting bedclothes, sheets and blankets)?: None Difficulty moving from lying on back to sitting on the side of the bed? : None Difficulty sitting down on and standing up from a chair with arms (e.g., wheelchair, bedside commode, etc,.)?: None Help needed moving to and from a bed to chair (including a wheelchair)?: None Help needed walking in hospital room?: None Help needed climbing 3-5 steps with a railing? : A Little 6 Click Score: 23    End of Session Equipment Utilized During Treatment: Gait belt Activity Tolerance: Patient tolerated treatment well Patient left: in  chair;with call bell/phone within reach Nurse Communication: Mobility status;Other (comment)(OK for dc home today) PT Visit Diagnosis: Other abnormalities of gait and mobility (R26.89);Pain Pain - Right/Left: Left Pain - part of body: Knee     Time: [id number] PT Time Calculation (min) (ACUTE ONLY): 25 min  Charges:  $Gait Training: 23-37 mins                    G Codes:       Van ClinesHolly , PT  Acute Rehabilitation Services Pager [phone] Office [phone]    Levi AlandHolly H  06/22/2017, 10:14 AM

## 2017-06-22 NOTE — Progress Notes (Signed)
PATIENT ID: Kathy Coleman  MRN: [id number]  DOB/AGE:  August 20, 1955 / 61 y.o.  1 Day Post-Op Procedure(s) (LRB): LEFT TOTAL KNEE ARTHROPLASTY (Left)    PROGRESS NOTE Subjective: Patient is alert, oriented, x1 Nausea, x1 Vomiting, yes passing gas. Taking PO well. Denies SOB, Chest or Calf Pain. Using Incentive Spirometer, PAS in place. Ambulate walked in room without any difficulty, Patient reports pain as 3/10 .    Objective: Vital signs in last 24 hours: Vitals:   06/21/17 1208 06/21/17 1243 06/21/17 2243 06/21/17 2300  BP:  (!) 142/71 135/69 (!) 116/58  Pulse:  90 80 70  Resp:   17 17  Temp: 98 F (36.7 C) 98.7 F (37.1 C) 99.1 F (37.3 C) 98.7 F (37.1 C)  TempSrc:  Oral Oral Oral  SpO2:  96% 98% 97%  Weight:      Height:          Intake/Output from previous day: I/O last 3 completed shifts: In: 1876 [P.O.:476; I.V.:1400] Out: 650 [Urine:600; Blood:50]   Intake/Output this shift: No intake/output data recorded.   LABORATORY DATA: No results for input(s): WBC, HGB, HCT, PLT, NA, K, CL, CO2, BUN, CREATININE, GLUCOSE, GLUCAP, INR, CALCIUM in the last 72 hours.  Invalid input(s): PT, 2  Examination: Neurologically intact ABD soft Neurovascular intact Sensation intact distally Intact pulses distally Dorsiflexion/Plantar flexion intact Incision: dressing C/D/I No cellulitis present Compartment soft}  Assessment:   1 Day Post-Op Procedure(s) (LRB): LEFT TOTAL KNEE ARTHROPLASTY (Left) ADDITIONAL DIAGNOSIS: Expected Acute Blood Loss Anemia,   Plan: PT/OT WBAT, AROM and PROM  DVT Prophylaxis:  SCDx72hrs, ASA 325 mg BID x 2 weeks DISCHARGE PLAN: Home, possibly today if patient passes all physical therapy goals DISCHARGE NEEDS: HHPT, Walker and 3-in-1 comode seat     Nestor LewandowskyFrank J  06/22/2017, 6:18 AM

## 2017-06-22 NOTE — Progress Notes (Signed)
Physical Therapy Treatment Patient Details Name: Kathy MediciMyra L Meldrum MRN: [id number] DOB: 22-May-1956 Today's Date: 06/22/2017    History of Present Illness Pt is a 61 y/o female s/p elective L TKA. PMH includes HTN, OA, back surgery, bilat THA, and R TKA.     PT Comments    Continuing work on functional mobility and activity tolerance;  Willing to work on walking and therex despite a considerable amount of pain; Questions solicited and answered; OK for dc home from PT standpoint    Follow Up Recommendations  DC plan and follow up therapy as arranged by surgeon;Home health PT;Supervision for mobility/OOB     Equipment Recommendations  3in1 (PT)    Recommendations for Other Services       Precautions / Restrictions Precautions Precautions: Knee Precaution Booklet Issued: Yes (comment) Precaution Comments: Pt educated to not allow any pillow or bolster under knee for healing with optimal range of motion.  Restrictions LLE Weight Bearing: Weight bearing as tolerated    Mobility  Bed Mobility Overal bed mobility: Needs Assistance Bed Mobility: Sit to Supine;Supine to Sit     Supine to sit: Supervision Sit to supine: Supervision   General bed mobility comments: Supervision, just in case she needed assist for LLE; managing well  Transfers Overall transfer level: Needs assistance Equipment used: Rolling walker (2 wheeled) Transfers: Sit to/from Stand Sit to Stand: Supervision         General transfer comment: Supervision for safety given considerable amount of pain  Ambulation/Gait Ambulation/Gait assistance: Supervision Ambulation Distance (Feet): 150 Feet Assistive device: Rolling walker (2 wheeled) Gait Pattern/deviations: Step-through pattern Gait velocity: Decreased    General Gait Details: Very nice progress; cues to activate quad for stance stability   Stairs Stairs: Yes   Stair Management: One rail Left;Sideways;No rails;Backwards;With walker Number of  Stairs: 5(x2 using different techniques) General stair comments: Cues for sequence; practiced steps with rail L and with RW and minguard assist  Wheelchair Mobility    Modified Rankin (Stroke Patients Only)       Balance     Sitting balance-Leahy Scale: Good       Standing balance-Leahy Scale: Fair                              Cognition Arousal/Alertness: Awake/alert Behavior During Therapy: WFL for tasks assessed/performed Overall Cognitive Status: Within Functional Limits for tasks assessed                                        Exercises Total Joint Exercises Ankle Circles/Pumps: AROM;Both;10 reps Quad Sets: AROM;Left;10 reps Short Arc Quad: AROM;Left;10 reps Heel Slides: AAROM;Left;10 reps Hip ABduction/ADduction: AAROM;Left;10 reps Straight Leg Raises: AAROM;Left;10 reps Goniometric ROM: approx 2-78 deg    General Comments General comments (skin integrity, edema, etc.): WE talked about car transfer to [employer]      Pertinent Vitals/Pain Pain Assessment: 0-10 Pain Score: 8  Pain Location: L knee  Pain Descriptors / Indicators: Aching;Operative site guarding Pain Intervention(s): Monitored during session    Home Living                      Prior Function            PT Goals (current goals can now be found in the care plan section) Acute Rehab PT Goals Patient  Stated Goal: to decrease pain  PT Goal Formulation: With patient Time For Goal Achievement: 06/28/17 Potential to Achieve Goals: Good Progress towards PT goals: Progressing toward goals    Frequency    7X/week      PT Plan Current plan remains appropriate    Co-evaluation              AM-PAC PT "6 Clicks" Daily Activity  Outcome Measure  Difficulty turning over in bed (including adjusting bedclothes, sheets and blankets)?: None Difficulty moving from lying on back to sitting on the side of the bed? : A Little Difficulty sitting down on  and standing up from a chair with arms (e.g., wheelchair, bedside commode, etc,.)?: A Little Help needed moving to and from a bed to chair (including a wheelchair)?: A Little Help needed walking in hospital room?: None Help needed climbing 3-5 steps with a railing? : None 6 Click Score: 21    End of Session Equipment Utilized During Treatment: Gait belt Activity Tolerance: Patient tolerated treatment well Patient left: in bed;with call bell/phone within reach;with family/visitor present Nurse Communication: Mobility status;Other (comment)(To coordinate drive home with pain pill) PT Visit Diagnosis: Other abnormalities of gait and mobility (R26.89);Pain Pain - Right/Left: Left Pain - part of body: Knee     Time: [id number] PT Time Calculation (min) (ACUTE ONLY): 27 min  Charges:  $Gait Training: 8-22 mins $Therapeutic Exercise: 8-22 mins                    G Codes:       Van ClinesHolly , PT  Acute Rehabilitation Services Pager [phone] Office [phone]    Levi AlandHolly H  06/22/2017, 1:16 PM

## 2017-06-23 LAB — CBC
HCT: 31.7 % — ABNORMAL LOW (ref 36.0–46.0)
HEMOGLOBIN: 10.2 g/dL — AB (ref 12.0–15.0)
MCH: 30.4 pg (ref 26.0–34.0)
MCHC: 32.2 g/dL (ref 30.0–36.0)
MCV: 94.3 fL (ref 78.0–100.0)
PLATELETS: 242 10*3/uL (ref 150–400)
RBC: 3.36 MIL/uL — AB (ref 3.87–5.11)
RDW: 14 % (ref 11.5–15.5)
WBC: 6.8 10*3/uL (ref 4.0–10.5)

## 2017-06-23 NOTE — Progress Notes (Signed)
Orthopedic Tech Progress Note Patient Details:  Kathy MediciMyra L Coleman 1956-06-24 [id number]  CPM Left Knee CPM Left Knee: On Left Knee Flexion (Degrees): 60 Left Knee Extension (Degrees): 0  Post Interventions Patient Tolerated: Well Instructions Provided: Care of device  Saul FordyceJennifer C  06/23/2017, 3:10 PM

## 2017-06-23 NOTE — Progress Notes (Signed)
PATIENT ID: Kathy Coleman  MRN: [id number]  DOB/AGE:  08-03-1955 / 61 y.o.  2 Days Post-Op Procedure(s) (LRB): LEFT TOTAL KNEE ARTHROPLASTY (Left)    PROGRESS NOTE Subjective: Patient is alert, oriented, no Nausea, no Vomiting, yes passing gas. Taking PO well. Denies SOB, Chest or Calf Pain. Using Incentive Spirometer, PAS in place. Ambulate WBAT with pt walking 150 ft with therapy, Patient reports pain as moderate .  Pt has requested CPM.  Objective: Vital signs in last 24 hours: Vitals:   06/22/17 1951 06/23/17 0013 06/23/17 0300 06/23/17 0432  BP: 116/64 (!) 102/55  115/67  Pulse: 76 74  81  Resp: 16 18  18   Temp: 98.9 F (37.2 C) (!) 97.2 F (36.2 C) 98.2 F (36.8 C) 98.2 F (36.8 C)  TempSrc: Oral Oral  Oral  SpO2: 97% 94%  99%  Weight:      Height:          Intake/Output from previous day: I/O last 3 completed shifts: In: 690 [P.O.:690] Out: -    Intake/Output this shift: Total I/O In: 240 [P.O.:240] Out: -    LABORATORY DATA: Recent Labs    06/22/17 0644 06/23/17 0616  WBC 6.5 6.8  HGB 9.8* 10.2*  HCT 29.5* 31.7*  PLT 213 242  NA 138  --   K 4.3  --   CL 106  --   CO2 26  --   BUN 10  --   CREATININE 0.92  --   GLUCOSE 89  --   CALCIUM 8.4*  --     Examination: Neurologically intact Neurovascular intact Sensation intact distally Intact pulses distally Dorsiflexion/Plantar flexion intact Incision: dressing C/D/I and no drainage No cellulitis present Compartment soft}  Assessment:   2 Days Post-Op Procedure(s) (LRB): LEFT TOTAL KNEE ARTHROPLASTY (Left) ADDITIONAL DIAGNOSIS: Expected Acute Blood Loss Anemia,   Plan: PT/OT WBAT, AROM and PROM  DVT Prophylaxis:  SCDx72hrs, ASA 325 mg BID x 2 weeks DISCHARGE PLAN: Home DISCHARGE NEEDS: HHPT, Walker and 3-in-1 comode seat, CPM     Dannielle Burnric  06/23/2017, 2:21 PM

## 2017-06-23 NOTE — Care Management Note (Signed)
Case Management Note  Patient Details  Name: Kathy Coleman MRN: [id number] Date of Birth: 06-27-1956  Subjective/Objective:  61 yr old female s/p left total knee arthroplasty.                 Action/Plan: Case manager spoke with patient and her husband concerning Home Health and DME. Choice for Home Health agency was offered. Referral was called to [facility] Patient will have family support at discharge. CPM will be delivered to her home, she has RW and 3in1.    Expected Discharge Date:  06/23/17               Expected Discharge Plan:  Home w Home Health Services  In-House Referral:     Discharge planning Services  CM Consult  Post Acute Care Choice:  Home Health Choice offered to:  Spouse, Patient  DME Arranged:  CPM DME Agency:  [employer]  HH Arranged:  Disease Management HH Agency:  [facility]  Status of Service:  Completed, signed off  If discussed at Long Length of Stay Meetings, dates discussed:    Additional Comments:  Kathy Coleman,  Naomi, RN 06/23/2017, 3:30 PM

## 2017-06-23 NOTE — Progress Notes (Signed)
Physical Therapy Treatment Patient Details Name: Kathy MediciMyra L Jamroz MRN: [id number] DOB: 02-02-1956 Today's Date: 06/23/2017    History of Present Illness Pt is a 61 y/o female s/p elective L TKA. PMH includes HTN, OA, back surgery, bilat THA, and R TKA.     PT Comments    Continuing work on functional mobility and activity tolerance;  Excellent walk in hallway; OK for dc home from PT standpoint    Follow Up Recommendations  DC plan and follow up therapy as arranged by surgeon;Home health PT;Supervision for mobility/OOB     Equipment Recommendations  3in1 (PT)    Recommendations for Other Services       Precautions / Restrictions Precautions Precautions: Knee Precaution Booklet Issued: Yes (comment) Precaution Comments: Pt educated to not allow any pillow or bolster under knee for healing with optimal range of motion.  Restrictions LLE Weight Bearing: Weight bearing as tolerated    Mobility  Bed Mobility Overal bed mobility: Modified Independent                Transfers Overall transfer level: Needs assistance Equipment used: Rolling walker (2 wheeled) Transfers: Sit to/from Stand Sit to Stand: Supervision         General transfer comment: Supervision for safety given considerable amount of pain  Ambulation/Gait Ambulation/Gait assistance: Supervision Ambulation Distance (Feet): 275 Feet Assistive device: Rolling walker (2 wheeled) Gait Pattern/deviations: Step-through pattern Gait velocity: Decreased    General Gait Details: Nice, stable L knee in stance   Stairs            Wheelchair Mobility    Modified Rankin (Stroke Patients Only)       Balance     Sitting balance-Leahy Scale: Good       Standing balance-Leahy Scale: Fair Standing balance comment: can release walker in static standing                            Cognition Arousal/Alertness: Awake/alert Behavior During Therapy: WFL for tasks assessed/performed Overall  Cognitive Status: Within Functional Limits for tasks assessed                                        Exercises      General Comments        Pertinent Vitals/Pain Pain Assessment: 0-10 Pain Score: 8  Pain Location: L knee  Pain Descriptors / Indicators: Aching;Operative site guarding Pain Intervention(s): Monitored during session;Premedicated before session    Home Living                      Prior Function            PT Goals (current goals can now be found in the care plan section) Acute Rehab PT Goals Patient Stated Goal: to decrease pain  PT Goal Formulation: With patient Time For Goal Achievement: 06/28/17 Potential to Achieve Goals: Good Progress towards PT goals: Progressing toward goals    Frequency    7X/week      PT Plan Current plan remains appropriate    Co-evaluation              AM-PAC PT "6 Clicks" Daily Activity  Outcome Measure  Difficulty turning over in bed (including adjusting bedclothes, sheets and blankets)?: None Difficulty moving from lying on back to sitting on the side of the  bed? : None Difficulty sitting down on and standing up from a chair with arms (e.g., wheelchair, bedside commode, etc,.)?: None Help needed moving to and from a bed to chair (including a wheelchair)?: None Help needed walking in hospital room?: None Help needed climbing 3-5 steps with a railing? : None 6 Click Score: 24    End of Session Equipment Utilized During Treatment: Gait belt Activity Tolerance: Patient tolerated treatment well Patient left: in bed;with call bell/phone within reach;with family/visitor present Nurse Communication: Mobility status;Other (comment)(To coordinate drive home with pain pill) PT Visit Diagnosis: Other abnormalities of gait and mobility (R26.89);Pain Pain - Right/Left: Left Pain - part of body: Knee     Time: [id number] PT Time Calculation (min) (ACUTE ONLY): 23 min  Charges:  $Gait  Training: 23-37 mins                    G Codes:       Van ClinesHolly , PT  Acute Rehabilitation Services Pager [phone] Office [phone]    Levi AlandHolly H  06/23/2017, 2:45 PM

## 2017-06-23 NOTE — Care Management Important Message (Signed)
Important Message  Patient Details  Name: Kathy MediciMyra L Coleman MRN: [id number] Date of Birth: [birth year]   Medicare Important Message Given:  Yes     Stefan ChurchBratton 06/23/2017, 3:32 PM

## 2017-06-23 NOTE — Discharge Summary (Signed)
Patient ID: Kathy Coleman MRN: [id number] DOB/AGE: Jul 26, 1955 61 y.o.  Admit date: 06/21/2017 Discharge date: 06/23/2017  Admission Diagnoses:  Principal Problem:   Degenerative arthritis of left knee Active Problems:   Primary osteoarthritis of left knee   Discharge Diagnoses:  Same  Past Medical History:  Diagnosis Date  . Arthritis   . Constipation    takes Miralax nightly  . GERD (gastroesophageal reflux disease)    takes Zantac daily  . History of blood transfusion   . History of kidney stones   . Hyperlipidemia    takes Atorvastatin daily  . Hypertension    takes Lisinopril daily  . Osteoarthritis    left hip  . Osteoarthritis of left knee   . PONV (postoperative nausea and vomiting)    in the past did not have with last surgery in december    Surgeries: Procedure(s): LEFT TOTAL KNEE ARTHROPLASTY on 06/21/2017   Consultants:   Discharged Condition: Improved  Hospital Course: ALIKA EPPES is an 61 y.o. female who was admitted 06/21/2017 for operative treatment ofDegenerative arthritis of left knee. Patient has severe unremitting pain that affects sleep, daily activities, and work/hobbies. After pre-op clearance the patient was taken to the operating room on 06/21/2017 and underwent  Procedure(s): LEFT TOTAL KNEE ARTHROPLASTY.    Patient was given perioperative antibiotics:  Anti-infectives (From admission, onward)   Start     Dose/Rate Route Frequency Ordered Stop   06/21/17 0845  ceFAZolin (ANCEF) IVPB 2g/100 mL premix     2 g 200 mL/hr over 30 Minutes Intravenous To ShortStay Surgical 06/18/17 1850 06/21/17 0916       Patient was given sequential compression devices, early ambulation, and chemoprophylaxis to prevent DVT.  Patient benefited maximally from hospital stay and there were no complications.    Recent vital signs:  Patient Vitals for the past 24 hrs:  BP Temp Temp src Pulse Resp SpO2  06/23/17 0432 115/67 98.2 F (36.8 C) Oral 81 18 99  %  06/23/17 0300 - 98.2 F (36.8 C) - - - -  06/23/17 0013 (!) 102/55 (!) 97.2 F (36.2 C) Oral 74 18 94 %  06/22/17 1951 116/64 98.9 F (37.2 C) Oral 76 16 97 %     Recent laboratory studies:  Recent Labs    06/22/17 0644 06/23/17 0616  WBC 6.5 6.8  HGB 9.8* 10.2*  HCT 29.5* 31.7*  PLT 213 242  NA 138  --   K 4.3  --   CL 106  --   CO2 26  --   BUN 10  --   CREATININE 0.92  --   GLUCOSE 89  --   CALCIUM 8.4*  --      Discharge Medications:   Allergies as of 06/23/2017      Reactions   Decadron [dexamethasone] Other (See Comments)   Severe burning in perineal area      Medication List    STOP taking these medications   HYDROcodone-acetaminophen 5-325 MG tablet Commonly known as:  NORCO/VICODIN   indomethacin 50 MG capsule Commonly known as:  INDOCIN     TAKE these medications   ADULT GUMMY PO Take 2 tablets by mouth daily.   aspirin EC 325 MG tablet Take 1 tablet (325 mg total) by mouth 2 (two) times daily.   atorvastatin 20 MG tablet Commonly known as:  LIPITOR Take 20 mg by mouth at bedtime.   gabapentin 300 MG capsule Commonly known as:  NEURONTIN Take 300  mg by mouth 3 (three) times daily.   lisinopril 20 MG tablet Commonly known as:  PRINIVIL,ZESTRIL Take 20 mg by mouth daily.   methocarbamol 500 MG tablet Commonly known as:  ROBAXIN Take 1 tablet (500 mg total) by mouth every 6 (six) hours as needed for muscle spasms.   oxyCODONE-acetaminophen 5-325 MG tablet Commonly known as:  ROXICET Take 1 tablet by mouth every 4 (four) hours as needed.   polyethylene glycol packet Commonly known as:  MIRALAX / GLYCOLAX Take 17 g by mouth 2 (two) times daily.   ranitidine 150 MG tablet Commonly known as:  ZANTAC Take 150 mg by mouth at bedtime.   traMADol 50 MG tablet Commonly known as:  ULTRAM Take 50-100 mg by mouth every 8 (eight) hours. 1-2 tablets depending on pain            Durable Medical Equipment  (From admission, onward)         Start     Ordered   06/23/17 1424  For home use only DME Continuous passive motion machine  Once     06/23/17 1423   06/21/17 1238  DME Walker rolling  Once    Question:  Patient needs a walker to treat with the following condition  Answer:  Status post total left knee replacement   06/21/17 1237   06/21/17 1238  DME 3 n 1  Once     06/21/17 1237   06/21/17 1238  DME Bedside commode  Once    Question:  Patient needs a bedside commode to treat with the following condition  Answer:  Status post total left knee replacement   06/21/17 1237      Diagnostic Studies: Dg Chest 2 View  Result Date: 06/18/2017 CLINICAL DATA:  Preoperative examination. EXAM: CHEST  2 VIEW COMPARISON:  06/05/2016 FINDINGS: Cardiomediastinal silhouette is normal. Mediastinal contours appear intact. There is no evidence of focal airspace consolidation, pleural effusion or pneumothorax. Mild linear atelectasis versus scarring in the lingula. Osseous structures are without acute abnormality. Postsurgical changes of cervical and lumbosacral spine fusion. Soft tissues are grossly normal. IMPRESSION: No active cardiopulmonary disease. Electronically Signed   By: Ted Mcalpineobrinka  Dimitrova M.D.   On: 06/18/2017 14:51    Disposition: 01-Home or Self Care  Discharge Instructions    CPM   Complete by:  As directed    Continuous passive motion machine (CPM):      Use the CPM from 0 to 60  for 5 hours per day.      You may increase by 10 degrees per day.  You may break it up into 2 or 3 sessions per day.      Use CPM for 2 weeks or until you are told to stop.   Call MD / Call 911   Complete by:  As directed    If you experience chest pain or shortness of breath, CALL 911 and be transported to the hospital emergency room.  If you develope a fever above 101 F, pus (white drainage) or increased drainage or redness at the wound, or calf pain, call your surgeon's office.   Constipation Prevention   Complete by:  As directed     Drink plenty of fluids.  Prune juice may be helpful.  You may use a stool softener, such as Colace (over the counter) 100 mg twice a day.  Use MiraLax (over the counter) for constipation as needed.   Diet - low sodium heart healthy   Complete by:  As directed    Driving restrictions   Complete by:  As directed    No driving for 2 weeks   Increase activity slowly as tolerated   Complete by:  As directed    Patient may shower   Complete by:  As directed    You may shower without a dressing once there is no drainage.  Do not wash over the wound.  If drainage remains, cover wound with plastic wrap and then shower.      Follow-up Information    Gean Birchwoodowan, Frank, MD In 2 weeks.   Specialty:  Orthopedic Surgery Contact information: [address] [place] [address] [phone]            Signed: Dannielle Burnric  06/23/2017, 2:25 PM

## 2017-06-23 NOTE — Progress Notes (Signed)
PT Cancellation Note  Patient Details Name: Kathy Coleman MRN: [id number] DOB: 13-Jun-1956   Cancelled Treatment:    Reason Eval/Treat Not Completed: Other (comment)   Just recently placed in CPM, and wishes to get more time in CPM before going home;  Politely declines PT this afternoon;   Moving well; OK for dc home from PT standpoint   Van ClinesHolly , PT  Acute Rehabilitation Services Pager [phone] Office [phone]    Kathy Coleman  06/23/2017, 3:37 PM

## 2017-06-30 ENCOUNTER — Other Ambulatory Visit: Payer: Self-pay

## 2017-06-30 ENCOUNTER — Emergency Department (HOSPITAL_COMMUNITY)
Admission: EM | Admit: 2017-06-30 | Discharge: 2017-06-30 | Disposition: A | Discharge status: Home / Self Care | Payer: Medicare FFS | Attending: Emergency Medicine | Admitting: Emergency Medicine

## 2017-06-30 ENCOUNTER — Encounter (HOSPITAL_COMMUNITY): Payer: Self-pay | Admitting: Emergency Medicine

## 2017-06-30 DIAGNOSIS — Z96653 Presence of artificial knee joint, bilateral: Secondary | ICD-10-CM | POA: Diagnosis not present

## 2017-06-30 DIAGNOSIS — Z87891 Personal history of nicotine dependence: Secondary | ICD-10-CM | POA: Diagnosis not present

## 2017-06-30 DIAGNOSIS — Z79899 Other long term (current) drug therapy: Secondary | ICD-10-CM | POA: Insufficient documentation

## 2017-06-30 DIAGNOSIS — I10 Essential (primary) hypertension: Secondary | ICD-10-CM | POA: Diagnosis not present

## 2017-06-30 DIAGNOSIS — Z96643 Presence of artificial hip joint, bilateral: Secondary | ICD-10-CM | POA: Diagnosis not present

## 2017-06-30 DIAGNOSIS — R000 Tachycardia, unspecified: Secondary | ICD-10-CM | POA: Diagnosis present

## 2017-06-30 LAB — URINALYSIS, ROUTINE W REFLEX MICROSCOPIC
BILIRUBIN URINE: NEGATIVE
GLUCOSE, UA: NEGATIVE mg/dL
HGB URINE DIPSTICK: NEGATIVE
KETONES UR: NEGATIVE mg/dL
NITRITE: NEGATIVE
PH: 5 (ref 5.0–8.0)
Protein, ur: NEGATIVE mg/dL
Specific Gravity, Urine: 1.015 (ref 1.005–1.030)

## 2017-06-30 LAB — CBC WITH DIFFERENTIAL/PLATELET
Basophils Absolute: 0 10*3/uL (ref 0.0–0.1)
Basophils Relative: 1 %
EOS ABS: 0.2 10*3/uL (ref 0.0–0.7)
Eosinophils Relative: 4 %
HCT: 32.8 % — ABNORMAL LOW (ref 36.0–46.0)
HEMOGLOBIN: 10.8 g/dL — AB (ref 12.0–15.0)
LYMPHS ABS: 1 10*3/uL (ref 0.7–4.0)
LYMPHS PCT: 22 %
MCH: 30.9 pg (ref 26.0–34.0)
MCHC: 32.9 g/dL (ref 30.0–36.0)
MCV: 93.7 fL (ref 78.0–100.0)
Monocytes Absolute: 0.6 10*3/uL (ref 0.1–1.0)
Monocytes Relative: 12 %
NEUTROS PCT: 61 %
Neutro Abs: 2.9 10*3/uL (ref 1.7–7.7)
Platelets: 354 10*3/uL (ref 150–400)
RBC: 3.5 MIL/uL — AB (ref 3.87–5.11)
RDW: 13.8 % (ref 11.5–15.5)
WBC: 4.6 10*3/uL (ref 4.0–10.5)

## 2017-06-30 LAB — BASIC METABOLIC PANEL
Anion gap: 7 (ref 5–15)
BUN: 17 mg/dL (ref 6–20)
CHLORIDE: 102 mmol/L (ref 101–111)
CO2: 26 mmol/L (ref 22–32)
Calcium: 9.3 mg/dL (ref 8.9–10.3)
Creatinine, Ser: 1.11 mg/dL — ABNORMAL HIGH (ref 0.44–1.00)
GFR calc Af Amer: >60 mL/min (ref >60)
GFR calc non Af Amer: 52 mL/min — ABNORMAL LOW (ref >60)
Glucose, Bld: 95 mg/dL (ref 65–99)
POTASSIUM: 3.6 mmol/L (ref 3.5–5.1)
SODIUM: 135 mmol/L (ref 135–145)

## 2017-06-30 LAB — I-STAT CG4 LACTIC ACID, ED: Lactic Acid, Venous: 1.01 mmol/L (ref 0.5–1.9)

## 2017-06-30 MED ORDER — SODIUM CHLORIDE 0.9 % IV BOLUS (SEPSIS)
1000.0000 mL | Freq: Once | INTRAVENOUS | Status: AC
  Administered 2017-06-30: 1000 mL via INTRAVENOUS

## 2017-06-30 NOTE — ED Notes (Signed)
Per PA patient can take home meds at this time.

## 2017-06-30 NOTE — ED Triage Notes (Signed)
Pt reports she had knee replacement surgery 12/19, home health aide came to assess yesterday and noticed pt HR 110's. Pt rechecked this AM and still same. Pt denies CP, SOB.

## 2017-06-30 NOTE — ED Provider Notes (Signed)
[name] Provider Note   CSN: [id number] Arrival date & time: 06/30/17  [id number]     History   Chief Complaint Chief Complaint  Patient presents with  . Tachycardia    HPI Kathy Coleman is a 61 y.o. female with history of hypertension, hyperlipidemia, arthritis of the left knee status post recent left total knee arthroplasty by Dr. Turner Danielsowan on December 17 presents for elevated heart rate noticed 2 days ago by physical therapy aide at home. Initially noted to be 118 two days ago, this morning it was 109. Reports feeling like her heart beats faster and light-headedness when going from sitting to standing and walking around for the last 2 days, which resolves spontaneously at rest. No fevers, chills, recent illness, CP, SOB, orthopnea, cough, nausea, vomiting, abdominal pain, dysuria, hematuria, diarrhea. She has some swelling around left knee, but denies calf pain. Unsure about redness or warmth around surgical incision, she is supposed to f/u with surgeon today for dressing change for the first time. No h/o DVT/PE or known cardiac history. No pleuritic CP. Is on aspirin for blood clot prevention after surgery. Currently rates her left knee pain 6/10.   HPI  Past Medical History:  Diagnosis Date  . Arthritis   . Constipation    takes Miralax nightly  . GERD (gastroesophageal reflux disease)    takes Zantac daily  . History of blood transfusion   . History of kidney stones   . Hyperlipidemia    takes Atorvastatin daily  . Hypertension    takes Lisinopril daily  . Osteoarthritis    left hip  . Osteoarthritis of left knee   . PONV (postoperative nausea and vomiting)    in the past did not have with last surgery in december    Patient Active Problem List   Diagnosis Date Noted  . Primary osteoarthritis of left knee 06/21/2017  . Degenerative arthritis of left knee 06/16/2017  . Herniated nucleus pulposus, lumbar 10/05/2016  . Primary  localized osteoarthritis of left hip 06/15/2016  . Primary osteoarthritis of left hip 06/12/2016  . Dysphagia 05/15/2016  . Arthritis of knee 06/24/2015  . Primary osteoarthritis of right knee 06/22/2015  . Primary osteoarthritis of right hip 05/11/2015  . Postoperative anemia due to acute blood loss 07/24/2014  . Lumbar radiculopathy 07/23/2014    Past Surgical History:  Procedure Laterality Date  . ABDOMINAL HYSTERECTOMY    . BACK SURGERY     1998  , 2010  17 LOW BACK    . BIOPSY  02/13/2016   Procedure: BIOPSY;  Surgeon: Corbin Adeobert M Rourk, MD;  Location: [facility];  Service: Endoscopy;;  Gastric and Esophageal biopsies  . CHOLECYSTECTOMY    . COLONOSCOPY  03/24/2006   Dr.Rourk- a couple of anal papillae, o/w normal rectum with L sided diverticula, the remainder of the colonic mucosa appeared normal.   . COLONOSCOPY N/A 02/13/2016   Dr. Jena Gaussourk: diverticulosis, melanosis coli   . ESOPHAGOGASTRODUODENOSCOPY  03/24/2006   Dr.Rourk- normal esophagus. a couple of antral erosions, o/w normal stomach, patent pylorus, normal D1,D2  . ESOPHAGOGASTRODUODENOSCOPY N/A 02/13/2016   Dr. Jena Gaussourk: erythematous mucosa, normal D2, empiric dilation   . FOOT SURGERY     RIGHT  2014  . MALONEY DILATION N/A 02/13/2016   Procedure: Elease HashimotoMALONEY DILATION;  Surgeon: Corbin Adeobert M Rourk, MD;  Location: [facility];  Service: Endoscopy;  Laterality: N/A;  . NECK SURGERY     2005, 2010  . TOTAL  HIP ARTHROPLASTY Right 05/13/2015   Procedure: TOTAL HIP ARTHROPLASTY ANTERIOR APPROACH;  Surgeon: Gean Birchwood, MD;  Location: [facility] OR;  Service: Orthopedics;  Laterality: Right;  . TOTAL HIP ARTHROPLASTY Left 06/15/2016   Procedure: TOTAL HIP ARTHROPLASTY ANTERIOR APPROACH;  Surgeon: Gean Birchwood, MD;  Location: [facility] OR;  Service: Orthopedics;  Laterality: Left;  . TOTAL KNEE ARTHROPLASTY Right 06/24/2015   Procedure: TOTAL KNEE ARTHROPLASTY;  Surgeon: Gean Birchwood, MD;  Location: [facility] OR;  Service: Orthopedics;  Laterality: Right;  .  TOTAL KNEE ARTHROPLASTY Left 06/21/2017   Procedure: LEFT TOTAL KNEE ARTHROPLASTY;  Surgeon: Gean Birchwood, MD;  Location: [facility] OR;  Service: Orthopedics;  Laterality: Left;    OB History    No data available       Home Medications    Prior to Admission medications   Medication Sig Start Date End Date Taking? Authorizing Provider  aspirin EC 325 MG tablet Take 1 tablet (325 mg total) by mouth 2 (two) times daily. 06/21/17   Allena Katz, PA-C  atorvastatin (LIPITOR) 20 MG tablet Take 20 mg by mouth at bedtime.     [provider]  gabapentin (NEURONTIN) 300 MG capsule Take 300 mg by mouth 3 (three) times daily.    [provider]  lisinopril (PRINIVIL,ZESTRIL) 20 MG tablet Take 20 mg by mouth daily.    [provider]  methocarbamol (ROBAXIN) 500 MG tablet Take 1 tablet (500 mg total) by mouth every 6 (six) hours as needed for muscle spasms. 10/07/16   Barnett Abu, MD  Multiple Vitamins-Minerals (ADULT GUMMY PO) Take 2 tablets by mouth daily.    [provider]  oxyCODONE-acetaminophen (ROXICET) 5-325 MG tablet Take 1 tablet by mouth every 4 (four) hours as needed. 06/21/17   Allena Katz, PA-C  polyethylene glycol [facility] / GLYCOLAX) packet Take 17 g by mouth 2 (two) times daily.     [provider]  ranitidine (ZANTAC) 150 MG tablet Take 150 mg by mouth at bedtime.     [provider]  traMADol (ULTRAM) 50 MG tablet Take 50-100 mg by mouth every 8 (eight) hours. 1-2 tablets depending on pain    [provider]    Family History Family History  Problem Relation Age of Onset  . Hypertension Mother   . COPD Mother   . Diabetes Mother   . Heart disease Father   . Diabetes Father   . Hypertension Father     Social History Social History   Tobacco Use  . Smoking status: Former Smoker    Last attempt to quit: 06/18/1981    Years since quitting: 36.0  . Smokeless tobacco: Never Used  . Tobacco comment: Quit x  30 years  Substance Use Topics  . Alcohol use: No    Alcohol/week: 0.0 oz  . Drug use: No     Allergies   Decadron [dexamethasone]   Review of Systems Review of Systems  Cardiovascular: Positive for palpitations.       +elevated HR  Skin: Positive for wound (left knee surgical scar).  Neurological: Positive for light-headedness.  All other systems reviewed and are negative.    Physical Exam Updated Vital Signs BP 119/70   Pulse 76   Temp 97.9 F (36.6 C) (Oral)   Resp 13   Ht 5\' 5"  (1.651 m)   Wt 79.4 kg (175 lb)   SpO2 100%   BMI 29.12 kg/m   Physical Exam  Constitutional: She is oriented to person, place,  and time. She appears well-developed and well-nourished. No distress.  NAD. Non toxic.   HENT:  Head: Normocephalic and atraumatic.  Right Ear: External ear normal.  Left Ear: External ear normal.  Nose: Nose normal.  Mouth/Throat: Mucous membranes are dry.  Tongue is dry, buccal mucous membranes moist.   Eyes: Conjunctivae and EOM are normal. No scleral icterus.  Neck: Normal range of motion. Neck supple.  Cardiovascular: Regular rhythm and normal heart sounds. Tachycardia present.  No murmur heard. HR 98-105. BP WNL. No calf tenderness. 2+ DP and radial pulses bilaterally.   Pulmonary/Chest: Effort normal and breath sounds normal. She has no wheezes.  Abdominal: Soft. There is no tenderness.  No suprapubic or CVAT. No G/R/R.   Musculoskeletal: Normal range of motion. She exhibits edema and tenderness. She exhibits no deformity.  Mild local edema to left knee extending to about left mid calf, appropriately tender give recent surgery. Tolerates slow, PROM of left knee.   Neurological: She is alert and oriented to person, place, and time.  Skin: Skin is warm and dry. Capillary refill takes less than 2 seconds.  Well appearing left knee surgical scar well approximated w/o surrounding erythema or drainage.   Psychiatric: She has a normal mood and affect. Her  behavior is normal. Judgment and thought content normal.  Nursing note and vitals reviewed.    ED Treatments / Results  Labs (all labs ordered are listed, but only abnormal results are displayed) Labs Reviewed  CBC WITH DIFFERENTIAL/PLATELET - Abnormal; Notable for the following components:      Result Value   RBC 3.50 (*)    Hemoglobin 10.8 (*)    HCT 32.8 (*)    All other components within normal limits  BASIC METABOLIC PANEL - Abnormal; Notable for the following components:   Creatinine, Ser 1.11 (*)    GFR calc non Af Amer 52 (*)    All other components within normal limits  URINALYSIS, ROUTINE W REFLEX MICROSCOPIC - Abnormal; Notable for the following components:   Leukocytes, UA TRACE (*)    Bacteria, UA RARE (*)    Squamous Epithelial / LPF 0-5 (*)    All other components within normal limits  I-STAT CG4 LACTIC ACID, ED    EKG  EKG Interpretation  Date/Time:  Wednesday June 30 2017 07:01:48 EST Ventricular Rate:  110 PR Interval:    QRS Duration: 89 QT Interval:  321 QTC Calculation: 435 R Axis:   13 Text Interpretation:  Sinus tachycardia Borderline prolonged PR interval Low voltage, precordial leads Abnormal R-wave progression, early transition Baseline wander in lead(s) I rate is faster compared to Jun 18 2017 Confirmed by Pricilla Loveless [phone]) on 06/30/2017 7:19:03 AM       Radiology No results found.  Procedures Procedures (including critical care time)  Medications Ordered in ED Medications  sodium chloride 0.9 % bolus 1,000 mL (1,000 mLs Intravenous New Bag/Given 06/30/17 0747)     Initial Impression / Assessment and Plan / ED Course  I have reviewed the triage vital signs and the nursing notes.  Pertinent labs & imaging results that were available during my care of the patient were reviewed by me and considered in my medical decision making (see chart for details).  Clinical Course as of Jul 01 1047  Wed Jun 30, 2017  0803 Pulse Rate:  (!) 110 [CG]  0804 Pulse Rate: 84 [CG]    Clinical Course User Index [CG] Liberty Handy, PA-C   61 yo female  presents for evaluation of tachycardia. Recent total left knee arthroplasty 06/21/17. She reports positional light-headedness and palpitations most consistent with dehydration. She is also in pain from recent surgery. On aspirin. No symptoms to raise suspicion for infection. No h/o arrhythmias or CAD. On narcotic pain meds from surgery, no other recent med changes.   Exam reassuring. Lungs clear. Abdomen NTND. Surgical scar well appearing w/o evidence of infection. Left knee with local edema to left mid calf, but no calf tenderness.   Will obtain basic lab work, U/A, Baristaorthostatics and give IVF.   Final Clinical Impressions(s) / ED Diagnoses   ED work up reassuring. No evidence of leukocytosis, electrolyte abnormalities, UTI. She has no cough or URI illnesses to warrant chest x-ray. I think this would be low yield. No evidence of surgical incision infection. Patient was hydrated and pain was appropriately controlled, her heart rate has been persistently below 100 in the last 3 hours. She is asymptomatic. Suspect sinus tachycardia from mild dehydration and pain. Recent surgery puts her at higher risk for DVT however clinical exam not c/w with this. Tachycardia likely multifactorial. She has symptoms most consistent with dehydration. Also pain probably contributing.  Final diagnoses:  Tachycardia    ED Discharge Orders    None       Jerrell MylarGibbons,  J, PA-C 06/30/17 1048    Pricilla LovelessGoldston, Scott, MD 06/30/17 [phone]

## 2017-06-30 NOTE — ED Notes (Signed)
ED Provider at bedside. 

## 2017-06-30 NOTE — Discharge Instructions (Signed)
Lab work is reassuring without evidence of infection.   Your heart rate improved and stayed within normal range.   Stay well hydrated. Follow up with orthopedist as scheduled. Return to chest pain, shortness of breath, palpitations, dizziness.

## 2019-03-01 ENCOUNTER — Other Ambulatory Visit (HOSPITAL_COMMUNITY): Payer: Self-pay | Admitting: Neurological Surgery

## 2019-03-01 ENCOUNTER — Other Ambulatory Visit: Payer: Self-pay | Admitting: Neurological Surgery

## 2019-03-01 DIAGNOSIS — M5136 Other intervertebral disc degeneration, lumbar region: Secondary | ICD-10-CM

## 2019-03-07 ENCOUNTER — Other Ambulatory Visit: Payer: Self-pay

## 2019-03-07 ENCOUNTER — Ambulatory Visit (HOSPITAL_COMMUNITY)
Admission: RE | Admit: 2019-03-07 | Discharge: 2019-03-07 | Disposition: A | Discharge status: Home / Self Care | Payer: Medicare PPO | Source: Ambulatory Visit | Attending: Neurological Surgery | Admitting: Neurological Surgery

## 2019-03-07 DIAGNOSIS — M5136 Other intervertebral disc degeneration, lumbar region: Secondary | ICD-10-CM | POA: Diagnosis present

## 2019-03-09 NOTE — Pre-Procedure Instructions (Signed)
[facility] [place], [place] - [id number] [address] [place] [id number] Phone: [phone] Fax: [phone]  [facility] - [place], [place] - [address] [place] [address] Phone: [phone] Fax: [phone]    Your procedure is scheduled on Tues., Sept. 8, 2020 from 10:45AM-2:50PM  [facility]" at 8:45AM  Call this number if you have problems the morning of surgery:  [phone]   Remember:  Do not eat or drink after midnight on Sept. 7th    Take these medicines the morning of surgery with A SIP OF WATER: Gabapentin (NEURONTIN)  HYDROcodone-acetaminophen (NORCO/VICODIN)  Omeprazole (PRILOSEC)  As of today, stop taking all Aspirin (unless instructed by your doctor) and Other Aspirin containing products, Vitamins, Fish oils, and Herbal medications. Also stop all NSAIDS i.e. Advil, Ibuprofen, Motrin, Aleve, Anaprox, Naproxen, BC, Goody Powders, and all Supplements. Including: Celecoxib (CELEBREX)   Special instructions:   [place]- Preparing For Surgery  Before surgery, you can play an important role. Because skin is not sterile, your skin needs to be as free of germs as possible. You can reduce the number of germs on your skin by washing with CHG (chlorahexidine gluconate) Soap before surgery.  CHG is an antiseptic cleaner which kills germs and bonds with the skin to continue killing germs even after washing.    Please do not use if you have an allergy to CHG or antibacterial soaps. If your skin becomes reddened/irritated stop using the CHG.  Do not shave (including legs and underarms) for at least 48 hours prior to first CHG shower. It is OK to shave your face.  Please follow these instructions carefully.   1. Shower the NIGHT BEFORE SURGERY and the MORNING OF SURGERY with CHG.   2. If you chose to wash your hair, wash your hair first as usual with your normal  shampoo.  3. After you shampoo, rinse your hair and body thoroughly to remove the shampoo.  4. Use CHG as you would any other liquid soap. You can apply CHG directly to the skin and wash gently with a scrungie or a clean washcloth.   5. Apply the CHG Soap to your body ONLY FROM THE NECK DOWN.  Do not use on open wounds or open sores. Avoid contact with your eyes, ears, mouth and genitals (private parts). Wash Face and genitals (private parts)  with your normal soap.  6. Wash thoroughly, paying special attention to the area where your surgery will be performed.  7. Thoroughly rinse your body with warm water from the neck down.  8. DO NOT shower/wash with your normal soap after using and rinsing off the CHG Soap.  9. Pat yourself dry with a CLEAN TOWEL.  10. Wear CLEAN PAJAMAS to bed the night before surgery, wear comfortable clothes the morning of surgery  11. Place CLEAN SHEETS on your bed the night of your first shower and DO NOT SLEEP WITH PETS.   Day of Surgery:             Remember to brush your teeth WITH YOUR REGULAR TOOTHPASTE.  Do not wear jewelry, make-up or nail polish.  Do not wear lotions, powders, or perfumes, or deodorant.  Do not shave 48 hours prior to surgery.    Do not bring valuables to the hospital.  [facility] is not responsible for any belongings or valuables.  Contacts, dentures or bridgework may  not be worn into surgery.   For patients admitted to the hospital, discharge time will be determined by your treatment team.  Patients discharged the day of surgery will not be allowed to drive home.   Please wear clean clothes to the hospital/surgery center.    Please read over the following fact sheets that you were given. Pain Booklet, Coughing and Deep Breathing, MRSA Information and Surgical Site Infection Prevention

## 2019-03-10 ENCOUNTER — Other Ambulatory Visit: Payer: Self-pay

## 2019-03-10 ENCOUNTER — Encounter (HOSPITAL_COMMUNITY)
Admission: RE | Admit: 2019-03-10 | Discharge: 2019-03-10 | Disposition: A | Discharge status: Home / Self Care | Payer: Medicare PPO | Source: Ambulatory Visit | Attending: Neurological Surgery | Admitting: Neurological Surgery

## 2019-03-10 ENCOUNTER — Encounter (HOSPITAL_COMMUNITY): Payer: Self-pay

## 2019-03-10 ENCOUNTER — Other Ambulatory Visit (HOSPITAL_COMMUNITY)
Admission: RE | Admit: 2019-03-10 | Discharge: 2019-03-10 | Disposition: A | Discharge status: Home / Self Care | Payer: Medicare PPO | Source: Ambulatory Visit | Attending: Neurological Surgery | Admitting: Neurological Surgery

## 2019-03-10 DIAGNOSIS — Z01818 Encounter for other preprocedural examination: Secondary | ICD-10-CM | POA: Insufficient documentation

## 2019-03-10 DIAGNOSIS — I10 Essential (primary) hypertension: Secondary | ICD-10-CM | POA: Insufficient documentation

## 2019-03-10 DIAGNOSIS — I440 Atrioventricular block, first degree: Secondary | ICD-10-CM | POA: Insufficient documentation

## 2019-03-10 DIAGNOSIS — Z20828 Contact with and (suspected) exposure to other viral communicable diseases: Secondary | ICD-10-CM | POA: Diagnosis not present

## 2019-03-10 LAB — CBC
HCT: 38.5 % (ref 36.0–46.0)
Hemoglobin: 12.5 g/dL (ref 12.0–15.0)
MCH: 32.1 pg (ref 26.0–34.0)
MCHC: 32.5 g/dL (ref 30.0–36.0)
MCV: 99 fL (ref 80.0–100.0)
Platelets: 250 10*3/uL (ref 150–400)
RBC: 3.89 MIL/uL (ref 3.87–5.11)
RDW: 11.8 % (ref 11.5–15.5)
WBC: 4.7 10*3/uL (ref 4.0–10.5)
nRBC: 0 % (ref 0.0–0.2)

## 2019-03-10 LAB — SURGICAL PCR SCREEN
MRSA, PCR: POSITIVE — AB
Staphylococcus aureus: POSITIVE — AB

## 2019-03-10 LAB — BASIC METABOLIC PANEL
Anion gap: 11 (ref 5–15)
BUN: 20 mg/dL (ref 8–23)
CO2: 26 mmol/L (ref 22–32)
Calcium: 9.5 mg/dL (ref 8.9–10.3)
Chloride: 98 mmol/L (ref 98–111)
Creatinine, Ser: 0.98 mg/dL (ref 0.44–1.00)
GFR calc Af Amer: >60 mL/min (ref >60)
GFR calc non Af Amer: >60 mL/min (ref >60)
Glucose, Bld: 100 mg/dL — ABNORMAL HIGH (ref 70–99)
Potassium: 4 mmol/L (ref 3.5–5.1)
Sodium: 135 mmol/L (ref 135–145)

## 2019-03-10 NOTE — Progress Notes (Signed)
PCP -  Dr. Allyn Kenner Cardiologist - denies  Chest x-ray - N/A EKG - 03/10/19 Stress Test - denies ECHO - denies Cardiac Cath - denies  Sleep Study - denies CPAP - denies   Blood Thinner Instructions: N/A Aspirin Instructions: N/A  Anesthesia review: Yes; EKG w/1st degree AV block  Patient denies shortness of breath, fever, cough and chest pain at PAT appointment  Pt scheduled for COVID testing 03/10/19.  Pt states understanding of quarantine guidelines following test.  Coronavirus Screening  Have you experienced the following symptoms:  Cough yes/no: No Fever (>100.63F)  yes/no: No Runny nose yes/no: No Sore throat yes/no: No Difficulty breathing/shortness of breath  yes/no: No  Have you or a family member traveled in the last 14 days and where? yes/no: No   If the patient indicates "YES" to the above questions, their PAT will be rescheduled to limit the exposure to others and, the surgeon will be notified. THE PATIENT WILL NEED TO BE ASYMPTOMATIC FOR 14 DAYS.   If the patient is not experiencing any of these symptoms, the PAT nurse will instruct them to NOT bring anyone with them to their appointment since they may have these symptoms or traveled as well.   Please remind your patients and families that hospital visitation restrictions are in effect and the importance of the restrictions.  Patient verbalized understanding of instructions that were given to them at the PAT appointment. Patient was also instructed that they will need to review over the PAT instructions again at home before surgery.

## 2019-03-10 NOTE — Progress Notes (Signed)
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 Results called in to Dr. Clarice Pole office, VM left with Janett Billow, surgical scheduler. Callback number left for any questions.   Jacqlyn Larsen, RN

## 2019-03-11 LAB — NOVEL CORONAVIRUS, NAA (HOSP ORDER, SEND-OUT TO REF LAB; TAT 18-24 HRS): SARS-CoV-2, NAA: NOT DETECTED

## 2019-03-14 ENCOUNTER — Inpatient Hospital Stay (HOSPITAL_COMMUNITY)
Admission: RE | Admit: 2019-03-14 | Discharge: 2019-03-17 | DRG: 454 | Disposition: A | Discharge status: Home Health | Payer: Medicare PPO | Attending: Neurological Surgery | Admitting: Neurological Surgery

## 2019-03-14 ENCOUNTER — Inpatient Hospital Stay (HOSPITAL_COMMUNITY): Payer: Medicare PPO | Admitting: Physician Assistant

## 2019-03-14 ENCOUNTER — Inpatient Hospital Stay (HOSPITAL_COMMUNITY): Payer: Medicare PPO | Admitting: Anesthesiology

## 2019-03-14 ENCOUNTER — Other Ambulatory Visit: Payer: Self-pay

## 2019-03-14 ENCOUNTER — Inpatient Hospital Stay (HOSPITAL_COMMUNITY)
Admission: RE | Disposition: A | Discharge status: Home Health | Payer: Self-pay | Source: Home / Self Care | Attending: Neurological Surgery

## 2019-03-14 ENCOUNTER — Encounter (HOSPITAL_COMMUNITY): Payer: Self-pay

## 2019-03-14 ENCOUNTER — Inpatient Hospital Stay (HOSPITAL_COMMUNITY): Payer: Medicare PPO

## 2019-03-14 DIAGNOSIS — M4805 Spinal stenosis, thoracolumbar region: Secondary | ICD-10-CM | POA: Diagnosis present

## 2019-03-14 DIAGNOSIS — I10 Essential (primary) hypertension: Secondary | ICD-10-CM | POA: Diagnosis present

## 2019-03-14 DIAGNOSIS — Z96653 Presence of artificial knee joint, bilateral: Secondary | ICD-10-CM | POA: Diagnosis present

## 2019-03-14 DIAGNOSIS — M48061 Spinal stenosis, lumbar region without neurogenic claudication: Secondary | ICD-10-CM | POA: Diagnosis present

## 2019-03-14 DIAGNOSIS — Z825 Family history of asthma and other chronic lower respiratory diseases: Secondary | ICD-10-CM

## 2019-03-14 DIAGNOSIS — K5900 Constipation, unspecified: Secondary | ICD-10-CM | POA: Diagnosis present

## 2019-03-14 DIAGNOSIS — M4714 Other spondylosis with myelopathy, thoracic region: Secondary | ICD-10-CM | POA: Diagnosis present

## 2019-03-14 DIAGNOSIS — Z96643 Presence of artificial hip joint, bilateral: Secondary | ICD-10-CM | POA: Diagnosis present

## 2019-03-14 DIAGNOSIS — K219 Gastro-esophageal reflux disease without esophagitis: Secondary | ICD-10-CM | POA: Diagnosis present

## 2019-03-14 DIAGNOSIS — M4716 Other spondylosis with myelopathy, lumbar region: Secondary | ICD-10-CM | POA: Diagnosis present

## 2019-03-14 DIAGNOSIS — Z888 Allergy status to other drugs, medicaments and biological substances status: Secondary | ICD-10-CM | POA: Diagnosis not present

## 2019-03-14 DIAGNOSIS — M4724 Other spondylosis with radiculopathy, thoracic region: Secondary | ICD-10-CM | POA: Diagnosis present

## 2019-03-14 DIAGNOSIS — Z833 Family history of diabetes mellitus: Secondary | ICD-10-CM

## 2019-03-14 DIAGNOSIS — Z981 Arthrodesis status: Secondary | ICD-10-CM

## 2019-03-14 DIAGNOSIS — M4715 Other spondylosis with myelopathy, thoracolumbar region: Secondary | ICD-10-CM | POA: Diagnosis present

## 2019-03-14 DIAGNOSIS — Z87891 Personal history of nicotine dependence: Secondary | ICD-10-CM

## 2019-03-14 DIAGNOSIS — E785 Hyperlipidemia, unspecified: Secondary | ICD-10-CM | POA: Diagnosis present

## 2019-03-14 DIAGNOSIS — D62 Acute posthemorrhagic anemia: Secondary | ICD-10-CM | POA: Diagnosis not present

## 2019-03-14 DIAGNOSIS — Z791 Long term (current) use of non-steroidal anti-inflammatories (NSAID): Secondary | ICD-10-CM

## 2019-03-14 DIAGNOSIS — Z79891 Long term (current) use of opiate analgesic: Secondary | ICD-10-CM | POA: Diagnosis not present

## 2019-03-14 DIAGNOSIS — M4725 Other spondylosis with radiculopathy, thoracolumbar region: Secondary | ICD-10-CM | POA: Diagnosis present

## 2019-03-14 DIAGNOSIS — M4804 Spinal stenosis, thoracic region: Secondary | ICD-10-CM | POA: Diagnosis present

## 2019-03-14 DIAGNOSIS — M4726 Other spondylosis with radiculopathy, lumbar region: Secondary | ICD-10-CM | POA: Diagnosis present

## 2019-03-14 DIAGNOSIS — Z79899 Other long term (current) drug therapy: Secondary | ICD-10-CM

## 2019-03-14 DIAGNOSIS — Z8249 Family history of ischemic heart disease and other diseases of the circulatory system: Secondary | ICD-10-CM

## 2019-03-14 DIAGNOSIS — M519 Unspecified thoracic, thoracolumbar and lumbosacral intervertebral disc disorder: Secondary | ICD-10-CM

## 2019-03-14 HISTORY — PX: APPLICATION OF ROBOTIC ASSISTANCE FOR SPINAL PROCEDURE: SHX6753

## 2019-03-14 HISTORY — PX: ANTERIOR LAT LUMBAR FUSION: SHX1168

## 2019-03-14 HISTORY — PX: LUMBAR PERCUTANEOUS PEDICLE SCREW 1 LEVEL: SHX5560

## 2019-03-14 SURGERY — ANTERIOR LATERAL LUMBAR FUSION 1 LEVEL
Anesthesia: General | Site: Spine Lumbar

## 2019-03-14 MED ORDER — SENNA 8.6 MG PO TABS
1.0000 | ORAL_TABLET | Freq: Two times a day (BID) | ORAL | Status: DC
  Administered 2019-03-14 – 2019-03-17 (×5): 8.6 mg via ORAL
  Filled 2019-03-14 (×6): qty 1

## 2019-03-14 MED ORDER — MIDAZOLAM HCL 2 MG/2ML IJ SOLN
INTRAMUSCULAR | Status: AC
  Filled 2019-03-14: qty 2

## 2019-03-14 MED ORDER — MORPHINE SULFATE (PF) 2 MG/ML IV SOLN
INTRAVENOUS | Status: AC
  Filled 2019-03-14: qty 1

## 2019-03-14 MED ORDER — TRAMADOL HCL 50 MG PO TABS
50.0000 mg | ORAL_TABLET | Freq: Two times a day (BID) | ORAL | Status: DC
  Administered 2019-03-14 – 2019-03-17 (×5): 50 mg via ORAL
  Filled 2019-03-14 (×5): qty 1

## 2019-03-14 MED ORDER — BUPIVACAINE HCL (PF) 0.5 % IJ SOLN
INTRAMUSCULAR | Status: AC
  Filled 2019-03-14: qty 30

## 2019-03-14 MED ORDER — SODIUM CHLORIDE 0.9% FLUSH
3.0000 mL | Freq: Two times a day (BID) | INTRAVENOUS | Status: DC
  Administered 2019-03-14 – 2019-03-17 (×4): 3 mL via INTRAVENOUS

## 2019-03-14 MED ORDER — PHENOL 1.4 % MT LIQD: 1.0000 | OROMUCOSAL | Status: DC | PRN

## 2019-03-14 MED ORDER — SCOPOLAMINE 1 MG/3DAYS TD PT72
1.0000 | MEDICATED_PATCH | TRANSDERMAL | Status: DC
  Administered 2019-03-14: 10:00:00 1.5 mg via TRANSDERMAL

## 2019-03-14 MED ORDER — THROMBIN 5000 UNITS EX SOLR
CUTANEOUS | Status: AC
  Filled 2019-03-14: qty 5000

## 2019-03-14 MED ORDER — CEFAZOLIN SODIUM-DEXTROSE 2-4 GM/100ML-% IV SOLN
2.0000 g | INTRAVENOUS | Status: AC
  Administered 2019-03-14: 12:00:00 2 g via INTRAVENOUS

## 2019-03-14 MED ORDER — PHENYLEPHRINE 40 MCG/ML (10ML) SYRINGE FOR IV PUSH (FOR BLOOD PRESSURE SUPPORT)
PREFILLED_SYRINGE | INTRAVENOUS | Status: DC | PRN
  Administered 2019-03-14 (×2): 80 ug via INTRAVENOUS
  Administered 2019-03-14: 120 ug via INTRAVENOUS
  Administered 2019-03-14 (×2): 80 ug via INTRAVENOUS
  Administered 2019-03-14: 40 ug via INTRAVENOUS

## 2019-03-14 MED ORDER — MORPHINE SULFATE (PF) 2 MG/ML IV SOLN
2.0000 mg | INTRAVENOUS | Status: DC | PRN
  Administered 2019-03-14: 2 mg via INTRAVENOUS
  Administered 2019-03-14: 4 mg via INTRAVENOUS
  Administered 2019-03-15: 2 mg via INTRAVENOUS
  Filled 2019-03-14: qty 2
  Filled 2019-03-14: qty 1

## 2019-03-14 MED ORDER — FENTANYL CITRATE (PF) 100 MCG/2ML IJ SOLN
25.0000 ug | INTRAMUSCULAR | Status: DC | PRN
  Administered 2019-03-14: 50 ug via INTRAVENOUS
  Administered 2019-03-14 (×4): 25 ug via INTRAVENOUS

## 2019-03-14 MED ORDER — 0.9 % SODIUM CHLORIDE (POUR BTL) OPTIME
TOPICAL | Status: DC | PRN
  Administered 2019-03-14: 12:00:00 1000 mL

## 2019-03-14 MED ORDER — FLEET ENEMA 7-19 GM/118ML RE ENEM: 1.0000 | ENEMA | Freq: Once | RECTAL | Status: DC | PRN

## 2019-03-14 MED ORDER — BISACODYL 10 MG RE SUPP: 10.0000 mg | Freq: Every day | RECTAL | Status: DC | PRN

## 2019-03-14 MED ORDER — ROCURONIUM BROMIDE 10 MG/ML (PF) SYRINGE
PREFILLED_SYRINGE | INTRAVENOUS | Status: DC | PRN
  Administered 2019-03-14: 20 mg via INTRAVENOUS
  Administered 2019-03-14: 50 mg via INTRAVENOUS
  Administered 2019-03-14: 10 mg via INTRAVENOUS

## 2019-03-14 MED ORDER — HYDROCODONE-ACETAMINOPHEN 5-325 MG PO TABS
1.0000 | ORAL_TABLET | ORAL | Status: DC | PRN
  Administered 2019-03-14 – 2019-03-17 (×9): 2 via ORAL
  Filled 2019-03-14 (×9): qty 2

## 2019-03-14 MED ORDER — FENTANYL CITRATE (PF) 100 MCG/2ML IJ SOLN
INTRAMUSCULAR | Status: AC
  Filled 2019-03-14: qty 2

## 2019-03-14 MED ORDER — DOCUSATE SODIUM 100 MG PO CAPS
100.0000 mg | ORAL_CAPSULE | Freq: Two times a day (BID) | ORAL | Status: DC
  Administered 2019-03-14 – 2019-03-17 (×5): 100 mg via ORAL
  Filled 2019-03-14 (×6): qty 1

## 2019-03-14 MED ORDER — DEXAMETHASONE SODIUM PHOSPHATE 10 MG/ML IJ SOLN
INTRAMUSCULAR | Status: DC | PRN
  Administered 2019-03-14: 10 mg via INTRAVENOUS

## 2019-03-14 MED ORDER — SODIUM CHLORIDE 0.9 % IV SOLN: 250.0000 mL | INTRAVENOUS | Status: DC

## 2019-03-14 MED ORDER — BUPIVACAINE HCL (PF) 0.5 % IJ SOLN
INTRAMUSCULAR | Status: DC | PRN
  Administered 2019-03-14: 20 mL
  Administered 2019-03-14 (×2): 5 mL

## 2019-03-14 MED ORDER — FENTANYL CITRATE (PF) 250 MCG/5ML IJ SOLN
INTRAMUSCULAR | Status: AC
  Filled 2019-03-14: qty 5

## 2019-03-14 MED ORDER — SCOPOLAMINE 1 MG/3DAYS TD PT72
MEDICATED_PATCH | TRANSDERMAL | Status: AC
  Administered 2019-03-14: 10:00:00 1.5 mg via TRANSDERMAL
  Filled 2019-03-14: qty 1

## 2019-03-14 MED ORDER — SODIUM CHLORIDE 0.9% FLUSH
3.0000 mL | INTRAVENOUS | Status: DC | PRN
  Administered 2019-03-14: 3 mL via INTRAVENOUS
  Filled 2019-03-14: qty 3

## 2019-03-14 MED ORDER — METHOCARBAMOL 500 MG PO TABS
500.0000 mg | ORAL_TABLET | Freq: Four times a day (QID) | ORAL | Status: DC | PRN
  Administered 2019-03-14 – 2019-03-17 (×4): 500 mg via ORAL
  Filled 2019-03-14 (×4): qty 1

## 2019-03-14 MED ORDER — FENTANYL CITRATE (PF) 250 MCG/5ML IJ SOLN
INTRAMUSCULAR | Status: DC | PRN
  Administered 2019-03-14 (×3): 50 ug via INTRAVENOUS
  Administered 2019-03-14: 100 ug via INTRAVENOUS

## 2019-03-14 MED ORDER — ALBUMIN HUMAN 5 % IV SOLN
INTRAVENOUS | Status: DC | PRN
  Administered 2019-03-14 (×2): via INTRAVENOUS

## 2019-03-14 MED ORDER — HYDROCHLOROTHIAZIDE 25 MG PO TABS
25.0000 mg | ORAL_TABLET | Freq: Every day | ORAL | Status: DC
  Administered 2019-03-15 – 2019-03-17 (×2): 25 mg via ORAL
  Filled 2019-03-14 (×3): qty 1

## 2019-03-14 MED ORDER — CHLORHEXIDINE GLUCONATE CLOTH 2 % EX PADS: 6.0000 | MEDICATED_PAD | Freq: Once | CUTANEOUS | Status: DC

## 2019-03-14 MED ORDER — CEFAZOLIN SODIUM-DEXTROSE 2-4 GM/100ML-% IV SOLN
2.0000 g | Freq: Three times a day (TID) | INTRAVENOUS | Status: AC
  Administered 2019-03-14 – 2019-03-15 (×2): 2 g via INTRAVENOUS
  Filled 2019-03-14 (×2): qty 100

## 2019-03-14 MED ORDER — POLYETHYLENE GLYCOL 3350 17 G PO PACK
17.0000 g | PACK | Freq: Every day | ORAL | Status: DC
  Administered 2019-03-14 – 2019-03-17 (×4): 17 g via ORAL
  Filled 2019-03-14 (×4): qty 1

## 2019-03-14 MED ORDER — ACETAMINOPHEN 325 MG PO TABS
650.0000 mg | ORAL_TABLET | ORAL | Status: DC | PRN
  Administered 2019-03-15: 650 mg via ORAL
  Filled 2019-03-14 (×2): qty 2

## 2019-03-14 MED ORDER — LACTATED RINGERS IV SOLN
INTRAVENOUS | Status: DC
  Administered 2019-03-14 – 2019-03-15 (×3): via INTRAVENOUS

## 2019-03-14 MED ORDER — MENTHOL 3 MG MT LOZG: 1.0000 | LOZENGE | OROMUCOSAL | Status: DC | PRN

## 2019-03-14 MED ORDER — LIDOCAINE 2% (20 MG/ML) 5 ML SYRINGE
INTRAMUSCULAR | Status: DC | PRN
  Administered 2019-03-14: 60 mg via INTRAVENOUS

## 2019-03-14 MED ORDER — EPHEDRINE SULFATE-NACL 50-0.9 MG/10ML-% IV SOSY
PREFILLED_SYRINGE | INTRAVENOUS | Status: DC | PRN
  Administered 2019-03-14 (×4): 5 mg via INTRAVENOUS

## 2019-03-14 MED ORDER — ONDANSETRON HCL 4 MG/2ML IJ SOLN
INTRAMUSCULAR | Status: DC | PRN
  Administered 2019-03-14 (×2): 4 mg via INTRAVENOUS

## 2019-03-14 MED ORDER — SODIUM CHLORIDE 0.9 % IV SOLN
INTRAVENOUS | Status: DC | PRN
  Administered 2019-03-14: 12:00:00 50 ug/min via INTRAVENOUS

## 2019-03-14 MED ORDER — PROMETHAZINE HCL 25 MG/ML IJ SOLN: 6.2500 mg | INTRAMUSCULAR | Status: DC | PRN

## 2019-03-14 MED ORDER — LISINOPRIL-HYDROCHLOROTHIAZIDE 20-25 MG PO TABS: 1.0000 | ORAL_TABLET | Freq: Every day | ORAL | Status: DC

## 2019-03-14 MED ORDER — SUCCINYLCHOLINE CHLORIDE 20 MG/ML IJ SOLN
INTRAMUSCULAR | Status: DC | PRN
  Administered 2019-03-14: 100 mg via INTRAVENOUS

## 2019-03-14 MED ORDER — LISINOPRIL 20 MG PO TABS
20.0000 mg | ORAL_TABLET | Freq: Every day | ORAL | Status: DC
  Administered 2019-03-15 – 2019-03-17 (×2): 20 mg via ORAL
  Filled 2019-03-14 (×3): qty 1

## 2019-03-14 MED ORDER — PROPOFOL 500 MG/50ML IV EMUL
INTRAVENOUS | Status: DC | PRN
  Administered 2019-03-14: 50 ug/kg/min via INTRAVENOUS

## 2019-03-14 MED ORDER — LACTATED RINGERS IV SOLN
INTRAVENOUS | Status: DC
  Administered 2019-03-14 (×2): via INTRAVENOUS

## 2019-03-14 MED ORDER — LIDOCAINE-EPINEPHRINE 1 %-1:100000 IJ SOLN
INTRAMUSCULAR | Status: DC | PRN
  Administered 2019-03-14 (×2): 5 mL

## 2019-03-14 MED ORDER — KETOROLAC TROMETHAMINE 15 MG/ML IJ SOLN
7.5000 mg | Freq: Four times a day (QID) | INTRAMUSCULAR | Status: AC
  Administered 2019-03-14 – 2019-03-15 (×4): 7.5 mg via INTRAVENOUS
  Filled 2019-03-14 (×4): qty 1

## 2019-03-14 MED ORDER — ACETAMINOPHEN 10 MG/ML IV SOLN
INTRAVENOUS | Status: DC | PRN
  Administered 2019-03-14: 1000 mg via INTRAVENOUS

## 2019-03-14 MED ORDER — ONDANSETRON HCL 4 MG PO TABS
4.0000 mg | ORAL_TABLET | Freq: Four times a day (QID) | ORAL | Status: DC | PRN
  Administered 2019-03-15: 4 mg via ORAL
  Filled 2019-03-14: qty 1

## 2019-03-14 MED ORDER — ONDANSETRON HCL 4 MG/2ML IJ SOLN: 4.0000 mg | Freq: Four times a day (QID) | INTRAMUSCULAR | Status: DC | PRN

## 2019-03-14 MED ORDER — PANTOPRAZOLE SODIUM 40 MG PO TBEC
40.0000 mg | DELAYED_RELEASE_TABLET | Freq: Every day | ORAL | Status: DC
  Administered 2019-03-15 – 2019-03-17 (×3): 40 mg via ORAL
  Filled 2019-03-14 (×3): qty 1

## 2019-03-14 MED ORDER — ACETAMINOPHEN 650 MG RE SUPP: 650.0000 mg | RECTAL | Status: DC | PRN

## 2019-03-14 MED ORDER — GABAPENTIN 300 MG PO CAPS
300.0000 mg | ORAL_CAPSULE | Freq: Three times a day (TID) | ORAL | Status: DC
  Administered 2019-03-14 – 2019-03-17 (×8): 300 mg via ORAL
  Filled 2019-03-14 (×8): qty 1

## 2019-03-14 MED ORDER — LIDOCAINE-EPINEPHRINE 1 %-1:100000 IJ SOLN
INTRAMUSCULAR | Status: AC
  Filled 2019-03-14: qty 1

## 2019-03-14 MED ORDER — PROPOFOL 10 MG/ML IV BOLUS
INTRAVENOUS | Status: DC | PRN
  Administered 2019-03-14: 130 mg via INTRAVENOUS
  Administered 2019-03-14: 50 mg via INTRAVENOUS

## 2019-03-14 MED ORDER — METHOCARBAMOL 1000 MG/10ML IJ SOLN
500.0000 mg | Freq: Four times a day (QID) | INTRAVENOUS | Status: DC | PRN
  Filled 2019-03-14: qty 5

## 2019-03-14 MED ORDER — LACTATED RINGERS IV SOLN
INTRAVENOUS | Status: DC | PRN
  Administered 2019-03-14: 12:00:00 via INTRAVENOUS

## 2019-03-14 MED ORDER — POLYETHYLENE GLYCOL 3350 17 G PO PACK: 17.0000 g | PACK | Freq: Every day | ORAL | Status: DC | PRN

## 2019-03-14 MED ORDER — SUGAMMADEX SODIUM 200 MG/2ML IV SOLN
INTRAVENOUS | Status: DC | PRN
  Administered 2019-03-14: 200 mg via INTRAVENOUS

## 2019-03-14 MED ORDER — CEFAZOLIN SODIUM-DEXTROSE 2-4 GM/100ML-% IV SOLN
INTRAVENOUS | Status: AC
  Filled 2019-03-14: qty 100

## 2019-03-14 MED ORDER — THROMBIN 5000 UNITS EX SOLR
OROMUCOSAL | Status: DC | PRN
  Administered 2019-03-14 (×3): 5 mL via TOPICAL

## 2019-03-14 MED ORDER — ATORVASTATIN CALCIUM 40 MG PO TABS
40.0000 mg | ORAL_TABLET | Freq: Every day | ORAL | Status: DC
  Administered 2019-03-14 – 2019-03-16 (×3): 40 mg via ORAL
  Filled 2019-03-14 (×3): qty 1

## 2019-03-14 MED ORDER — MIDAZOLAM HCL 5 MG/5ML IJ SOLN
INTRAMUSCULAR | Status: DC | PRN
  Administered 2019-03-14 (×2): 1 mg via INTRAVENOUS

## 2019-03-14 SURGICAL SUPPLY — 107 items
BAG DECANTER FOR FLEXI CONT (MISCELLANEOUS) ×1 IMPLANT
BENZOIN TINCTURE PRP APPL 2/3 (GAUZE/BANDAGES/DRESSINGS) ×1 IMPLANT
BIT DRILL LONG 3.0X30 (BIT) IMPLANT
BIT DRILL LONG 3.0X30MM (BIT)
BIT DRILL LONG 3X80 (BIT) IMPLANT
BIT DRILL LONG 3X80MM (BIT)
BIT DRILL LONG 4X80 (BIT) IMPLANT
BIT DRILL LONG 4X80MM (BIT)
BIT DRILL SHORT 3.0X30 (BIT) IMPLANT
BIT DRILL SHORT 3.0X30MM (BIT)
BIT DRILL SHORT 3X80 (BIT) IMPLANT
BIT DRILL SHORT 3X80MM (BIT)
BLADE CLIPPER SURG (BLADE) IMPLANT
BLADE SURG 11 STRL SS (BLADE) ×1 IMPLANT
BONE CANC CHIPS 40CC CAN1/2 (Bone Implant) ×3 IMPLANT
BONE MATRIX OSTEOCEL PRO MED (Bone Implant) ×2 IMPLANT
CAGE MODULUS XL 10X18X45 - 10 (Cage) ×2 IMPLANT
CARTRIDGE OIL MAESTRO DRILL (MISCELLANEOUS) ×1 IMPLANT
CEMENT BONE KYPHX HV R (Orthopedic Implant) ×2 IMPLANT
CHIPS CANC BONE 40CC CAN1/2 (Bone Implant) ×1 IMPLANT
CLIP NEUROVISION LG (CLIP) ×2 IMPLANT
CLOSURE WOUND 1/2 X4 (GAUZE/BANDAGES/DRESSINGS)
CONT SPEC 4OZ CLIKSEAL STRL BL (MISCELLANEOUS) ×3 IMPLANT
COVER BACK TABLE 24X17X13 BIG (DRAPES) IMPLANT
COVER BACK TABLE 60X90IN (DRAPES) ×3 IMPLANT
COVER WAND RF STERILE (DRAPES) ×5 IMPLANT
DERMABOND ADVANCED (GAUZE/BANDAGES/DRESSINGS) ×4
DERMABOND ADVANCED .7 DNX12 (GAUZE/BANDAGES/DRESSINGS) ×3 IMPLANT
DIFFUSER DRILL AIR PNEUMATIC (MISCELLANEOUS) ×1 IMPLANT
DIGITIZER BENDINI (MISCELLANEOUS) ×2 IMPLANT
DRAPE C-ARM 42X72 X-RAY (DRAPES) ×6 IMPLANT
DRAPE C-ARMOR (DRAPES) ×6 IMPLANT
DRAPE LAPAROTOMY 100X72X124 (DRAPES) ×6 IMPLANT
DRAPE POUCH INSTRU U-SHP 10X18 (DRAPES) ×6 IMPLANT
DRAPE SHEET LG 3/4 BI-LAMINATE (DRAPES) ×3 IMPLANT
DRAPE SURG 17X23 STRL (DRAPES) ×3 IMPLANT
DURAPREP 26ML APPLICATOR (WOUND CARE) ×6 IMPLANT
ELECT BLADE 4.0 EZ CLEAN MEGAD (MISCELLANEOUS)
ELECT REM PT RETURN 9FT ADLT (ELECTROSURGICAL) ×6
ELECTRODE BLDE 4.0 EZ CLN MEGD (MISCELLANEOUS) IMPLANT
ELECTRODE REM PT RTRN 9FT ADLT (ELECTROSURGICAL) ×2 IMPLANT
GAUZE 4X4 16PLY RFD (DISPOSABLE) IMPLANT
GAUZE SPONGE 4X4 12PLY STRL (GAUZE/BANDAGES/DRESSINGS) ×1 IMPLANT
GAUZE SPONGE 4X4 16PLY XRAY LF (GAUZE/BANDAGES/DRESSINGS) ×2 IMPLANT
GLOVE BIO SURGEON STRL SZ 6.5 (GLOVE) ×1 IMPLANT
GLOVE BIO SURGEONS STRL SZ 6.5 (GLOVE) ×1
GLOVE BIOGEL PI IND STRL 6.5 (GLOVE) IMPLANT
GLOVE BIOGEL PI IND STRL 7.0 (GLOVE) IMPLANT
GLOVE BIOGEL PI IND STRL 7.5 (GLOVE) IMPLANT
GLOVE BIOGEL PI IND STRL 8.5 (GLOVE) ×3 IMPLANT
GLOVE BIOGEL PI INDICATOR 6.5 (GLOVE) ×2
GLOVE BIOGEL PI INDICATOR 7.0 (GLOVE) ×4
GLOVE BIOGEL PI INDICATOR 7.5 (GLOVE) ×2
GLOVE BIOGEL PI INDICATOR 8.5 (GLOVE) ×6
GLOVE ECLIPSE 8.5 STRL (GLOVE) ×9 IMPLANT
GLOVE EXAM NITRILE XL STR (GLOVE) IMPLANT
GLOVE SURG SS PI 7.5 STRL IVOR (GLOVE) ×6 IMPLANT
GOWN STRL REUS W/ TWL LRG LVL3 (GOWN DISPOSABLE) IMPLANT
GOWN STRL REUS W/ TWL XL LVL3 (GOWN DISPOSABLE) ×3 IMPLANT
GOWN STRL REUS W/TWL 2XL LVL3 (GOWN DISPOSABLE) ×8 IMPLANT
GOWN STRL REUS W/TWL LRG LVL3 (GOWN DISPOSABLE) ×6
GOWN STRL REUS W/TWL XL LVL3 (GOWN DISPOSABLE)
GRAFT BNE CHIP CANC 1-8 40 (Bone Implant) IMPLANT
GUIDEWIRE NITINOL BEVEL TIP (WIRE) ×2 IMPLANT
HEMOSTAT POWDER KIT SURGIFOAM (HEMOSTASIS) ×6 IMPLANT
KIT BASIN OR (CUSTOM PROCEDURE TRAY) ×6 IMPLANT
KIT DILATOR XLIF 5 (KITS) ×1 IMPLANT
KIT SPINE MAZOR X ROBO DISP (MISCELLANEOUS) ×3 IMPLANT
KIT SURGICAL ACCESS MAXCESS 4 (KITS) ×2 IMPLANT
KIT TURNOVER KIT B (KITS) ×6 IMPLANT
KIT XLIF (KITS) ×1
MARKER SKIN DUAL TIP RULER LAB (MISCELLANEOUS) ×3 IMPLANT
MIXER KYPHON (MISCELLANEOUS) ×2 IMPLANT
MODULE NVM5 NEXT GEN EMG (NEEDLE) ×2 IMPLANT
NDL HYPO 25X1 1.5 SAFETY (NEEDLE) ×2 IMPLANT
NDL RELINE-OR FENS 16 (NEEDLE) IMPLANT
NEEDLE HYPO 25X1 1.5 SAFETY (NEEDLE) ×6 IMPLANT
NEEDLE RELINE-OR FENS 16 (NEEDLE) ×18 IMPLANT
NS IRRIG 1000ML POUR BTL (IV SOLUTION) ×6 IMPLANT
OIL CARTRIDGE MAESTRO DRILL (MISCELLANEOUS)
PACK LAMINECTOMY NEURO (CUSTOM PROCEDURE TRAY) ×6 IMPLANT
PAD ARMBOARD 7.5X6 YLW CONV (MISCELLANEOUS) ×9 IMPLANT
PATTIES SURGICAL .5 X.5 (GAUZE/BANDAGES/DRESSINGS) IMPLANT
PATTIES SURGICAL .5 X1 (DISPOSABLE) IMPLANT
PATTIES SURGICAL 1X1 (DISPOSABLE) IMPLANT
PIN HEAD 2.5X60MM (PIN) IMPLANT
PROBE BALL TIP NVM5 SNG USE (BALLOONS) ×2 IMPLANT
PUSHER RELINE FENS 36 OR (MISCELLANEOUS) ×12 IMPLANT
ROD RELINE-O 5.5X300 STRT NS (Rod) IMPLANT
ROD RELINE-O 5.5X300MM STRT (Rod) ×4 IMPLANT
SCREW LOCK RELINE 5.5 TULIP (Screw) ×20 IMPLANT
SCREW RELINE 4.5X40 2FC POLY (Screw) ×4 IMPLANT
SCREW RELINE PA 2FC 5.5X40 (Screw) ×4 IMPLANT
SCREW RELINE PA 2FC 5.5X45 (Screw) ×4 IMPLANT
SCREW SCHANZ SA 4.0MM (MISCELLANEOUS) IMPLANT
SPONGE LAP 4X18 RFD (DISPOSABLE) IMPLANT
STRIP CLOSURE SKIN 1/2X4 (GAUZE/BANDAGES/DRESSINGS) ×1 IMPLANT
SUT VIC AB 1 CT1 18XBRD ANBCTR (SUTURE) ×2 IMPLANT
SUT VIC AB 1 CT1 8-18 (SUTURE) ×2
SUT VIC AB 2-0 CP2 18 (SUTURE) ×7 IMPLANT
SUT VIC AB 3-0 SH 8-18 (SUTURE) ×7 IMPLANT
TIP FENS REPLACE RELINE (MISCELLANEOUS) ×12 IMPLANT
TOWEL GREEN STERILE (TOWEL DISPOSABLE) ×6 IMPLANT
TOWEL GREEN STERILE FF (TOWEL DISPOSABLE) ×6 IMPLANT
TRAY FOLEY MTR SLVR 16FR STAT (SET/KITS/TRAYS/PACK) ×4 IMPLANT
TUBE MAZOR SA REDUCTION (TUBING) ×3 IMPLANT
WATER STERILE IRR 1000ML POUR (IV SOLUTION) ×8 IMPLANT

## 2019-03-14 NOTE — Progress Notes (Signed)
Patient ID: Kathy Coleman, female   DOB: January 04, 1956, 63 y.o.   MRN: [id number] Vital signs are stable Motor function is intact Doing reasonably well postop

## 2019-03-14 NOTE — H&P (Addendum)
Kathy Coleman is an 63 y.o. female.   Chief Complaint: Back and bilateral lower extremity pain HPI: Kathy Coleman is a 63 year old individual who has had extensive problems with her lumbar spine in the past.  She initially had a spondylolisthesis at L5-S1 that required a ray cage fusion she healed well from that operation subsequently developed problems at L4-5 L3-4 and L2-3 each individually requiring surgical decompression and stabilization.  She most recently had L1-L2 Velp degenerative changes and she had a posterior decompression and fusion of that about 2 years ago.  Now she has degenerative changes at T12-L1 with advanced spondylosis.  I have advised that she will require surgical stabilization of the T12-L1 joint but because it crosses the thoracolumbar junction she will need stabilization the T10 posteriorly.  She is now admitted to undergo an anterolateral decompression with an X-LIF spacer and then posterior stabilization with pedicle fixation using robotic assistance from T10 to her previous construct at L2.  Past Medical History:  Diagnosis Date  . Arthritis   . Constipation    takes Miralax nightly  . GERD (gastroesophageal reflux disease)    takes Zantac daily  . History of blood transfusion   . History of kidney stones   . Hyperlipidemia    takes Atorvastatin daily  . Hypertension    takes Lisinopril daily  . Osteoarthritis    left hip  . Osteoarthritis of left knee   . PONV (postoperative nausea and vomiting)    in the past did not have with last surgery in december; did well w/scopalamine patch    Past Surgical History:  Procedure Laterality Date  . ABDOMINAL HYSTERECTOMY    . [place]  , 2010  17 LOW BACK    . BIOPSY  02/13/2016   Procedure: BIOPSY;  Surgeon: Daneil Dolin, MD;  Location: [facility];  Service: Endoscopy;;  Gastric and Esophageal biopsies  . CHOLECYSTECTOMY    . COLONOSCOPY  03/24/2006   Dr.Rourk- a couple of anal papillae, o/w normal rectum  with L sided diverticula, the remainder of the colonic mucosa appeared normal.   . COLONOSCOPY N/A 02/13/2016   Dr. Gala Romney: diverticulosis, melanosis coli   . ESOPHAGOGASTRODUODENOSCOPY  03/24/2006   Dr.Rourk- normal esophagus. a couple of antral erosions, o/w normal stomach, patent pylorus, normal D1,D2  . ESOPHAGOGASTRODUODENOSCOPY N/A 02/13/2016   Dr. Gala Romney: erythematous mucosa, normal D2, empiric dilation   . FOOT SURGERY     RIGHT  2014  . MALONEY DILATION N/A 02/13/2016   Procedure: Venia Minks DILATION;  Surgeon: Daneil Dolin, MD;  Location: [facility];  Service: Endoscopy;  Laterality: N/A;  . NECK SURGERY     2005, 2010  . TOTAL HIP ARTHROPLASTY Right 05/13/2015   Procedure: TOTAL HIP ARTHROPLASTY ANTERIOR APPROACH;  Surgeon: Frederik Pear, MD;  Location: [place];  Service: Orthopedics;  Laterality: Right;  . TOTAL HIP ARTHROPLASTY Left 06/15/2016   Procedure: TOTAL HIP ARTHROPLASTY ANTERIOR APPROACH;  Surgeon: Frederik Pear, MD;  Location: [place];  Service: Orthopedics;  Laterality: Left;  . TOTAL KNEE ARTHROPLASTY Right 06/24/2015   Procedure: TOTAL KNEE ARTHROPLASTY;  Surgeon: Frederik Pear, MD;  Location: [place];  Service: Orthopedics;  Laterality: Right;  . TOTAL KNEE ARTHROPLASTY Left 06/21/2017   Procedure: LEFT TOTAL KNEE ARTHROPLASTY;  Surgeon: Frederik Pear, MD;  Location: [place];  Service: Orthopedics;  Laterality: Left;    Family History  Problem Relation Age of Onset  . Hypertension Mother   .  COPD Mother   . Diabetes Mother   . Heart disease Father   . Diabetes Father   . Hypertension Father    Social History:  reports that she quit smoking about 37 years ago. She has never used smokeless tobacco. She reports that she does not drink alcohol or use drugs.  Allergies:  Allergies  Allergen Reactions  . Decadron [Dexamethasone] Other (See Comments)    Severe burning in perineal area    Medications Prior to Admission  Medication Sig Dispense Refill  . atorvastatin  (LIPITOR) 40 MG tablet Take 40 mg by mouth at bedtime.    . celecoxib (CELEBREX) 200 MG capsule Take 200 mg by mouth daily.    Marland Kitchen. gabapentin (NEURONTIN) 300 MG capsule Take 300 mg by mouth 3 (three) times daily.    Marland Kitchen. HYDROcodone-acetaminophen (NORCO/VICODIN) 5-325 MG tablet Take 1 tablet by mouth 2 (two) times daily.    Marland Kitchen. lisinopril-hydrochlorothiazide (ZESTORETIC) 20-25 MG tablet Take 1 tablet by mouth daily.    . Multiple Vitamins-Minerals (ADULT GUMMY PO) Take 2 tablets by mouth daily.    Marland Kitchen. omeprazole (PRILOSEC) 20 MG capsule Take 20 mg by mouth daily as needed (acid reflux/indigestion.).    Marland Kitchen. polyethylene glycol (MIRALAX / GLYCOLAX) packet Take 17 g by mouth daily.     . traMADol (ULTRAM) 50 MG tablet Take 50 mg by mouth 2 (two) times daily.       No results found for this or any previous visit (from the past 48 hour(s)). No results found.  Review of Systems  Constitutional: Negative.   HENT: Negative.   Eyes: Negative.   Respiratory: Negative.   Cardiovascular: Negative.   Gastrointestinal: Negative.   Genitourinary: Negative.   Musculoskeletal: Positive for back pain.  Skin: Negative.   Neurological: Positive for tingling and focal weakness.  Endo/Heme/Allergies: Negative.   Psychiatric/Behavioral: Negative.     Blood pressure (!) 153/71, pulse 65, temperature 98 F (36.7 C), resp. rate 20, height 5\' 5"  (1.651 m), weight 77.1 kg, SpO2 100 %. Physical Exam  Constitutional: She appears well-developed and well-nourished.  HENT:  Head: Normocephalic.  Eyes: Pupils are equal, round, and reactive to light. Conjunctivae and EOM are normal.  Neck: Normal range of motion. Neck supple.  Cardiovascular: Normal rate and regular rhythm.  Respiratory: Effort normal and breath sounds normal.  GI: Soft. Bowel sounds are normal.  Musculoskeletal:     Comments: See neurologic below  Neurological: She is alert.  Motor strength good in the iliopsoas and the quadriceps bilaterally.  Absent  patellar and Achilles reflexes.  Straight leg raising is positive at 45 degrees in either lower extremity.  Patrick's maneuver is negative bilaterally.  Cranial nerve and cerebellar examination are normal.  Skin: Skin is warm and dry.  Psychiatric: She has a normal mood and affect. Her behavior is normal. Thought content normal.     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  Plan: Anterolateral decompression with X-LIF T12 L1.  Posterior fixation from T10-L2 with allograft arthrodesis.  Stefani DamaHenry J , MD 03/14/2019, 10:21 AM

## 2019-03-14 NOTE — Op Note (Signed)
Date of surgery: 03/14/2019 Preoperative diagnosis: Spondylosis T12-L1 status post decompression and fusion L1 to sacrum.  Low back pain, radiculopathy Postoperative diagnosis: Same Procedure: Anterolateral decompression of T12-L1 with placement of ex-LIF spacer and ostial cell allograft with neural monitoring.  Posterior segmental fixation from T10 to previous fusion L2 to sacrum.  Robotic assistance of pedicle screw placement and allograft arthrodesis T10-L2 posterior laterally. Surgeon: Barnett Abu First Assistant: Lelon Perla, MD Anesthesia: General endotracheal Indications: The patient is a 63 year old individual whose had significant increase in back pain with deterioration in her level of function over the past several months time.  She has had a previous decompression fusion from L2 to the sacrum and now is developed degeneration at L1-L2.  She has subarticular stenosis at the L1-L2 level.  She has been advised regarding the need for surgical decompression using an anterolateral technique with placement of ex lift spacer followed by posterior fixation from T10-L2 and posterior lateral arthrodesis from T10-L2.  Procedure: The patient was brought to the operating room supine on a stretcher.  After the smooth induction of general endotracheal anesthesia she was turned onto the operating table in the right lateral decubitus position.  The patient was placed perpendicularly with orthogonal fluoroscopy being obtained to localize the T12-L1 level.  Once this was verified the patient was taped into position and a mark was made on the left side of the patient's back to note where the entry site would be orthogonally to the T12-L1 space.  Then 2 separate incisions were created 1 over the T12-L1 space and 1 posteriorly and inferiorly to access the retroperitoneal space.  By bluntly dissecting in the posterior region a hemostat was placed into the retroperitoneal space and then a finger was admitted into the  retroperitoneal space I could feel the undersurface of the ribs and the transverse processes in the upper lumbar vertebrae.  The lateral incision was opened and then a small blunt probe was placed through the fascia joining the retroperitoneal space and placing the probe over the T12-L1 disc space in the midportion.  A K wire was placed in this region care was taken to make sure that no electrical activity was noted from any of the surrounding lumbar plexus nerves.  Then a series of dilators was passed over the initial probe and further monitoring confirmed that no elements of the lumbar plexus were involved with this 120 mm deep retractor was then placed over the outer probe and again the area was verified for no neural involvement electrically and the retractor was then gradually opened over the T12-L1 disc space.  A shim was placed in the posterior aspect of the disc space under fluoroscopic guidance.  Then a discectomy was performed by opening the disc space with a 15 blade and a combination of curettes and rongeurs were used to evacuate some degenerated disc material there is not noted to be much disc material in the space series of dilators were then passed across the disc space to expand the space to first an 8 mm size and ultimately a 10 mm size is felt that a 10 mm lordotic spacer measuring 18 mm in anterior posterior direction with would fit best this was 45 mm in width.  The spacer was placed and filled with ostia cell.  Once it was placed under fluoroscopic guidance hemostasis in the soft tissues was checked the retractor was removed and then the fascia was closed with 2-0 Vicryl in interrupted fashion and 3-0 Vicryl was used to  close the subcuticular skin.  At this point blood loss was about 50 cc.  The patient was then turned onto a Jackson table in the prone position the back was prepped and draped sterilely.  Midline incision was created in the lower lumbar spine at the superior portion of the  incision the dissection was carried down to expose the hardware notably at L1 and L2.  Dissection was then carried superiorly to expose T12 T11 T10 and the lateral aspects hemostasis was achieved each step of the way the robotic arm had been attached to the [place] table earlier and once all the hardware was exposed then the robotic arm was attached to the patient through a spinous process clamp at L1.  The pedicles were then localized with the robotic arm after obtaining registration radiographs using the Mazor robot guidewires were placed in T10-T11 and T12.  Then 4.5 x 40 mm screws were placed in T10 5.5 x 40 mm screws were placed in T11 and 5.5 x 45 mm screws were placed in T12.  Because the bone did feel soft in these regions the screws were augmented with methacrylate at T10-T11 and T12.  This was done under direct fluoroscopic vision injecting 1 cc of cement into each pedicle screw.  Once this was accomplished the towers were removed from the pedicle screws and Bandini was used to precontoured the rods which measured 170 mm on each side.  The rods were placed in a neutral construct and tightened down in a neutral construct fashion no compression was added.  The posterior elements were then decorticated from T10 down to L1 and 40 cc of allograft chips were packed into the posterior region between the rods and the spinous processes on each side all the way down to L2.  Once this was accomplished hemostasis was checked final radiographs were obtained and the thoracodorsal fascia and lumbodorsal fascia was closed with #1 Vicryl in interrupted fashion blood loss for the second portion of the procedure neared thousand cc.  Patient was then returned to the recovery room in stable condition.

## 2019-03-14 NOTE — Progress Notes (Signed)
Pt admitted to the unit from pacu; pt alert and verbally responsive; back incision has open to air dermabond dsg. IV intact and transfusing. Foley intact and unclamped. Pt oriented to the unit and room; pt spouse at bedside; VSS: reported off to oncoming RN. Delia Heady RN   03/14/19 1900  Vitals  Temp 97.7 F (36.5 C)  Temp Source Oral  BP 114/60  BP Location Right Arm  BP Method Automatic  Patient Position (if appropriate) Lying  Pulse Rate 100  Pulse Rate Source Dinamap  Resp 16  Oxygen Therapy  SpO2 100 %  O2 Device Room Air  MEWS Score  MEWS RR 0  MEWS Pulse 0  MEWS Systolic 0  MEWS LOC 0  MEWS Temp 0  MEWS Score 0  MEWS Score Color Green

## 2019-03-14 NOTE — Anesthesia Preprocedure Evaluation (Signed)
Anesthesia Evaluation  Patient identified by MRN, date of birth, ID band Patient awake    Reviewed: Allergy & Precautions, NPO status , Patient's Chart, lab work & pertinent test results  History of Anesthesia Complications (+) PONV  Airway Mallampati: II  TM Distance: >3 FB     Dental  (+) Dental Advisory Given   Pulmonary former smoker,    breath sounds clear to auscultation       Cardiovascular hypertension, Pt. on medications  Rhythm:Regular Rate:Normal     Neuro/Psych  Neuromuscular disease    GI/Hepatic Neg liver ROS, GERD  ,  Endo/Other  negative endocrine ROS  Renal/GU negative Renal ROS     Musculoskeletal  (+) Arthritis ,   Abdominal   Peds  Hematology negative hematology ROS (+)   Anesthesia Other Findings   Reproductive/Obstetrics                             Lab Results  Component Value Date   WBC 4.7 03/10/2019   HGB 12.5 03/10/2019   HCT 38.5 03/10/2019   MCV 99.0 03/10/2019   PLT 250 03/10/2019   Lab Results  Component Value Date   CREATININE 0.98 03/10/2019   BUN 20 03/10/2019   NA 135 03/10/2019   K 4.0 03/10/2019   CL 98 03/10/2019   CO2 26 03/10/2019    Anesthesia Physical Anesthesia Plan  ASA: II  Anesthesia Plan: General   Post-op Pain Management:    Induction: Intravenous  PONV Risk Score and Plan: 4 or greater and Midazolam, Dexamethasone, Ondansetron, Treatment may vary due to age or medical condition and Scopolamine patch - Pre-op  Airway Management Planned: Oral ETT  Additional Equipment:   Intra-op Plan:   Post-operative Plan: Extubation in OR  Informed Consent: I have reviewed the patients History and Physical, chart, labs and discussed the procedure including the risks, benefits and alternatives for the proposed anesthesia with the patient or authorized representative who has indicated his/her understanding and acceptance.      Dental advisory given  Plan Discussed with: CRNA  Anesthesia Plan Comments:         Anesthesia Quick Evaluation

## 2019-03-14 NOTE — Anesthesia Procedure Notes (Signed)
Procedure Name: Intubation Date/Time: 03/14/2019 11:46 AM Performed by: Janene Harvey, CRNA Pre-anesthesia Checklist: Patient identified, Emergency Drugs available, Suction available and Patient being monitored Patient Re-evaluated:Patient Re-evaluated prior to induction Oxygen Delivery Method: Circle system utilized Preoxygenation: Pre-oxygenation with 100% oxygen Induction Type: IV induction Ventilation: Mask ventilation without difficulty Laryngoscope Size: Mac and 4 Grade View: Grade I Tube type: Oral Tube size: 7.0 mm Number of attempts: 1 Airway Equipment and Method: Stylet and Oral airway Placement Confirmation: ETT inserted through vocal cords under direct vision,  positive ETCO2 and breath sounds checked- equal and bilateral Secured at: 22 cm Tube secured with: Tape Dental Injury: Teeth and Oropharynx as per pre-operative assessment

## 2019-03-14 NOTE — Anesthesia Postprocedure Evaluation (Signed)
Anesthesia Post Note  Patient: Kathy Coleman  Procedure(s) Performed: THORACIC TWELVE TO LUMBAR ONE ANTEROLATERAL DECOMPRESSION (N/A Spine Lumbar) MAZOR GUIDED PEDICLE SCREWS THORACIC TEN TO Lumbar THREE (N/A Spine Lumbar) APPLICATION OF ROBOTIC ASSISTANCE FOR SPINAL PROCEDURE (N/A Spine Lumbar)     Patient location during evaluation: PACU Anesthesia Type: General Level of consciousness: awake Pain management: pain level controlled Vital Signs Assessment: post-procedure vital signs reviewed and stable Respiratory status: spontaneous breathing Cardiovascular status: stable Postop Assessment: no apparent nausea or vomiting Anesthetic complications: no    Last Vitals:  Vitals:   03/14/19 1645 03/14/19 1700  BP: 119/68 115/72  Pulse: 96 86  Resp: 19 17  Temp: (!) 36.2 C   SpO2: 100% 99%    Last Pain:  Vitals:   03/14/19 1645  TempSrc:   PainSc: Asleep                  

## 2019-03-14 NOTE — Transfer of Care (Signed)
Immediate Anesthesia Transfer of Care Note  Patient: Rilee L Attridge  Procedure(s) Performed: THORACIC TWELVE TO LUMBAR ONE ANTEROLATERAL DECOMPRESSION (N/A Spine Lumbar) MAZOR GUIDED PEDICLE SCREWS THORACIC TEN TO Lumbar THREE (N/A Spine Lumbar) APPLICATION OF ROBOTIC ASSISTANCE FOR SPINAL PROCEDURE (N/A Spine Lumbar)  Patient Location: PACU  Anesthesia Type:General  Level of Consciousness: drowsy  Airway & Oxygen Therapy: Patient Spontanous Breathing and Patient connected to face mask oxygen  Post-op Assessment: Report given to RN and Post -op Vital signs reviewed and stable  Post vital signs: Reviewed and stable  Last Vitals:  Vitals Value Taken Time  BP    Temp    Pulse    Resp    SpO2      Last Pain:  Vitals:   03/14/19 0905  TempSrc:   PainSc: 6          Complications: No apparent anesthesia complications

## 2019-03-15 ENCOUNTER — Encounter (HOSPITAL_COMMUNITY): Payer: Self-pay | Admitting: Neurological Surgery

## 2019-03-15 LAB — CBC
HCT: 21.8 % — ABNORMAL LOW (ref 36.0–46.0)
Hemoglobin: 7.5 g/dL — ABNORMAL LOW (ref 12.0–15.0)
MCH: 33 pg (ref 26.0–34.0)
MCHC: 34.4 g/dL (ref 30.0–36.0)
MCV: 96 fL (ref 80.0–100.0)
Platelets: 160 10*3/uL (ref 150–400)
RBC: 2.27 MIL/uL — ABNORMAL LOW (ref 3.87–5.11)
RDW: 11.8 % (ref 11.5–15.5)
WBC: 6.5 10*3/uL (ref 4.0–10.5)
nRBC: 0 % (ref 0.0–0.2)

## 2019-03-15 LAB — BASIC METABOLIC PANEL
Anion gap: 9 (ref 5–15)
BUN: 18 mg/dL (ref 8–23)
CO2: 24 mmol/L (ref 22–32)
Calcium: 8.1 mg/dL — ABNORMAL LOW (ref 8.9–10.3)
Chloride: 101 mmol/L (ref 98–111)
Creatinine, Ser: 1.07 mg/dL — ABNORMAL HIGH (ref 0.44–1.00)
GFR calc Af Amer: >60 mL/min (ref >60)
GFR calc non Af Amer: 55 mL/min — ABNORMAL LOW (ref >60)
Glucose, Bld: 151 mg/dL — ABNORMAL HIGH (ref 70–99)
Potassium: 4.5 mmol/L (ref 3.5–5.1)
Sodium: 134 mmol/L — ABNORMAL LOW (ref 135–145)

## 2019-03-15 LAB — PREPARE RBC (CROSSMATCH)

## 2019-03-15 MED ORDER — SODIUM CHLORIDE 0.9% IV SOLUTION
Freq: Once | INTRAVENOUS | Status: AC
  Administered 2019-03-15: 11:00:00 via INTRAVENOUS

## 2019-03-15 MED FILL — Gelatin Absorbable MT Powder: OROMUCOSAL | Qty: 1 | Status: AC

## 2019-03-15 MED FILL — Thrombin For Soln 5000 Unit: CUTANEOUS | Qty: 5000 | Status: AC

## 2019-03-15 NOTE — Progress Notes (Signed)
MD aware that blood bank will provide 1 only, due to shortage.

## 2019-03-15 NOTE — Progress Notes (Signed)
Patient ID: Kathy Coleman, female   DOB: 03-26-1956, 63 y.o.   MRN: [id number] Patient has slight tachycardia of 203 and 104 while at rest with a blood pressure of about 100/52.  She has not been out of bed yet however today's CBC reveals a hemoglobin of 7.5 and hematocrit of 21.8.  This is decreased from a hemoglobin of 13 and a hematocrit of high 30s secondary to acute blood loss anemia from surgery.  I have advised that she should undergo transfusion of red cells for 2 units to help improve her overall strength and stamina and prevent lightheadedness when she gets up.  She is agreeable to the transfusion.  Otherwise she seems to be doing well has moderate amount of back pain related to the surgery incision has been clean and dry.

## 2019-03-15 NOTE — Progress Notes (Signed)
Foley catheter removed, patient is due to void.

## 2019-03-15 NOTE — Progress Notes (Signed)
Orthopedic Tech Progress Note Patient Details:  [name] [birth year] [phone] RN said patient's husband is bringing in her brace. Patient ID: Kathy Coleman, female   DOB: October 11, 1955, 62 y.o.   MRN: [id number]   Janit Pagan 03/15/2019, 8:04 AM

## 2019-03-15 NOTE — Progress Notes (Signed)
Blood administration ended at 1520, vitals taken post administration, no reaction noted

## 2019-03-15 NOTE — Evaluation (Signed)
Physical Therapy Evaluation Patient Details Name: Kathy Coleman MRN: [id number] DOB: 17-Sep-1955 Today's Date: 03/15/2019   History of Present Illness  Pt is 63 yo female s/p thoracic twelve to lumbar one anterolateral decompression. PMH including  osteoarthritis, hypotension, back surgery (1998, 2010, 2018), neck surgery (2005, 2010), bilateral THA, and bilateral TKA.   Clinical Impression  Pt presents with an overall decrease in functional mobility secondary to above. PTA, pt was mod I with use of SPC and RW as needed for mobility. Educ on precautions, positioning, therex, and importance of mobility. Today, pt able to perform bed mobility with supervision and cues for precautions, performed sit<>stand with min guard assist and RW. Evaluation limited secondary to low BP and decreased hemoglobin level, pt about to receive blood transfusion- will assess gait as able. Pt would benefit from continued acute PT services to maximize functional mobility and independence prior to d/c home with home health follow up therapy.     Follow Up Recommendations Home health PT    Equipment Recommendations  None recommended by PT    Recommendations for Other Services       Precautions / Restrictions Precautions Precautions: Back Precaution Booklet Issued: Yes (comment) Precaution Comments: Reviewed back precautions Required Braces or Orthoses: Spinal Brace Spinal Brace: Lumbar corset Restrictions Weight Bearing Restrictions: No      Mobility  Bed Mobility Overal bed mobility: Needs Assistance   Rolling: Supervision Sidelying to sit: Supervision     Sit to sidelying: Supervision General bed mobility comments: Pt required increased time for all aspects of bed mobility, and required VCs to maintain back precautions when getting back into bed  Transfers Overall transfer level: Needs assistance Equipment used: Rolling walker (2 wheeled) Transfers: Sit to/from Stand Sit to Stand: Min guard          General transfer comment: Pt required min guard A for safety.  Ambulation/Gait Ambulation/Gait assistance: (unable to assess gait on eval secondary to drop in BP (97/60 in sitting to 84/45 in standing, pt also about to recieve blood transfusion- will assess gait next session as pt can tolerate))              Stairs            Wheelchair Mobility    Modified Rankin (Stroke Patients Only)       Balance Overall balance assessment: Needs assistance Sitting-balance support: Feet supported Sitting balance-Leahy Scale: Good Sitting balance - Comments: no assist for static sitting balance and dynamic sitting balance while scooting hips to EOB   Standing balance support: Bilateral upper extremity supported Standing balance-Leahy Scale: Poor Standing balance comment: reliant on UE support using RW this session                             Pertinent Vitals/Pain Faces Pain Scale: Hurts little more Pain Location: back Pain Descriptors / Indicators: Aching;Cramping;Discomfort;Grimacing Pain Intervention(s): Limited activity within patient's tolerance;Repositioned;Monitored during session    Home Living Family/patient expects to be discharged to:: Private residence Living Arrangements: Spouse/significant other Available Help at Discharge: Family Type of Home: House Home Access: Level entry     Home Layout: One level Home Equipment: Grab bars - tub/shower;Hand held shower head;Bedside commode;Shower seat;Adaptive equipment;Walker - 2 wheels Additional Comments: Spouse farms the land surrounding the house and can be available to provide assist as needed    Prior Function Level of Independence: Independent with assistive device(s)  Comments: Uses cane regularly, but more recently has been using walker     Hand Dominance   Dominant Hand: Right    Extremity/Trunk Assessment   Upper Extremity Assessment Upper Extremity Assessment: Overall WFL  for tasks assessed    Lower Extremity Assessment Lower Extremity Assessment: Generalized weakness    Cervical / Trunk Assessment Cervical / Trunk Assessment: Other exceptions Cervical / Trunk Exceptions: spnial fusion with LSO donned  Communication   Communication: No difficulties  Cognition Arousal/Alertness: Awake/alert Behavior During Therapy: WFL for tasks assessed/performed Overall Cognitive Status: Within Functional Limits for tasks assessed                                        General Comments      Exercises     Assessment/Plan    PT Assessment Patient needs continued PT services  PT Problem List Decreased strength;Decreased range of motion;Decreased mobility;Decreased activity tolerance;Decreased balance;Pain       PT Treatment Interventions DME instruction;Functional mobility training;Balance training;Patient/family education;Gait training;Therapeutic activities;Therapeutic exercise    PT Goals (Current goals can be found in the Care Plan section)  Acute Rehab PT Goals Patient Stated Goal: decrease pain and return home PT Goal Formulation: With patient Time For Goal Achievement: 03/29/19 Potential to Achieve Goals: Good    Frequency Min 5X/week   Barriers to discharge        Co-evaluation               AM-PAC PT "6 Clicks" Mobility  Outcome Measure Help needed turning from your back to your side while in a flat bed without using bedrails?: None Help needed moving from lying on your back to sitting on the side of a flat bed without using bedrails?: None Help needed moving to and from a bed to a chair (including a wheelchair)?: A Little Help needed standing up from a chair using your arms (e.g., wheelchair or bedside chair)?: A Little Help needed to walk in hospital room?: A Little Help needed climbing 3-5 steps with a railing? : A Little 6 Click Score: 20    End of Session Equipment Utilized During Treatment: Back  brace Activity Tolerance: Treatment limited secondary to medical complications (Comment)(low BP & decreased hemoglobin level) Patient left: in bed;with bed alarm set;with call bell/phone within reach;with family/visitor present Nurse Communication: Mobility status PT Visit Diagnosis: Unsteadiness on feet (R26.81);Muscle weakness (generalized) (M62.81)    Time: 1130-1141 PT Time Calculation (min) (ACUTE ONLY): 11 min   Charges:   PT Evaluation $PT Eval Low Complexity: 1 Low          Cresenciano GenreEmily van Schagen, PT, DPT Acute Rehab Office [phone]    Mindi Curlingmily van Schagen 03/15/2019, 1:13 PM

## 2019-03-15 NOTE — Progress Notes (Signed)
Patient states she does have a ASPEN brace which her husband will bring today about 10 AM

## 2019-03-15 NOTE — Evaluation (Signed)
Occupational Therapy Evaluation Patient Details Name: Kathy MediciMyra L Coleman MRN: [id number] DOB: Oct 06, 1955 Today's Date: 03/15/2019    History of Present Illness Pt is 63 yo female s/p thoracic twelve to lumbar one anterolateral decompression. PMH including  osteoarthritis, hypotension, back surgery (1998, 2010, 2018), neck surgery (2005, 2010), bilateral THA, and bilateral TKA.    Clinical Impression   PTA, pt was independent with the use of AE for ADLs and IADLs. She lives in one story home with her husband, who is available to provide assistance as needed. Pt presented with decreased activity tolerance due to pain. Pt required min guard A for bed mobility to maintain back precautions. She completed toileting and grooming tasks with supervision for safety. Recommend dc to home with intermittent supervision for safety. Will follow acutely as admitted for education with LB ADLs with use of AE and to facilitate safe dc home.      Follow Up Recommendations  No OT follow up;Supervision - Intermittent    Equipment Recommendations  None recommended by OT    Recommendations for Other Services       Precautions / Restrictions Precautions Precautions: Back Precaution Booklet Issued: Yes (comment) Precaution Comments: Reviewed back precautions Required Braces or Orthoses: Spinal Brace Spinal Brace: Lumbar corset Restrictions Weight Bearing Restrictions: No      Mobility Bed Mobility Overal bed mobility: Needs Assistance Bed Mobility: Rolling;Sidelying to Sit;Sit to Sidelying Rolling: Modified independent (Device/Increase time)(increase time) Sidelying to sit: Modified independent (Device/Increase time)(increased time)     Sit to sidelying: Min guard(Pt required min guard A and VCs to maintain back precautions) General bed mobility comments: Pt required increased time for all aspects of bed mobility, and required VCs to maintain back precautions when getting back into bed  Transfers Overall  transfer level: Needs assistance Equipment used: Rolling walker (2 wheeled) Transfers: Sit to/from Stand Sit to Stand: Min guard         General transfer comment: Pt required min guard A for safety    Balance Overall balance assessment: Needs assistance Sitting-balance support: Feet supported Sitting balance-Leahy Scale: Fair     Standing balance support: Single extremity supported;During functional activity Standing balance-Leahy Scale: Fair Standing balance comment: Pt requires supervision for safety                           ADL either performed or assessed with clinical judgement   ADL Overall ADL's : Needs assistance/impaired Eating/Feeding: Supervision/ safety;Set up;Sitting   Grooming: Wash/dry hands;Wash/dry face;Oral care;Standing;Cueing for compensatory techniques;Supervision/safety;Set up Grooming Details (indicate cue type and reason): Pt required supervision for balance and VCs to complete oral care while maintaining back precautions Upper Body Bathing: Supervision/ safety;Sitting;With adaptive equipment   Lower Body Bathing: Minimal assistance;Sit to/from stand;With adaptive equipment   Upper Body Dressing : Sitting;Supervision/safety(Brace not present, will need to provide education on brace)   Lower Body Dressing: Sit to/from stand;Moderate assistance Lower Body Dressing Details (indicate cue type and reason): will need to continue education for adaptive equipment Toilet Transfer: Ambulation;RW;Supervision/safety;BSC Toilet Transfer Details (indicate cue type and reason): Pt required supervision for safety and balance Toileting- Clothing Manipulation and Hygiene: Sit to/from stand;Supervision/safety Toileting - Clothing Manipulation Details (indicate cue type and reason): Pt required supervision for safety and balance Tub/ Shower Transfer: Ambulation;Shower seat;Min guard   Functional mobility during ADLs: Rolling walker;Supervision/safety General  ADL Comments: Provided education on back precautions, bed mobility, toileting, and lower body ADLs.     Vision Baseline  Vision/History: Wears glasses Patient Visual Report: No change from baseline       Perception     Praxis      Pertinent Vitals/Pain Pain Assessment: Faces Faces Pain Scale: Hurts little more Pain Location: Spine Pain Descriptors / Indicators: Aching;Cramping;Discomfort;Grimacing Pain Intervention(s): Monitored during session;Premedicated before session     Hand Dominance Right   Extremity/Trunk Assessment Upper Extremity Assessment Upper Extremity Assessment: Overall WFL for tasks assessed   Lower Extremity Assessment Lower Extremity Assessment: Defer to PT evaluation   Cervical / Trunk Assessment Cervical / Trunk Assessment: Normal   Communication Communication Communication: No difficulties   Cognition Arousal/Alertness: Awake/alert Behavior During Therapy: WFL for tasks assessed/performed Overall Cognitive Status: Within Functional Limits for tasks assessed                                     General Comments       Exercises     Shoulder Instructions      Home Living Family/patient expects to be discharged to:: Private residence Living Arrangements: Spouse/significant other Available Help at Discharge: Family Type of Home: House Home Access: Level entry     Home Layout: One level     Bathroom Shower/Tub: Producer, television/film/video: Handicapped height Bathroom Accessibility: Yes How Accessible: Accessible via walker Home Equipment: Grab bars - tub/shower;Hand held shower head;Walker - 4 wheels;Bedside commode;Shower seat;Adaptive equipment Adaptive Equipment: Sock aid;Long-handled shoe horn;Long-handled sponge;Reacher Additional Comments: Spouse farms the land surrounding the house and can be available to provide assist as needed      Prior Functioning/Environment Level of Independence: Independent with  assistive device(s)        Comments: Uses cane regularly, but more recently has been using walker        OT Problem List: Decreased strength;Decreased range of motion;Decreased knowledge of use of DME or AE;Pain      OT Treatment/Interventions: Self-care/ADL training;DME and/or AE instruction;Patient/family education    OT Goals(Current goals can be found in the care plan section) Acute Rehab OT Goals Patient Stated Goal: decrease pain and return home OT Goal Formulation: With patient Time For Goal Achievement: 03/29/19 Potential to Achieve Goals: Good  OT Frequency: Min 2X/week   Barriers to D/C:            Co-evaluation              AM-PAC OT "6 Clicks" Daily Activity     Outcome Measure Help from another person eating meals?: None Help from another person taking care of personal grooming?: A Little Help from another person toileting, which includes using toliet, bedpan, or urinal?: A Little Help from another person bathing (including washing, rinsing, drying)?: A Little Help from another person to put on and taking off regular upper body clothing?: A Little Help from another person to put on and taking off regular lower body clothing?: A Lot 6 Click Score: 18   End of Session Equipment Utilized During Treatment: Rolling walker Nurse Communication: Mobility status(Informed RN that pts husband will be bringing back brace.)  Activity Tolerance: Patient tolerated treatment well Patient left: in bed;with call bell/phone within reach  OT Visit Diagnosis: Pain Pain - Right/Left: Left Pain - part of body: (left side of abdomen at surgical site)                Time: 9326-7124 OT Time Calculation (min): 25 min Charges:  OT  General Charges $OT Visit: 1 Visit OT Evaluation $OT Eval Low Complexity: 1 Low OT Treatments $Self Care/Home Management : 8-22 mins  Gus Rankin, OT Student   Gus Rankin 03/15/2019, 10:16 AM

## 2019-03-16 LAB — CBC
HCT: 23.2 % — ABNORMAL LOW (ref 36.0–46.0)
Hemoglobin: 7.7 g/dL — ABNORMAL LOW (ref 12.0–15.0)
MCH: 32 pg (ref 26.0–34.0)
MCHC: 33.2 g/dL (ref 30.0–36.0)
MCV: 96.3 fL (ref 80.0–100.0)
Platelets: 129 10*3/uL — ABNORMAL LOW (ref 150–400)
RBC: 2.41 MIL/uL — ABNORMAL LOW (ref 3.87–5.11)
RDW: 13.2 % (ref 11.5–15.5)
WBC: 6.7 10*3/uL (ref 4.0–10.5)
nRBC: 0 % (ref 0.0–0.2)

## 2019-03-16 LAB — PREPARE RBC (CROSSMATCH)

## 2019-03-16 MED ORDER — SODIUM CHLORIDE 0.9% IV SOLUTION
Freq: Once | INTRAVENOUS | Status: AC
  Administered 2019-03-16: 23:00:00 via INTRAVENOUS

## 2019-03-16 NOTE — Progress Notes (Signed)
During  morning med pass patient BP was checked and reading was low at 0914 98/53 on right arm. Called Dr. Clarice Pole office to verify if he wanted to hold B/P medication at this time and recheck hgb. Awaiting drs callback.

## 2019-03-16 NOTE — Progress Notes (Signed)
Patient ID: Kathy Coleman, female   DOB: [birth year], 63 y.o.   MRN: [id number] Patient only received 1 unit of blood yesterday this raised her hemoglobin is 7.7 and her hematocrit to 23. She still has a symptomatic anemia feels extremely lightheaded when getting out of bed with sensation of weakness and fainting.  Furthermore blood pressure goes into the 80s and she becomes tachycardic.  At rest she is not tachycardic but her heart rate is 88 her blood pressures 100/67.  I have advised that she should undergo further transfusion of blood prefer to give HER-2 more units.  Her incision is clean and dry and her leg function is doing well she could otherwise go home.  We will reorder transfusion for tonight.

## 2019-03-16 NOTE — Plan of Care (Signed)
  Problem: Education: Goal: Ability to verbalize activity precautions or restrictions will improve Outcome: Progressing Goal: Knowledge of the prescribed therapeutic regimen will improve Outcome: Progressing Goal: Understanding of discharge needs will improve Outcome: Progressing   Problem: Bowel/Gastric: Goal: Gastrointestinal status for postoperative course will improve Outcome: Progressing   Problem: Clinical Measurements: Goal: Ability to maintain clinical measurements within normal limits will improve Outcome: Progressing Goal: Postoperative complications will be avoided or minimized Outcome: Progressing Goal: Diagnostic test results will improve Outcome: Progressing   Problem: Pain Management: Goal: Pain level will decrease Outcome: Progressing   Problem: Skin Integrity: Goal: Will show signs of wound healing Outcome: Progressing   Problem: Health Behavior/Discharge Planning: Goal: Identification of resources available to assist in meeting health care needs will improve Outcome: Progressing   Problem: Bladder/Genitourinary: Goal: Urinary functional status for postoperative course will improve Outcome: Progressing   Ival Bible, BSN, RN

## 2019-03-16 NOTE — Progress Notes (Signed)
Physical Therapy Treatment Patient Details Name: Kathy MediciMyra L Aguiniga MRN: [id number] DOB: [birth year] Today's Date: 03/16/2019    History of Present Illness Pt is 63 yo female s/p thoracic twelve to lumbar one anterolateral decompression. PMH including  osteoarthritis, hypotension, back surgery (1998, 2010, 2018), neck surgery (2005, 2010), bilateral THA, and bilateral TKA.     PT Comments    Patient seen for mobility progression. Pt is making progress toward PT goals and current plan remains appropriate.    Follow Up Recommendations  Home health PT     Equipment Recommendations  None recommended by PT    Recommendations for Other Services       Precautions / Restrictions Precautions Precautions: Back Precaution Comments: pt recalls 3/3 precautions  Required Braces or Orthoses: Spinal Brace Spinal Brace: Lumbar corset Restrictions Weight Bearing Restrictions: No    Mobility  Bed Mobility Overal bed mobility: Needs Assistance Bed Mobility: Rolling;Sidelying to Sit;Sit to Sidelying Rolling: Supervision Sidelying to sit: Supervision     Sit to sidelying: Supervision General bed mobility comments: use of rail  Transfers Overall transfer level: Needs assistance Equipment used: Rolling walker (2 wheeled) Transfers: Sit to/from Stand Sit to Stand: Min guard         General transfer comment: min guard for safety  Ambulation/Gait Ambulation/Gait assistance: Min guard Gait Distance (Feet): 180 Feet Assistive device: Rolling walker (2 wheeled) Gait Pattern/deviations: Step-through pattern;Decreased stride length Gait velocity: decreased   General Gait Details: decreased cadence but steady gait    Stairs             Wheelchair Mobility    Modified Rankin (Stroke Patients Only)       Balance Overall balance assessment: Needs assistance Sitting-balance support: Feet supported Sitting balance-Leahy Scale: Good     Standing balance support: Bilateral upper  extremity supported Standing balance-Leahy Scale: Fair                              Cognition Arousal/Alertness: Awake/alert Behavior During Therapy: WFL for tasks assessed/performed Overall Cognitive Status: Within Functional Limits for tasks assessed                                        Exercises      General Comments General comments (skin integrity, edema, etc.): spouse in room      Pertinent Vitals/Pain Pain Assessment: Faces Pain Score: 5  Faces Pain Scale: Hurts little more Pain Location: back Pain Descriptors / Indicators: Discomfort;Grimacing Pain Intervention(s): Limited activity within patient's tolerance;Monitored during session;Repositioned;Premedicated before session    Home Living                      Prior Function            PT Goals (current goals can now be found in the care plan section) Acute Rehab PT Goals Patient Stated Goal: decrease pain and return home Progress towards PT goals: Progressing toward goals    Frequency    Min 5X/week      PT Plan Current plan remains appropriate    Co-evaluation              AM-PAC PT "6 Clicks" Mobility   Outcome Measure  Help needed turning from your back to your side while in a flat bed without using bedrails?: None Help needed moving  from lying on your back to sitting on the side of a flat bed without using bedrails?: None Help needed moving to and from a bed to a chair (including a wheelchair)?: A Little Help needed standing up from a chair using your arms (e.g., wheelchair or bedside chair)?: A Little Help needed to walk in hospital room?: A Little Help needed climbing 3-5 steps with a railing? : A Little 6 Click Score: 20    End of Session Equipment Utilized During Treatment: Back brace;Gait belt Activity Tolerance: Patient tolerated treatment well Patient left: in bed;with call bell/phone within reach;with family/visitor present;Other  (comment)(pt sitting EOB end of session to eat lunch; husband present) Nurse Communication: Mobility status PT Visit Diagnosis: Unsteadiness on feet (R26.81);Muscle weakness (generalized) (M62.81)     Time: 2263-3354 PT Time Calculation (min) (ACUTE ONLY): 11 min  Charges:  $Gait Training: 8-22 mins                     Earney Navy, PTA Acute Rehabilitation Services Pager: [phone] Office: [phone]     Darliss Cheney 03/16/2019, 4:22 PM

## 2019-03-16 NOTE — Progress Notes (Signed)
Pt without PIV at start of this shift. Waiting to assess pt for PIV or IV team assistance.

## 2019-03-16 NOTE — Evaluation (Signed)
Occupational Therapy Evaluation Patient Details Name: Kathy MediciMyra L Coleman MRN: [id number] DOB: September 04, 1955 Today's Date: 03/16/2019    History of Present Illness Pt is 63 yo female s/p thoracic twelve to lumbar one anterolateral decompression. PMH including  osteoarthritis, hypotension, back surgery (1998, 2010, 2018), neck surgery (2005, 2010), bilateral THA, and bilateral TKA.    Clinical Impression   Education provided on LB AE. Pt with good carry over skills s/p demo from OT. Pt performing all LB dressing with AE with set-upA and donning brace with modified independence. Education provided for toilet aid to assist with perineal care. Pt able to simulate walk in shower transfer with minguardA. Spouse present. Pt does not require f/up OT. OT following acutely.    Follow Up Recommendations  No OT follow up;Supervision - Intermittent    Equipment Recommendations  None recommended by OT    Recommendations for Other Services       Precautions / Restrictions Precautions Precautions: Back Precaution Comments: pt recalls 3/3 precautions  Required Braces or Orthoses: Spinal Brace Spinal Brace: Lumbar corset Restrictions Weight Bearing Restrictions: No      Mobility Bed Mobility Overal bed mobility: Needs Assistance Bed Mobility: Rolling;Sidelying to Sit;Sit to Sidelying Rolling: Supervision Sidelying to sit: Supervision     Sit to sidelying: Supervision General bed mobility comments: use of rail  Transfers Overall transfer level: Needs assistance Equipment used: Rolling walker (2 wheeled) Transfers: Sit to/from Stand Sit to Stand: Min guard         General transfer comment: Pt required min guard A for safety.    Balance Overall balance assessment: Needs assistance Sitting-balance support: Feet supported Sitting balance-Leahy Scale: Good Sitting balance - Comments: no assist for static sitting balance and dynamic sitting balance while scooting hips to EOB   Standing  balance support: Bilateral upper extremity supported Standing balance-Leahy Scale: Fair Standing balance comment: reliant on UE support using RW this session                           ADL either performed or assessed with clinical judgement   ADL Overall ADL's : Needs assistance/impaired Eating/Feeding: Supervision/ safety;Set up;Sitting                   Lower Body Dressing: Supervision/safety;Set up;With adaptive equipment;Cueing for safety;Cueing for sequencing;Sitting/lateral leans;Sit to/from stand Lower Body Dressing Details (indicate cue type and reason): Education provided on LB AE. Pt with good carry over skills s/p demo from OT. Pt performing all LB dressing with AE with set-upA and donning brace with modified independence. Toilet Transfer: Supervision/safety;Ambulation;Grab bars;RW;Comfort height toilet     Toileting - Clothing Manipulation Details (indicate cue type and reason): Education provided for toilet aid to assist with perineal care. Tub/ Shower Transfer: Min guard;Cueing for safety;Cueing for Print production plannersequencing Tub/Shower Transfer Details (indicate cue type and reason): stepping over small ledge for walk in shower simulation Functional mobility during ADLs: Supervision/safety;Rolling walker General ADL Comments: Pt set-upA to supervisionA for tasks with AE. Spouse present for session.     Vision   Vision Assessment?: No apparent visual deficits     Perception     Praxis      Pertinent Vitals/Pain Pain Assessment: Faces Faces Pain Scale: Hurts little more Pain Location: back Pain Descriptors / Indicators: Discomfort;Grimacing     Hand Dominance     Extremity/Trunk Assessment Upper Extremity Assessment Upper Extremity Assessment: Overall WFL for tasks assessed   Lower Extremity Assessment Lower Extremity  Assessment: Defer to PT evaluation;Generalized weakness       Communication     Cognition Arousal/Alertness: Awake/alert Behavior  During Therapy: WFL for tasks assessed/performed Overall Cognitive Status: Within Functional Limits for tasks assessed                                     General Comments  spouse in room    Exercises     Shoulder Instructions      Home Living                                          Prior Functioning/Environment                   OT Problem List:        OT Treatment/Interventions:      OT Goals(Current goals can be found in the care plan section) Acute Rehab OT Goals Patient Stated Goal: decrease pain and return home OT Goal Formulation: With patient Time For Goal Achievement: 03/29/19 Potential to Achieve Goals: Good ADL Goals Pt Will Perform Grooming: with modified independence;standing;with adaptive equipment Pt Will Perform Upper Body Dressing: with modified independence;sitting Pt Will Perform Lower Body Dressing: with adaptive equipment;with modified independence;sit to/from stand Pt Will Perform Tub/Shower Transfer: Shower transfer;with modified independence;ambulating;shower seat Additional ADL Goal #1: Pt will verbalize 3/3 back precautions with min VCs. Additional ADL Goal #2: Pt will complete bed mobility while maintaining back precautions with modified independence in preparation for ADLs  OT Frequency: Min 2X/week   Barriers to D/C:            Co-evaluation              AM-PAC OT "6 Clicks" Daily Activity     Outcome Measure Help from another person eating meals?: None Help from another person taking care of personal grooming?: None Help from another person toileting, which includes using toliet, bedpan, or urinal?: A Little Help from another person bathing (including washing, rinsing, drying)?: A Little Help from another person to put on and taking off regular upper body clothing?: None Help from another person to put on and taking off regular lower body clothing?: A Little 6 Click Score: 21   End of  Session Equipment Utilized During Treatment: Rolling walker Nurse Communication: Mobility status  Activity Tolerance: Patient tolerated treatment well Patient left: in bed;with call bell/phone within reach;with family/visitor present  OT Visit Diagnosis: Pain Pain - Right/Left: Left Pain - part of body: (back/hip)                Time: 1093-2355 OT Time Calculation (min): 15 min Charges:  OT General Charges $OT Visit: 1 Visit OT Treatments $Self Care/Home Management : 8-22 mins  Darryl Nestle) Marsa Aris OTR/L Acute Rehabilitation Services Pager: [phone] Office: [place] 03/16/2019, 2:31 PM

## 2019-03-16 NOTE — TOC Initial Note (Signed)
Transition of Care [facility]) - Initial/Assessment Note    Patient Details  Name: Kathy Coleman MRN: [id number] Date of Birth: Aug 16, 1955  Transition of Care [facility]) CM/SW Contact:    Pollie Friar, RN Phone Number: 03/16/2019, 4:05 PM  Clinical Narrative:                 Plan is for [facility] services when pt discharges home. CM provided the pt and spouse choice and they selected [facility]. [facility] will need orders once placed.  TOC following.  Expected Discharge Plan: [place] Barriers to Discharge: Continued Medical Work up   Patient Goals and CMS Choice   CMS Medicare.gov Compare Post Acute Care list provided to:: Patient Represenative (must comment) Choice offered to / list presented to : Spouse  Expected Discharge Plan and Services Expected Discharge Plan: [place]   Discharge Planning Services: CM Consult Post Acute Care Choice: [place] arrangements for the past 2 months: [place]: PT [place]: [facility]        Prior Living Arrangements/Services Living arrangements for the past 2 months: Single Family Home Lives with:: Spouse Patient language and need for interpreter reviewed:: Yes(no needs) Do you feel safe going back to the place where you live?: Yes      Need for Family Participation in Patient Care: Yes (Comment)(intermittent supervision) Care giver support system in place?: Yes (comment)(spouse) Current home services: DME(walker and 3 in 1) Criminal Activity/Legal Involvement Pertinent to Current Situation/Hospitalization: No - Comment as needed  Activities of Daily Living      Permission Sought/Granted                  Emotional Assessment Appearance:: Appears stated age Attitude/Demeanor/Rapport: Engaged Affect (typically observed): Accepting Orientation: : Oriented to Self, Oriented to Place, Oriented to  Time, Oriented  to Situation   Psych Involvement: No (comment)  Admission diagnosis:  DEGENERATION OF LUMBAR INTERVERTEBRAL [place] Patient Active Problem List   Diagnosis Date Noted  . Lumbar spondylosis with myelopathy 03/14/2019  . Primary osteoarthritis of left knee 06/21/2017  . Degenerative arthritis of left knee 06/16/2017  . Herniated nucleus pulposus, lumbar 10/05/2016  . Primary localized osteoarthritis of left hip 06/15/2016  . Primary osteoarthritis of left hip 06/12/2016  . Dysphagia 05/15/2016  . Arthritis of knee 06/24/2015  . Primary osteoarthritis of right knee 06/22/2015  . Primary osteoarthritis of right hip 05/11/2015  . Postoperative anemia due to acute blood loss 07/24/2014  . Lumbar radiculopathy 07/23/2014   PCP:  Celene Squibb, MD Pharmacy:   [place], [place] - [address] [place] [address] Phone: [phone] Fax: [phone] [facility], [place] [address] [place] [address] Phone: [phone] Fax: [phone]     Social Determinants of Health (SDOH) Interventions    Readmission Risk Interventions No flowsheet data found.

## 2019-03-16 NOTE — Plan of Care (Signed)
Patient request pain medication as needed and uses the call bell system.

## 2019-03-17 LAB — BPAM RBC
Blood Product Expiration Date: 202010102359
Blood Product Expiration Date: 202010132359
ISSUE DATE / TIME: 202009091135
ISSUE DATE / TIME: 202009102211
Unit Type and Rh: 5100
Unit Type and Rh: 5100

## 2019-03-17 LAB — TYPE AND SCREEN
ABO/RH(D): O POS
Antibody Screen: NEGATIVE
Unit division: 0
Unit division: 0

## 2019-03-17 MED ORDER — HYDROCODONE-ACETAMINOPHEN 5-325 MG PO TABS: 1.0000 | ORAL_TABLET | Freq: Four times a day (QID) | ORAL | 0 refills | Status: DC | PRN

## 2019-03-17 MED ORDER — PROMETHAZINE HCL 12.5 MG PO TABS: 12.5000 mg | ORAL_TABLET | Freq: Four times a day (QID) | ORAL | 5 refills | Status: DC | PRN

## 2019-03-17 MED ORDER — METHOCARBAMOL 500 MG PO TABS: 500.0000 mg | ORAL_TABLET | Freq: Four times a day (QID) | ORAL | 3 refills | Status: DC | PRN

## 2019-03-17 NOTE — Care Management Important Message (Signed)
Important Message  Patient Details  Name: Kathy Coleman MRN: [id number] Date of Birth: July 25, 1955   Medicare Important Message Given:  Yes     Orbie Pyo 03/17/2019, 2:27 PM

## 2019-03-17 NOTE — TOC Transition Note (Signed)
Transition of Care [facility]) - CM/SW Discharge Note   Patient Details  Name: Kathy Coleman MRN: [id number] Date of Birth: 1955-11-13  [facility] CM/SW Contact:  Maryclare Labrador, RN Phone Number: 03/17/2019, 12:25 PM   Clinical Narrative:    Pt to discharge home today.  [facility] chose [facility] for [facility] however CM was informed today that agency no longer provides [place].  Pt's second choice is [facility].  CM faxed requested documents to [facility] and pt has been accepted for HHPT.  Agency can not initiate services until 9/16 - both pt and attending in agreement with delayed start on HHPT.   NO other CM needs identified - CM signing off     Barriers to Discharge: Continued Medical Work up   Patient Goals and CMS Choice   CMS Medicare.gov Compare Post Acute Care list provided to:: Patient Represenative (must comment) Choice offered to / list presented to : Spouse  Discharge Placement                       Discharge Plan and Services   Discharge Planning Services: CM Consult Post Acute Care Choice: Home Health                    HH Arranged: PT [facility] Agency: [facility]        Social Determinants of Health (SDOH) Interventions     Readmission Risk Interventions No flowsheet data found.

## 2019-03-17 NOTE — Progress Notes (Signed)
Discharge instructions reviewed with patient including medication. IV removed without complication. Dressing to IV site cleaning dry and intact with no signs of bleeding. All personals items removed from the room.

## 2019-03-17 NOTE — Discharge Summary (Signed)
Physician Discharge Summary  Patient ID: Kathy Coleman MRN: [id number] DOB/AGE: 1956-02-04 63 y.o.  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  This required fixation and fusion from T10 to previous construct of L2 down to L5.  She tolerated this well however postoperatively she was noted to have signs of acute blood loss anemia with low blood pressure tachycardia and dizziness.  She received 2 units of red cells in transfusion and this has helped her symptoms considerably.  She is discharged home  Consults: None  Significant Diagnostic Studies: None  Treatments: surgery: Anterolateral decompression T12-L1 posterior fixation and fusion T10 to previous construct L2-L5.  Transfusion 2 units packed cells  Discharge Exam: Blood pressure 127/64, pulse 69, temperature 98.4 F (36.9 C), temperature source Oral, resp. rate 16, height 5\' 5"  (1.651 m), weight 77.1 kg, SpO2 99 %. Incision is clean and dry ambulation is intact  Disposition: Discharge disposition: 01-Home or Self Care       Discharge Instructions    Diet - low sodium heart healthy   Complete by: As directed    Incentive spirometry RT   Complete by: As directed    Increase activity slowly   Complete by: As directed      Allergies as of 03/17/2019      Reactions   Decadron [dexamethasone] Other (See Comments)   Severe burning in perineal area      Medication List    TAKE these medications   ADULT GUMMY PO Take 2 tablets by mouth daily.   atorvastatin 40 MG tablet Commonly known as: LIPITOR Take 40 mg by mouth at  bedtime.   celecoxib 200 MG capsule Commonly known as: CELEBREX Take 200 mg by mouth daily.   gabapentin 300 MG capsule Commonly known as: NEURONTIN Take 300 mg by mouth 3 (three) times daily.   HYDROcodone-acetaminophen 5-325 MG tablet Commonly known as: NORCO/VICODIN Take 1-2 tablets by mouth every 6 (six) hours as needed for moderate pain or severe pain. What changed:   how much to take  when to take this  reasons to take this   lisinopril-hydrochlorothiazide 20-25 MG tablet Commonly known as: ZESTORETIC Take 1 tablet by mouth daily.   methocarbamol 500 MG tablet Commonly known as: ROBAXIN Take 1 tablet (500 mg total) by mouth every 6 (six) hours as needed for muscle spasms.   omeprazole 20 MG capsule Commonly known as: PRILOSEC Take 20 mg by mouth daily as needed (acid reflux/indigestion.).   polyethylene glycol 17 g packet Commonly known as: MIRALAX / GLYCOLAX Take 17 g by mouth daily.   promethazine 12.5 MG tablet Commonly known as: PHENERGAN Take 1 tablet (12.5 mg total) by mouth every 6 (six) hours as needed for nausea or vomiting.   traMADol 50 MG tablet Commonly known as: ULTRAM Take 50 mg by mouth 2 (two) times daily.        Signed: Blanchie Dessert  03/17/2019, 8:09 AM

## 2019-08-08 ENCOUNTER — Telehealth: Payer: Self-pay

## 2019-08-08 ENCOUNTER — Encounter: Payer: Self-pay | Admitting: Internal Medicine

## 2019-08-08 ENCOUNTER — Other Ambulatory Visit: Payer: Self-pay

## 2019-08-08 ENCOUNTER — Ambulatory Visit: Payer: Medicare FFS | Admitting: Internal Medicine

## 2019-08-08 VITALS — BP 129/72 | HR 83 | Temp 97.1°F | Ht 66.0 in | Wt 165.2 lb

## 2019-08-08 DIAGNOSIS — K5903 Drug induced constipation: Secondary | ICD-10-CM

## 2019-08-08 DIAGNOSIS — R1319 Other dysphagia: Secondary | ICD-10-CM

## 2019-08-08 DIAGNOSIS — R1310 Dysphagia, unspecified: Secondary | ICD-10-CM

## 2019-08-08 NOTE — Telephone Encounter (Signed)
Called and informed pt of pre-op and COVID test appt 08/14/19.

## 2019-08-08 NOTE — Patient Instructions (Signed)
Schedule an EGD with dilation - dysphagia - propofol  Continue Miralax daily  Further recommendations to follow

## 2019-08-08 NOTE — Progress Notes (Signed)
Primary Care Physician:  Benita Stabile, MD Primary Gastroenterologist:  Dr. Jena Gauss  Pre-Procedure History & Physical: HPI:  Kathy Coleman is a 64 y.o. female here for further evaluation of recurrent esophageal dysphagia.  Saw this nice lady back in 2017 for esophageal dysphagia.  EGD demonstrated a normal esophagus; 54 French Maloney dilator passed.  Esophageal biopsies negative for EOE.  Son has EOE.  Dysphagia improved until the last several months when it has insidiously recurred.  Endorses reflux symptoms only about once a month for which she takes omeprazole on demand.  No abdominal pain, nausea, vomiting, melena or diarrhea.  Chronic constipation well managed with MiraLAX.  Colonoscopy 2017 diverticulosis; due for screening exam 6 years.  No alcohol or tobacco. Patient has had multiple lumbar spine procedures and hip arthroplasty since she was last seen here. Patient notes a small lump from time to time with drainage corresponding to laparoscopy site just above the umbilicus.  Prior cholecystectomy   Seeing Dr. Margo Aye later today.  Past Medical History:  Diagnosis Date  . Arthritis   . Constipation    takes Miralax nightly  . GERD (gastroesophageal reflux disease)    takes Zantac daily  . History of blood transfusion   . History of kidney stones   . Hyperlipidemia    takes Atorvastatin daily  . Hypertension    takes Lisinopril daily  . Osteoarthritis    left hip  . Osteoarthritis of left knee   . PONV (postoperative nausea and vomiting)    in the past did not have with last surgery in december; did well w/scopalamine patch    Past Surgical History:  Procedure Laterality Date  . ABDOMINAL HYSTERECTOMY    . ANTERIOR LAT LUMBAR FUSION N/A 03/14/2019   Procedure: THORACIC TWELVE TO LUMBAR ONE ANTEROLATERAL DECOMPRESSION;  Surgeon: Barnett Abu, MD;  Location: [facility] OR;  Service: Neurosurgery;  Laterality: N/A;  . APPLICATION OF ROBOTIC ASSISTANCE FOR SPINAL PROCEDURE N/A 03/14/2019    Procedure: APPLICATION OF ROBOTIC ASSISTANCE FOR SPINAL PROCEDURE;  Surgeon: Barnett Abu, MD;  Location: [facility] OR;  Service: Neurosurgery;  Laterality: N/A;  . BACK SURGERY     1998  , 2010  17 LOW BACK    . BIOPSY  02/13/2016   Procedure: BIOPSY;  Surgeon: Corbin Ade, MD;  Location: [facility];  Service: Endoscopy;;  Gastric and Esophageal biopsies  . CHOLECYSTECTOMY    . COLONOSCOPY  03/24/2006   Dr.- a couple of anal papillae, o/w normal rectum with L sided diverticula, the remainder of the colonic mucosa appeared normal.   . COLONOSCOPY N/A 02/13/2016   Dr. Jena Gauss: diverticulosis, melanosis coli   . ESOPHAGOGASTRODUODENOSCOPY  03/24/2006   Dr.- normal esophagus. a couple of antral erosions, o/w normal stomach, patent pylorus, normal D1,D2  . ESOPHAGOGASTRODUODENOSCOPY N/A 02/13/2016   Dr. Jena Gauss: erythematous mucosa, normal D2, empiric dilation   . FOOT SURGERY     RIGHT  2014  . LUMBAR PERCUTANEOUS PEDICLE SCREW 1 LEVEL N/A 03/14/2019   Procedure: GEXBM GUIDED PEDICLE SCREWS THORACIC TEN TO Lumbar THREE;  Surgeon: Barnett Abu, MD;  Location: [facility] OR;  Service: Neurosurgery;  Laterality: N/A;  . Elease Hashimoto DILATION N/A 02/13/2016   Procedure: Elease Hashimoto DILATION;  Surgeon: Corbin Ade, MD;  Location: [facility];  Service: Endoscopy;  Laterality: N/A;  . NECK SURGERY     2005, 2010  . TOTAL HIP ARTHROPLASTY Right 05/13/2015   Procedure: TOTAL HIP ARTHROPLASTY ANTERIOR APPROACH;  Surgeon:  Gean Birchwood, MD;  Location: [facility] OR;  Service: Orthopedics;  Laterality: Right;  . TOTAL HIP ARTHROPLASTY Left 06/15/2016   Procedure: TOTAL HIP ARTHROPLASTY ANTERIOR APPROACH;  Surgeon: Gean Birchwood, MD;  Location: [facility] OR;  Service: Orthopedics;  Laterality: Left;  . TOTAL KNEE ARTHROPLASTY Right 06/24/2015   Procedure: TOTAL KNEE ARTHROPLASTY;  Surgeon: Gean Birchwood, MD;  Location: [facility] OR;  Service: Orthopedics;  Laterality: Right;  . TOTAL KNEE ARTHROPLASTY Left 06/21/2017   Procedure: LEFT TOTAL  KNEE ARTHROPLASTY;  Surgeon: Gean Birchwood, MD;  Location: [facility] OR;  Service: Orthopedics;  Laterality: Left;    Prior to Admission medications   Medication Sig Start Date End Date Taking? Authorizing Provider  atorvastatin (LIPITOR) 40 MG tablet Take 40 mg by mouth at bedtime.   Yes [provider]  celecoxib (CELEBREX) 200 MG capsule Take 200 mg by mouth daily.   Yes [provider]  cephALEXin (KEFLEX) 500 MG capsule Take 500 mg by mouth 4 (four) times daily. Takes twice a day for one week.   Yes [provider]  gabapentin (NEURONTIN) 300 MG capsule Take 300 mg by mouth 3 (three) times daily.   Yes [provider]  HYDROcodone-acetaminophen (NORCO/VICODIN) 5-325 MG tablet Take 1-2 tablets by mouth every 6 (six) hours as needed for moderate pain or severe pain. Patient taking differently: Take 1-2 tablets by mouth at bedtime.  03/17/19  Yes Barnett Abu, MD  lisinopril-hydrochlorothiazide (ZESTORETIC) 20-25 MG tablet Take 1 tablet by mouth daily.   Yes [provider]  Multiple Vitamins-Minerals (ADULT GUMMY PO) Take 2 tablets by mouth daily.   Yes [provider]  omeprazole (PRILOSEC) 20 MG capsule Take 20 mg by mouth as needed (acid reflux/indigestion.).    Yes [provider]  polyethylene glycol (MIRALAX / GLYCOLAX) packet Take 17 g by mouth daily.    Yes [provider]  traMADol (ULTRAM) 50 MG tablet Take 50 mg by mouth 2 (two) times daily.    Yes [provider]  methocarbamol (ROBAXIN) 500 MG tablet Take 1 tablet (500 mg total) by mouth every 6 (six) hours as needed for muscle spasms. Patient not taking: Reported on 08/08/2019 03/17/19   Barnett Abu, MD  promethazine (PHENERGAN) 12.5 MG tablet Take 1 tablet (12.5 mg total) by mouth every 6 (six) hours as needed for nausea or vomiting. Patient not taking: Reported on 08/08/2019 03/17/19   Barnett Abu, MD    Allergies as of 08/08/2019 - Review Complete  08/08/2019  Allergen Reaction Noted  . Decadron [dexamethasone] Other (See Comments) 06/16/2016    Family History  Problem Relation Age of Onset  . Hypertension Mother   . COPD Mother   . Diabetes Mother   . Heart disease Father   . Diabetes Father   . Hypertension Father     Social History   Socioeconomic History  . Marital status: Married    Spouse name: Not on file  . Number of children: Not on file  . Years of education: Not on file  . Highest education level: Not on file  Occupational History  . Not on file  Tobacco Use  . Smoking status: Former Smoker    Quit date: 06/18/1981    Years since quitting: 38.1  . Smokeless tobacco: Never Used  . Tobacco comment: Quit x 30 years  Substance and Sexual Activity  . Alcohol use: No    Alcohol/week: 0.0 standard drinks  . Drug use: No  . Sexual activity: Not on  file  Other Topics Concern  . Not on file  Social History Narrative  . Not on file   Social Determinants of Health   Financial Resource Strain:   . Difficulty of Paying Living Expenses: Not on file  Food Insecurity:   . Worried About Charity fundraiser in the Last Year: Not on file  . Ran Out of Food in the Last Year: Not on file  Transportation Needs:   . Lack of Transportation (Medical): Not on file  . Lack of Transportation (Non-Medical): Not on file  Physical Activity:   . Days of Exercise per Week: Not on file  . Minutes of Exercise per Session: Not on file  Stress:   . Feeling of Stress : Not on file  Social Connections:   . Frequency of Communication with Friends and Family: Not on file  . Frequency of Social Gatherings with Friends and Family: Not on file  . Attends Religious Services: Not on file  . Active Member of Clubs or Organizations: Not on file  . Attends Archivist Meetings: Not on file  . Marital Status: Not on file  Intimate Partner Violence:   . Fear of Current or Ex-Partner: Not on file  . Emotionally Abused: Not on  file  . Physically Abused: Not on file  . Sexually Abused: Not on file    Review of Systems: See HPI, otherwise negative ROS  Physical Exam: BP 129/72   Pulse 83   Temp (!) 97.1 F (36.2 C) (Temporal)   Ht 5\' 6"  (1.676 m)   Wt 165 lb 3.2 oz (74.9 kg)   BMI 26.66 kg/m  General:   Alert,  Well-developed, well-nourished, pleasant and cooperative in NAD Skin:  Intact without significant lesions or rashes. al adenopathy. Lungs:  Clear throughout to auscultation.   No wheezes, crackles, or rhonchi. No acute distress. Heart:  Regular rate and rhythm; no murmurs, clicks, rubs,  or gallops. Abdomen: Non-distended, normal bowel sounds.  Soft and nontender without appreciable mass or hepatosplenomegaly.  Mildly tender above the umbilicus.  Possible area of drainage noted.  Nothing expressible.  No palpable mass. Pulses:  Normal pulses noted. Extremities:  Without clubbing or edema.  Impression/Plan: 64 year old lady with recurrent esophageal dysphagia.  Normal esophagus endoscopically and histologically 2017 -  responded to empiric dilation.  Family history of EOE.  Now with recurrent symptoms.  May have had an occult ring or web previously.  Cannot exclude EOE in evolution.  I doubt neoplasm. Small draining area intermittently above umbilicus likely representing a cyst.  Constipation managed well with MiraLAX.  Recommendations: I have offered the patient an EGD with esophageal dilation as feasible/appropriate.  The risks, benefits, limitations, alternatives and imponderables have been reviewed with the patient. Potential for esophageal dilation, biopsy, etc. have also been reviewed.  Questions have been answered. All parties agreeable.  See Dr. Nevada Crane as planned for drainage issue.  Plan for average rescreening colonoscopy 2027.  Further recommendations to follow.         Notice: This dictation was prepared with Dragon dictation along with smaller phrase technology. Any  transcriptional errors that result from this process are unintentional and may not be corrected upon review.

## 2019-08-08 NOTE — H&P (View-Only) (Signed)
Primary Care Physician:  Benita Stabile, MD Primary Gastroenterologist:  Dr. Jena Gauss  Pre-Procedure History & Physical: HPI:  Kathy Coleman is a 64 y.o. female here for further evaluation of recurrent esophageal dysphagia.  Saw this nice lady back in 2017 for esophageal dysphagia.  EGD demonstrated a normal esophagus; 54 French Maloney dilator passed.  Esophageal biopsies negative for EOE.  Son has EOE.  Dysphagia improved until the last several months when it has insidiously recurred.  Endorses reflux symptoms only about once a month for which she takes omeprazole on demand.  No abdominal pain, nausea, vomiting, melena or diarrhea.  Chronic constipation well managed with MiraLAX.  Colonoscopy 2017 diverticulosis; due for screening exam 6 years.  No alcohol or tobacco. Patient has had multiple lumbar spine procedures and hip arthroplasty since she was last seen here. Patient notes a small lump from time to time with drainage corresponding to laparoscopy site just above the umbilicus.  Prior cholecystectomy   Seeing Dr. Margo Aye later today.  Past Medical History:  Diagnosis Date  . Arthritis   . Constipation    takes Miralax nightly  . GERD (gastroesophageal reflux disease)    takes Zantac daily  . History of blood transfusion   . History of kidney stones   . Hyperlipidemia    takes Atorvastatin daily  . Hypertension    takes Lisinopril daily  . Osteoarthritis    left hip  . Osteoarthritis of left knee   . PONV (postoperative nausea and vomiting)    in the past did not have with last surgery in december; did well w/scopalamine patch    Past Surgical History:  Procedure Laterality Date  . ABDOMINAL HYSTERECTOMY    . ANTERIOR LAT LUMBAR FUSION N/A 03/14/2019   Procedure: THORACIC TWELVE TO LUMBAR ONE ANTEROLATERAL DECOMPRESSION;  Surgeon: Barnett Abu, MD;  Location: [facility] OR;  Service: Neurosurgery;  Laterality: N/A;  . APPLICATION OF ROBOTIC ASSISTANCE FOR SPINAL PROCEDURE N/A 03/14/2019    Procedure: APPLICATION OF ROBOTIC ASSISTANCE FOR SPINAL PROCEDURE;  Surgeon: Barnett Abu, MD;  Location: [facility] OR;  Service: Neurosurgery;  Laterality: N/A;  . BACK SURGERY     1998  , 2010  17 LOW BACK    . BIOPSY  02/13/2016   Procedure: BIOPSY;  Surgeon: Corbin Ade, MD;  [facility]: [facility];  Service: Endoscopy;;  Gastric and Esophageal biopsies  . CHOLECYSTECTOMY    . COLONOSCOPY  03/24/2006   Dr.- a couple of anal papillae, o/w normal rectum with L sided diverticula, the remainder of the colonic mucosa appeared normal.   . COLONOSCOPY N/A 02/13/2016   Dr. Jena Gauss: diverticulosis, melanosis coli   . ESOPHAGOGASTRODUODENOSCOPY  03/24/2006   Dr.- normal esophagus. a couple of antral erosions, o/w normal stomach, patent pylorus, normal D1,D2  . ESOPHAGOGASTRODUODENOSCOPY N/A 02/13/2016   Dr. Jena Gauss: erythematous mucosa, normal D2, empiric dilation   . FOOT SURGERY     RIGHT  2014  . LUMBAR PERCUTANEOUS PEDICLE SCREW 1 LEVEL N/A 03/14/2019   Procedure: GEXBM GUIDED PEDICLE SCREWS THORACIC TEN TO Lumbar THREE;  Surgeon: Barnett Abu, MD;  Location: [facility] OR;  Service: Neurosurgery;  Laterality: N/A;  . Elease Hashimoto DILATION N/A 02/13/2016   Procedure: Elease Hashimoto DILATION;  Surgeon: Corbin Ade, MD;  [facility]: [facility];  Service: Endoscopy;  Laterality: N/A;  . NECK SURGERY     2005, 2010  . TOTAL HIP ARTHROPLASTY Right 05/13/2015   Procedure: TOTAL HIP ARTHROPLASTY ANTERIOR APPROACH;  Surgeon:  Frank Rowan, MD;  Location: [facility] OR;  Service: Orthopedics;  Laterality: Right;  . TOTAL HIP ARTHROPLASTY Left 06/15/2016   Procedure: TOTAL HIP ARTHROPLASTY ANTERIOR APPROACH;  Surgeon: Frank Rowan, MD;  Location: [facility] OR;  Service: Orthopedics;  Laterality: Left;  . TOTAL KNEE ARTHROPLASTY Right 06/24/2015   Procedure: TOTAL KNEE ARTHROPLASTY;  Surgeon: Frank Rowan, MD;  Location: [facility] OR;  Service: Orthopedics;  Laterality: Right;  . TOTAL KNEE ARTHROPLASTY Left 06/21/2017   Procedure: LEFT TOTAL  KNEE ARTHROPLASTY;  Surgeon: Rowan, Frank, MD;  Location: [facility] OR;  Service: Orthopedics;  Laterality: Left;    Prior to Admission medications   Medication Sig Start Date End Date Taking? Authorizing Provider  atorvastatin (LIPITOR) 40 MG tablet Take 40 mg by mouth at bedtime.   Yes [provider]  celecoxib (CELEBREX) 200 MG capsule Take 200 mg by mouth daily.   Yes [provider]  cephALEXin (KEFLEX) 500 MG capsule Take 500 mg by mouth 4 (four) times daily. Takes twice a day for one week.   Yes [provider]  gabapentin (NEURONTIN) 300 MG capsule Take 300 mg by mouth 3 (three) times daily.   Yes [provider]  HYDROcodone-acetaminophen (NORCO/VICODIN) 5-325 MG tablet Take 1-2 tablets by mouth every 6 (six) hours as needed for moderate pain or severe pain. Patient taking differently: Take 1-2 tablets by mouth at bedtime.  03/17/19  Yes Elsner, Henry, MD  lisinopril-hydrochlorothiazide (ZESTORETIC) 20-25 MG tablet Take 1 tablet by mouth daily.   Yes [provider]  Multiple Vitamins-Minerals (ADULT GUMMY PO) Take 2 tablets by mouth daily.   Yes [provider]  omeprazole (PRILOSEC) 20 MG capsule Take 20 mg by mouth as needed (acid reflux/indigestion.).    Yes [provider]  polyethylene glycol (MIRALAX / GLYCOLAX) packet Take 17 g by mouth daily.    Yes [provider]  traMADol (ULTRAM) 50 MG tablet Take 50 mg by mouth 2 (two) times daily.    Yes [provider]  methocarbamol (ROBAXIN) 500 MG tablet Take 1 tablet (500 mg total) by mouth every 6 (six) hours as needed for muscle spasms. Patient not taking: Reported on 08/08/2019 03/17/19   Elsner, Henry, MD  promethazine (PHENERGAN) 12.5 MG tablet Take 1 tablet (12.5 mg total) by mouth every 6 (six) hours as needed for nausea or vomiting. Patient not taking: Reported on 08/08/2019 03/17/19   Elsner, Henry, MD    Allergies as of 08/08/2019 - Review Complete  08/08/2019  Allergen Reaction Noted  . Decadron [dexamethasone] Other (See Comments) 06/16/2016    Family History  Problem Relation Age of Onset  . Hypertension Mother   . COPD Mother   . Diabetes Mother   . Heart disease Father   . Diabetes Father   . Hypertension Father     Social History   Socioeconomic History  . Marital status: Married    Spouse name: Not on file  . Number of children: Not on file  . Years of education: Not on file  . Highest education level: Not on file  Occupational History  . Not on file  Tobacco Use  . Smoking status: Former Smoker    Quit date: 06/18/1981    Years since quitting: 38.1  . Smokeless tobacco: Never Used  . Tobacco comment: Quit x 30 years  Substance and Sexual Activity  . Alcohol use: No    Alcohol/week: 0.0 standard drinks  . Drug use: No  . Sexual activity: Not on   file  Other Topics Concern  . Not on file  Social History Narrative  . Not on file   Social Determinants of Health   Financial Resource Strain:   . Difficulty of Paying Living Expenses: Not on file  Food Insecurity:   . Worried About Charity fundraiser in the Last Year: Not on file  . Ran Out of Food in the Last Year: Not on file  Transportation Needs:   . Lack of Transportation (Medical): Not on file  . Lack of Transportation (Non-Medical): Not on file  Physical Activity:   . Days of Exercise per Week: Not on file  . Minutes of Exercise per Session: Not on file  Stress:   . Feeling of Stress : Not on file  Social Connections:   . Frequency of Communication with Friends and Family: Not on file  . Frequency of Social Gatherings with Friends and Family: Not on file  . Attends Religious Services: Not on file  . Active Member of Clubs or Organizations: Not on file  . Attends Archivist Meetings: Not on file  . Marital Status: Not on file  Intimate Partner Violence:   . Fear of Current or Ex-Partner: Not on file  . Emotionally Abused: Not on  file  . Physically Abused: Not on file  . Sexually Abused: Not on file    Review of Systems: See HPI, otherwise negative ROS  Physical Exam: BP 129/72   Pulse 83   Temp (!) 97.1 F (36.2 C) (Temporal)   Ht 5\' 6"  (1.676 m)   Wt 165 lb 3.2 oz (74.9 kg)   BMI 26.66 kg/m  General:   Alert,  Well-developed, well-nourished, pleasant and cooperative in NAD Skin:  Intact without significant lesions or rashes. al adenopathy. Lungs:  Clear throughout to auscultation.   No wheezes, crackles, or rhonchi. No acute distress. Heart:  Regular rate and rhythm; no murmurs, clicks, rubs,  or gallops. Abdomen: Non-distended, normal bowel sounds.  Soft and nontender without appreciable mass or hepatosplenomegaly.  Mildly tender above the umbilicus.  Possible area of drainage noted.  Nothing expressible.  No palpable mass. Pulses:  Normal pulses noted. Extremities:  Without clubbing or edema.  Impression/Plan: 64 year old lady with recurrent esophageal dysphagia.  Normal esophagus endoscopically and histologically 2017 -  responded to empiric dilation.  Family history of EOE.  Now with recurrent symptoms.  May have had an occult ring or web previously.  Cannot exclude EOE in evolution.  I doubt neoplasm. Small draining area intermittently above umbilicus likely representing a cyst.  Constipation managed well with MiraLAX.  Recommendations: I have offered the patient an EGD with esophageal dilation as feasible/appropriate.  The risks, benefits, limitations, alternatives and imponderables have been reviewed with the patient. Potential for esophageal dilation, biopsy, etc. have also been reviewed.  Questions have been answered. All parties agreeable.  See Dr. [place] Crane as planned for drainage issue.  Plan for average rescreening colonoscopy 2027.  Further recommendations to follow.         Notice: This dictation was prepared with Dragon dictation along with smaller phrase technology. Any  transcriptional errors that result from this process are unintentional and may not be corrected upon review.

## 2019-08-08 NOTE — Patient Instructions (Signed)
PA for EGD submitted via [employer] website. Case approved. [employer]# [id number], valid 08/17/19-09/16/19.

## 2019-08-10 NOTE — Patient Instructions (Signed)
MONTASIA CHISENHALL  08/10/2019     @PREFPERIOPPHARMACY @   Your procedure is scheduled on  08/17/2019   Report to [facility] at   0745  A.M.  Call this number if you have problems the morning of surgery:  [phone]   Remember:  Follow the diet instructions given to you by Dr [phone] office.                    Take these medicines the morning of surgery with A SIP OF WATER  Celebrex, gabapentin, prilosec,lisinopril, tramadol.    Do not wear jewelry, make-up or nail polish.  Do not wear lotions, powders, or perfumes.Please wear deodorant and brush your teeth.  Do not shave 48 hours prior to surgery.  Men may shave face and neck.  Do not bring valuables to the hospital.  [facility] is not responsible for any belongings or valuables.  Contacts, dentures or bridgework may not be worn into surgery.  Leave your suitcase in the car.  After surgery it may be brought to your room.  For patients admitted to the hospital, discharge time will be determined by your treatment team.  Patients discharged the day of surgery will not be allowed to drive home.   Name and phone number of your driver:   family Special instructions:  None  Please read over the following fact sheets that you were given. Anesthesia Post-op Instructions and Care and Recovery After Surgery       Upper Endoscopy, Adult, Care After This sheet gives you information about how to care for yourself after your procedure. Your health care provider may also give you more specific instructions. If you have problems or questions, contact your health care provider. What can I expect after the procedure? After the procedure, it is common to have:  A sore throat.  Mild stomach pain or discomfort.  Bloating.  Nausea. Follow these instructions at home:   Follow instructions from your health care provider about what to eat or drink after your procedure.  Return to your normal activities as told by your health  care provider. Ask your health care provider what activities are safe for you.  Take over-the-counter and prescription medicines only as told by your health care provider.  Do not drive for 24 hours if you were given a sedative during your procedure.  Keep all follow-up visits as told by your health care provider. This is important. Contact a health care provider if you have:  A sore throat that lasts longer than one day.  Trouble swallowing. Get help right away if:  You vomit blood or your vomit looks like coffee grounds.  You have: ? A fever. ? Bloody, black, or tarry stools. ? A severe sore throat or you cannot swallow. ? Difficulty breathing. ? Severe pain in your chest or abdomen. Summary  After the procedure, it is common to have a sore throat, mild stomach discomfort, bloating, and nausea.  Do not drive for 24 hours if you were given a sedative during the procedure.  Follow instructions from your health care provider about what to eat or drink after your procedure.  Return to your normal activities as told by your health care provider. This information is not intended to replace advice given to you by your health care provider. Make sure you discuss any questions you have with your health care provider. Document Revised: 12/14/2017 Document Reviewed: 11/22/2017 Elsevier Patient  Education  [employer].  Esophageal Dilatation Esophageal dilatation, also called esophageal dilation, is a procedure to widen or open (dilate) a blocked or narrowed part of the esophagus. The esophagus is the part of the body that moves food and liquid from the mouth to the stomach. You Willert need this procedure if:  You have a buildup of scar tissue in your esophagus that makes it difficult, painful, or impossible to swallow. This can be caused by gastroesophageal reflux disease (GERD).  You have cancer of the esophagus.  There is a problem with how food moves through your esophagus. In  some cases, you Bixler need this procedure repeated at a later time to dilate the esophagus gradually. Tell a health care provider about:  Any allergies you have.  All medicines you are taking, including vitamins, herbs, eye drops, creams, and over-the-counter medicines.  Any problems you or family members have had with anesthetic medicines.  Any blood disorders you have.  Any surgeries you have had.  Any medical conditions you have.  Any antibiotic medicines you are required to take before dental procedures.  Whether you are pregnant or Laflam be pregnant. What are the risks? Generally, this is a safe procedure. However, problems Snooks occur, including:  Bleeding due to a tear in the lining of the esophagus.  A hole (perforation) in the esophagus. What happens before the procedure?  Follow instructions from your health care provider about eating or drinking restrictions.  Ask your health care provider about changing or stopping your regular medicines. This is especially important if you are taking diabetes medicines or blood thinners.  Plan to have someone take you home from the hospital or clinic.  Plan to have a responsible adult care for you for at least 24 hours after you leave the hospital or clinic. This is important. What happens during the procedure?  You Branton be given a medicine to help you relax (sedative).  A numbing medicine Mortellaro be sprayed into the back of your throat, or you Dettmer gargle the medicine.  Your health care provider Hitchner perform the dilatation using various surgical instruments, such as: ? Simple dilators. This instrument is carefully placed in the esophagus to stretch it. ? Guided wire bougies. This involves using an endoscope to insert a wire into the esophagus. A dilator is passed over this wire to enlarge the esophagus. Then the wire is removed. ? Balloon dilators. An endoscope with a small balloon at the end is inserted into the esophagus. The balloon is  inflated to stretch the esophagus and open it up. The procedure Valls vary among health care providers and hospitals. What happens after the procedure?  Your blood pressure, heart rate, breathing rate, and blood oxygen level will be monitored until the medicines you were given have worn off.  Your throat Sproull feel slightly sore and numb. This will improve slowly over time.  You will not be allowed to eat or drink until your throat is no longer numb.  When you are able to drink, urinate, and sit on the edge of the bed without nausea or dizziness, you Capers be able to return home. Follow these instructions at home:  Take over-the-counter and prescription medicines only as told by your health care provider.  Do not drive for 24 hours if you were given a sedative during your procedure.  You should have a responsible adult with you for 24 hours after the procedure.  Follow instructions from your health care provider about any  eating or drinking restrictions.  Do not use any products that contain nicotine or tobacco, such as cigarettes and e-cigarettes. If you need help quitting, ask your health care provider.  Keep all follow-up visits as told by your health care provider. This is important. Get help right away if you:  Have a fever.  Have chest pain.  Have pain that is not relieved by medication.  Have trouble breathing.  Have trouble swallowing.  Vomit blood. Summary  Esophageal dilatation, also called esophageal dilation, is a procedure to widen or open (dilate) a blocked or narrowed part of the esophagus.  Plan to have someone take you home from the hospital or clinic.  For this procedure, a numbing medicine Mitten be sprayed into the back of your throat, or you Brownrigg gargle the medicine.  Do not drive for 24 hours if you were given a sedative during your procedure. This information is not intended to replace advice given to you by your health care provider. Make sure you discuss  any questions you have with your health care provider. Document Revised: 04/19/2019 Document Reviewed: 04/27/2017 Elsevier Patient Education  2020 [place] After These instructions provide you with information about caring for yourself after your procedure. Your health care provider Frieden also give you more specific instructions. Your treatment has been planned according to current medical practices, but problems sometimes occur. Call your health care provider if you have any problems or questions after your procedure. What can I expect after the procedure? After your procedure, you Fullwood:  Feel sleepy for several hours.  Feel clumsy and have poor balance for several hours.  Feel forgetful about what happened after the procedure.  Have poor judgment for several hours.  Feel nauseous or vomit.  Have a sore throat if you had a breathing tube during the procedure. Follow these instructions at home: For at least 24 hours after the procedure:      Have a responsible adult stay with you. It is important to have someone help care for you until you are awake and alert.  Rest as needed.  Do not: ? Participate in activities in which you could fall or become injured. ? Drive. ? Use heavy machinery. ? Drink alcohol. ? Take sleeping pills or medicines that cause drowsiness. ? Make important decisions or sign legal documents. ? Take care of children on your own. Eating and drinking  Follow the diet that is recommended by your health care provider.  If you vomit, drink water, juice, or soup when you can drink without vomiting.  Make sure you have little or no nausea before eating solid foods. General instructions  Take over-the-counter and prescription medicines only as told by your health care provider.  If you have sleep apnea, surgery and certain medicines can increase your risk for breathing problems. Follow instructions from your health care  provider about wearing your sleep device: ? Anytime you are sleeping, including during daytime naps. ? While taking prescription pain medicines, sleeping medicines, or medicines that make you drowsy.  If you smoke, do not smoke without supervision.  Keep all follow-up visits as told by your health care provider. This is important. Contact a health care provider if:  You keep feeling nauseous or you keep vomiting.  You feel light-headed.  You develop a rash.  You have a fever. Get help right away if:  You have trouble breathing. Summary  For several hours after your procedure, you Tosh feel sleepy and  have poor judgment.  Have a responsible adult stay with you for at least 24 hours or until you are awake and alert. This information is not intended to replace advice given to you by your health care provider. Make sure you discuss any questions you have with your health care provider. Document Revised: 09/20/2017 Document Reviewed: 10/13/2015 Elsevier Patient Education  [place].

## 2019-08-14 ENCOUNTER — Other Ambulatory Visit: Payer: Self-pay

## 2019-08-14 ENCOUNTER — Encounter (HOSPITAL_COMMUNITY)
Admission: RE | Admit: 2019-08-14 | Discharge: 2019-08-14 | Disposition: A | Discharge status: Home / Self Care | Payer: Medicare FFS | Source: Ambulatory Visit | Attending: Internal Medicine | Admitting: Internal Medicine

## 2019-08-14 ENCOUNTER — Other Ambulatory Visit (HOSPITAL_COMMUNITY)
Admission: RE | Admit: 2019-08-14 | Discharge: 2019-08-14 | Disposition: A | Discharge status: Home / Self Care | Payer: Medicare FFS | Source: Ambulatory Visit | Attending: Internal Medicine | Admitting: Internal Medicine

## 2019-08-14 DIAGNOSIS — Z87891 Personal history of nicotine dependence: Secondary | ICD-10-CM | POA: Diagnosis not present

## 2019-08-14 DIAGNOSIS — K219 Gastro-esophageal reflux disease without esophagitis: Secondary | ICD-10-CM | POA: Diagnosis not present

## 2019-08-14 DIAGNOSIS — Z79899 Other long term (current) drug therapy: Secondary | ICD-10-CM | POA: Insufficient documentation

## 2019-08-14 DIAGNOSIS — I10 Essential (primary) hypertension: Secondary | ICD-10-CM | POA: Diagnosis not present

## 2019-08-14 DIAGNOSIS — E785 Hyperlipidemia, unspecified: Secondary | ICD-10-CM | POA: Diagnosis not present

## 2019-08-14 DIAGNOSIS — R1310 Dysphagia, unspecified: Secondary | ICD-10-CM | POA: Insufficient documentation

## 2019-08-14 DIAGNOSIS — Z01812 Encounter for preprocedural laboratory examination: Secondary | ICD-10-CM | POA: Insufficient documentation

## 2019-08-14 LAB — BASIC METABOLIC PANEL
Anion gap: 7 (ref 5–15)
BUN: 18 mg/dL (ref 8–23)
CO2: 24 mmol/L (ref 22–32)
Calcium: 9.1 mg/dL (ref 8.9–10.3)
Chloride: 108 mmol/L (ref 98–111)
Creatinine, Ser: 0.97 mg/dL (ref 0.44–1.00)
GFR calc Af Amer: >60 mL/min (ref >60)
GFR calc non Af Amer: >60 mL/min (ref >60)
Glucose, Bld: 90 mg/dL (ref 70–99)
Potassium: 3.8 mmol/L (ref 3.5–5.1)
Sodium: 139 mmol/L (ref 135–145)

## 2019-08-14 LAB — CBC WITH DIFFERENTIAL/PLATELET
Abs Immature Granulocytes: 0.01 10*3/uL (ref 0.00–0.07)
Basophils Absolute: 0 10*3/uL (ref 0.0–0.1)
Basophils Relative: 0 %
Eosinophils Absolute: 0.1 10*3/uL (ref 0.0–0.5)
Eosinophils Relative: 2 %
HCT: 33.3 % — ABNORMAL LOW (ref 36.0–46.0)
Hemoglobin: 10.7 g/dL — ABNORMAL LOW (ref 12.0–15.0)
Immature Granulocytes: 0 %
Lymphocytes Relative: 23 %
Lymphs Abs: 1 10*3/uL (ref 0.7–4.0)
MCH: 32.4 pg (ref 26.0–34.0)
MCHC: 32.1 g/dL (ref 30.0–36.0)
MCV: 100.9 fL — ABNORMAL HIGH (ref 80.0–100.0)
Monocytes Absolute: 0.4 10*3/uL (ref 0.1–1.0)
Monocytes Relative: 9 %
Neutro Abs: 2.9 10*3/uL (ref 1.7–7.7)
Neutrophils Relative %: 66 %
Platelets: 222 10*3/uL (ref 150–400)
RBC: 3.3 MIL/uL — ABNORMAL LOW (ref 3.87–5.11)
RDW: 12.5 % (ref 11.5–15.5)
WBC: 4.4 10*3/uL (ref 4.0–10.5)
nRBC: 0 % (ref 0.0–0.2)

## 2019-08-14 LAB — SARS CORONAVIRUS 2 (TAT 6-24 HRS): SARS Coronavirus 2: NEGATIVE

## 2019-08-17 ENCOUNTER — Ambulatory Visit (HOSPITAL_COMMUNITY): Payer: Medicare FFS | Admitting: Anesthesiology

## 2019-08-17 ENCOUNTER — Encounter (HOSPITAL_COMMUNITY)
Admission: RE | Disposition: A | Discharge status: Home / Self Care | Payer: Self-pay | Source: Home / Self Care | Attending: Internal Medicine

## 2019-08-17 ENCOUNTER — Encounter (HOSPITAL_COMMUNITY): Payer: Self-pay | Admitting: Internal Medicine

## 2019-08-17 ENCOUNTER — Ambulatory Visit (HOSPITAL_COMMUNITY)
Admission: RE | Admit: 2019-08-17 | Discharge: 2019-08-17 | Disposition: A | Discharge status: Home / Self Care | Payer: Medicare FFS | Attending: Internal Medicine | Admitting: Internal Medicine

## 2019-08-17 ENCOUNTER — Other Ambulatory Visit: Payer: Self-pay

## 2019-08-17 DIAGNOSIS — K5909 Other constipation: Secondary | ICD-10-CM | POA: Diagnosis not present

## 2019-08-17 DIAGNOSIS — R1314 Dysphagia, pharyngoesophageal phase: Secondary | ICD-10-CM | POA: Diagnosis present

## 2019-08-17 DIAGNOSIS — Z87891 Personal history of nicotine dependence: Secondary | ICD-10-CM | POA: Insufficient documentation

## 2019-08-17 DIAGNOSIS — R1310 Dysphagia, unspecified: Secondary | ICD-10-CM | POA: Diagnosis not present

## 2019-08-17 DIAGNOSIS — Z9049 Acquired absence of other specified parts of digestive tract: Secondary | ICD-10-CM | POA: Insufficient documentation

## 2019-08-17 DIAGNOSIS — Z79899 Other long term (current) drug therapy: Secondary | ICD-10-CM | POA: Insufficient documentation

## 2019-08-17 DIAGNOSIS — Z8249 Family history of ischemic heart disease and other diseases of the circulatory system: Secondary | ICD-10-CM | POA: Diagnosis not present

## 2019-08-17 DIAGNOSIS — K219 Gastro-esophageal reflux disease without esophagitis: Secondary | ICD-10-CM | POA: Insufficient documentation

## 2019-08-17 DIAGNOSIS — I10 Essential (primary) hypertension: Secondary | ICD-10-CM | POA: Insufficient documentation

## 2019-08-17 DIAGNOSIS — Z791 Long term (current) use of non-steroidal anti-inflammatories (NSAID): Secondary | ICD-10-CM | POA: Insufficient documentation

## 2019-08-17 DIAGNOSIS — Z888 Allergy status to other drugs, medicaments and biological substances status: Secondary | ICD-10-CM | POA: Insufficient documentation

## 2019-08-17 DIAGNOSIS — E785 Hyperlipidemia, unspecified: Secondary | ICD-10-CM | POA: Diagnosis not present

## 2019-08-17 HISTORY — PX: ESOPHAGOGASTRODUODENOSCOPY (EGD) WITH PROPOFOL: SHX5813

## 2019-08-17 HISTORY — PX: BIOPSY: SHX5522

## 2019-08-17 HISTORY — PX: MALONEY DILATION: SHX5535

## 2019-08-17 SURGERY — ESOPHAGOGASTRODUODENOSCOPY (EGD) WITH PROPOFOL
Anesthesia: General

## 2019-08-17 MED ORDER — KETAMINE HCL 10 MG/ML IJ SOLN
INTRAMUSCULAR | Status: DC | PRN
  Administered 2019-08-17: 20 mg via INTRAVENOUS

## 2019-08-17 MED ORDER — KETAMINE HCL 50 MG/5ML IJ SOSY
PREFILLED_SYRINGE | INTRAMUSCULAR | Status: AC
  Filled 2019-08-17: qty 5

## 2019-08-17 MED ORDER — CHLORHEXIDINE GLUCONATE CLOTH 2 % EX PADS: 6.0000 | MEDICATED_PAD | Freq: Once | CUTANEOUS | Status: DC

## 2019-08-17 MED ORDER — LACTATED RINGERS IV SOLN
Freq: Once | INTRAVENOUS | Status: AC
  Administered 2019-08-17: 1000 mL via INTRAVENOUS

## 2019-08-17 MED ORDER — PROPOFOL 10 MG/ML IV BOLUS
INTRAVENOUS | Status: AC
  Filled 2019-08-17: qty 20

## 2019-08-17 MED ORDER — PROPOFOL 10 MG/ML IV BOLUS
INTRAVENOUS | Status: DC | PRN
  Administered 2019-08-17: 100 mg via INTRAVENOUS

## 2019-08-17 MED ORDER — LACTATED RINGERS IV SOLN: INTRAVENOUS | Status: DC | PRN

## 2019-08-17 MED ORDER — LIDOCAINE HCL (CARDIAC) PF 100 MG/5ML IV SOSY
PREFILLED_SYRINGE | INTRAVENOUS | Status: DC | PRN
  Administered 2019-08-17: 50 mg via INTRAVENOUS

## 2019-08-17 MED ORDER — PROPOFOL 500 MG/50ML IV EMUL
INTRAVENOUS | Status: DC | PRN
  Administered 2019-08-17: 150 ug/kg/min via INTRAVENOUS

## 2019-08-17 MED ORDER — PHENYLEPHRINE HCL (PRESSORS) 10 MG/ML IV SOLN
INTRAVENOUS | Status: DC | PRN
  Administered 2019-08-17: 100 ug via INTRAVENOUS

## 2019-08-17 MED ORDER — STERILE WATER FOR IRRIGATION IR SOLN
Status: DC | PRN
  Administered 2019-08-17: 1.5 mL

## 2019-08-17 NOTE — Anesthesia Preprocedure Evaluation (Signed)
Anesthesia Evaluation  Patient identified by MRN, date of birth, ID band Patient awake    Reviewed: Allergy & Precautions, NPO status , Patient's Chart, lab work & pertinent test results  History of Anesthesia Complications (+) PONV and history of anesthetic complications  Airway Mallampati: II  TM Distance: >3 FB Neck ROM: Full    Dental no notable dental hx. (+) Missing, Chipped, Dental Advisory Given,    Pulmonary former smoker,    Pulmonary exam normal breath sounds clear to auscultation       Cardiovascular Exercise Tolerance: Good hypertension, Pt. on medications  Rhythm:Regular Rate:Normal     Neuro/Psych  Neuromuscular disease    GI/Hepatic Neg liver ROS, GERD  Medicated and Controlled,  Endo/Other  negative endocrine ROS  Renal/GU negative Renal ROS     Musculoskeletal  (+) Arthritis , Osteoarthritis,  Chronic back pain on narcotic pain meds    Abdominal   Peds  Hematology negative hematology ROS (+) anemia ,   Anesthesia Other Findings   Reproductive/Obstetrics                             Anesthesia Physical Anesthesia Plan  ASA: III  Anesthesia Plan: General   Post-op Pain Management:    Induction: Intravenous  PONV Risk Score and Plan: TIVA and Treatment may vary due to age or medical condition  Airway Management Planned: Nasal Cannula, Natural Airway and Simple Face Mask  Additional Equipment:   Intra-op Plan:   Post-operative Plan:   Informed Consent: I have reviewed the patients History and Physical, chart, labs and discussed the procedure including the risks, benefits and alternatives for the proposed anesthesia with the patient or authorized representative who has indicated his/her understanding and acceptance.     Dental advisory given  Plan Discussed with: CRNA and Surgeon  Anesthesia Plan Comments:         Anesthesia Quick Evaluation

## 2019-08-17 NOTE — Discharge Instructions (Signed)
EGD Discharge instructions Please read the instructions outlined below and refer to this sheet in the next few weeks. These discharge instructions provide you with general information on caring for yourself after you leave the hospital. Your doctor may also give you specific instructions. While your treatment has been planned according to the most current medical practices available, unavoidable complications occasionally occur. If you have any problems or questions after discharge, please call your doctor. ACTIVITY  You may resume your regular activity but move at a slower pace for the next 24 hours.   Take frequent rest periods for the next 24 hours.   Walking will help expel (get rid of) the air and reduce the bloated feeling in your abdomen.   No driving for 24 hours (because of the anesthesia (medicine) used during the test).   You may shower.   Do not sign any important legal documents or operate any machinery for 24 hours (because of the anesthesia used during the test).  NUTRITION  Drink plenty of fluids.   You may resume your normal diet.   Begin with a light meal and progress to your normal diet.   Avoid alcoholic beverages for 24 hours or as instructed by your caregiver.  MEDICATIONS  You may resume your normal medications unless your caregiver tells you otherwise.  WHAT YOU CAN EXPECT TODAY  You may experience abdominal discomfort such as a feeling of fullness or "gas" pains.  FOLLOW-UP  Your doctor will discuss the results of your test with you.  SEEK IMMEDIATE MEDICAL ATTENTION IF ANY OF THE FOLLOWING OCCUR:  Excessive nausea (feeling sick to your stomach) and/or vomiting.   Severe abdominal pain and distention (swelling).   Trouble swallowing.   Temperature over 101 F (37.8 C).   Rectal bleeding or vomiting of blood.    I recommend you take omeprazole 20 mg every day whether you feel you need it or not.  Further recommendations to follow pending  review of pathology report  Office visit with [place] in 1 year  At patient request, I called Mikeyla Music at [phone] and reviewed results  At patient request, I called Reather Littler at [phone]

## 2019-08-17 NOTE — Op Note (Signed)
[facility] Patient Name: Kathy Coleman Procedure Date: 08/17/2019 7:36 AM MRN: [id number] Date of Birth: 05-23-1956 Attending MD: Gennette Pac , MD CSN: [id number] Age: 64 Admit Type: Outpatient Procedure:                Upper GI endoscopy Indications:              Dysphagia Providers:                Gennette Pac, MD, Buel Ream. Thomasena Edis RN, RN,                            Dyann Ruddle Referring MD:             Kathleene Hazel. Hall MD Medicines:                Propofol per Anesthesia Complications:            No immediate complications. Estimated Blood Loss:     Estimated blood loss was minimal. Procedure:                Pre-Anesthesia Assessment:                           - Prior to the procedure, a History and Physical                            was performed, and patient medications and                            allergies were reviewed. The patient's tolerance of                            previous anesthesia was also reviewed. The risks                            and benefits of the procedure and the sedation                            options and risks were discussed with the patient.                            All questions were answered, and informed consent                            was obtained. Prior Anticoagulants: The patient has                            taken no previous anticoagulant or antiplatelet                            agents. ASA Grade Assessment: II - A patient with                            mild systemic disease. After reviewing the risks  and benefits, the patient was deemed in                            satisfactory condition to undergo the procedure.                           After obtaining informed consent, the endoscope was                            passed under direct vision. Throughout the                            procedure, the patient's blood pressure, pulse, and                            oxygen saturations  were monitored continuously. The                            [id number]-H190 ([id number]) scope was introduced through the                            mouth, and advanced to the second part of duodenum.                            The upper GI endoscopy was accomplished without                            difficulty. The patient tolerated the procedure                            well. Scope In: 9:10:00 AM Scope Out: 9:15:25 AM Total Procedure Duration: 0 hours 5 minutes 25 seconds  Findings:      The examined esophagus was normal.      The entire examined stomach was normal.      The duodenal bulb and second portion of the duodenum were normal. The       scope was withdrawn. Dilation was performed with a Maloney dilator with       mild resistance at 49 Fr. The dilation site was examined following       endoscope reinsertion and showed no change. Estimated blood loss: none.       Finally, biopsies of the mid and distal esophagus were taken for       histologic study Impression:               - Normal esophagus. Dilated /biopsied.                           - Normal stomach.                           - Normal duodenal bulb and second portion of the                            duodenum. Moderate Sedation:      Moderate (conscious) sedation was personally administered by an  anesthesia professional. The following parameters were monitored: oxygen       saturation, heart rate, blood pressure, respiratory rate, EKG, adequacy       of pulmonary ventilation, and response to care. Recommendation:           - Patient has a contact number available for                            emergencies. The signs and symptoms of potential                            delayed complications were discussed with the                            patient. Return to normal activities tomorrow.                            Written discharge instructions were provided to the                            patient.                            - Continue present medications. Recommend aching                            omeprazole 20 mg every day as a scheduled                            medication.- Return to GI clinic in 1 year.                            Follow-up on pathology. Procedure Code(s):        --- Professional ---                           [phone], Esophagogastroduodenoscopy, flexible,                            transoral; diagnostic, including collection of                            specimen(s) by brushing or washing, when performed                            (separate procedure)                           43450, Dilation of esophagus, by unguided sound or                            bougie, single or multiple passes Diagnosis Code(s):        --- Professional ---                           R13.10, Dysphagia, unspecified CPT copyright 2019 American Medical Association. All  rights reserved. The codes documented in this report are preliminary and upon coder review may  be revised to meet current compliance requirements. Gerrit Friends. , MD Gennette Pac, MD 08/17/2019 9:27:29 AM This report has been signed electronically. Number of Addenda: 0

## 2019-08-17 NOTE — Interval H&P Note (Signed)
History and Physical Interval Note:  08/17/2019 8:58 AM  Kathy Coleman  has presented today for surgery, with the diagnosis of dysphagia.  The various methods of treatment have been discussed with the patient and family. After consideration of risks, benefits and other options for treatment, the patient has consented to  Procedure(s) with comments: ESOPHAGOGASTRODUODENOSCOPY (EGD) WITH PROPOFOL (N/A) - 9:15am MALONEY DILATION (N/A) as a surgical intervention.  The patient's history has been reviewed, patient examined, no change in status, stable for surgery.  I have reviewed the patient's chart and labs.  Questions were answered to the patient's satisfaction.        No change.  EGD with esophageal dilation as feasible/appropriate per plan.  The risks, benefits, limitations, alternatives and imponderables have been reviewed with the patient. Potential for esophageal dilation, biopsy, etc. have also been reviewed.  Questions have been answered. All parties agreeable.

## 2019-08-17 NOTE — Anesthesia Postprocedure Evaluation (Signed)
Anesthesia Post Note  Patient: Kathy Coleman  Procedure(s) Performed: ESOPHAGOGASTRODUODENOSCOPY (EGD) WITH PROPOFOL (N/A ) MALONEY DILATION (N/A ) BIOPSY  Patient location during evaluation: PACU Anesthesia Type: MAC Level of consciousness: awake, oriented, awake and alert and patient cooperative Pain management: pain level controlled Vital Signs Assessment: post-procedure vital signs reviewed and stable Respiratory status: spontaneous breathing, respiratory function stable and nonlabored ventilation Cardiovascular status: stable Postop Assessment: no apparent nausea or vomiting Anesthetic complications: no     Last Vitals:  Vitals:   08/17/19 0809  BP: (!) 113/58  Resp: 20  Temp: (!) 36.4 C  SpO2: 97%    Last Pain:  Vitals:   08/17/19 0908  TempSrc:   PainSc: 0-No pain                 ,

## 2019-08-17 NOTE — Transfer of Care (Signed)
Immediate Anesthesia Transfer of Care Note  Patient: Kathy Coleman  Procedure(s) Performed: ESOPHAGOGASTRODUODENOSCOPY (EGD) WITH PROPOFOL (N/A ) MALONEY DILATION (N/A ) BIOPSY  Patient Location: PACU  Anesthesia Type:MAC  Level of Consciousness: awake, alert  and oriented  Airway & Oxygen Therapy: Patient Spontanous Breathing  Post-op Assessment: Report given to RN and Post -op Vital signs reviewed and stable  Post vital signs: Reviewed and stable  Last Vitals:  Vitals Value Taken Time  BP    Temp    Pulse    Resp    SpO2      Last Pain:  Vitals:   08/17/19 0908  TempSrc:   PainSc: 0-No pain      Patients Stated Pain Goal: 9 (50/56/97 9480)  Complications: No apparent anesthesia complications

## 2019-08-18 LAB — SURGICAL PATHOLOGY

## 2019-08-22 ENCOUNTER — Encounter: Payer: Self-pay | Admitting: Internal Medicine

## 2019-09-24 ENCOUNTER — Emergency Department (HOSPITAL_COMMUNITY): Payer: Medicare FFS

## 2019-09-24 ENCOUNTER — Other Ambulatory Visit: Payer: Self-pay

## 2019-09-24 ENCOUNTER — Encounter (HOSPITAL_COMMUNITY): Payer: Self-pay | Admitting: Emergency Medicine

## 2019-09-24 ENCOUNTER — Emergency Department (HOSPITAL_COMMUNITY)
Admission: EM | Admit: 2019-09-24 | Discharge: 2019-09-24 | Disposition: A | Discharge status: Home / Self Care | Payer: Medicare FFS | Attending: Emergency Medicine | Admitting: Emergency Medicine

## 2019-09-24 DIAGNOSIS — Z96642 Presence of left artificial hip joint: Secondary | ICD-10-CM | POA: Insufficient documentation

## 2019-09-24 DIAGNOSIS — Z87891 Personal history of nicotine dependence: Secondary | ICD-10-CM | POA: Insufficient documentation

## 2019-09-24 DIAGNOSIS — Y9301 Activity, walking, marching and hiking: Secondary | ICD-10-CM | POA: Diagnosis not present

## 2019-09-24 DIAGNOSIS — Z96651 Presence of right artificial knee joint: Secondary | ICD-10-CM | POA: Insufficient documentation

## 2019-09-24 DIAGNOSIS — S5002XA Contusion of left elbow, initial encounter: Secondary | ICD-10-CM | POA: Diagnosis not present

## 2019-09-24 DIAGNOSIS — W01198A Fall on same level from slipping, tripping and stumbling with subsequent striking against other object, initial encounter: Secondary | ICD-10-CM | POA: Diagnosis not present

## 2019-09-24 DIAGNOSIS — Y9289 Other specified places as the place of occurrence of the external cause: Secondary | ICD-10-CM | POA: Diagnosis not present

## 2019-09-24 DIAGNOSIS — Z23 Encounter for immunization: Secondary | ICD-10-CM | POA: Diagnosis not present

## 2019-09-24 DIAGNOSIS — Y998 Other external cause status: Secondary | ICD-10-CM | POA: Insufficient documentation

## 2019-09-24 DIAGNOSIS — S0990XA Unspecified injury of head, initial encounter: Secondary | ICD-10-CM | POA: Diagnosis present

## 2019-09-24 DIAGNOSIS — Z96641 Presence of right artificial hip joint: Secondary | ICD-10-CM | POA: Insufficient documentation

## 2019-09-24 DIAGNOSIS — Z96652 Presence of left artificial knee joint: Secondary | ICD-10-CM | POA: Insufficient documentation

## 2019-09-24 DIAGNOSIS — Z79899 Other long term (current) drug therapy: Secondary | ICD-10-CM | POA: Diagnosis not present

## 2019-09-24 MED ORDER — TETANUS-DIPHTH-ACELL PERTUSSIS 5-2.5-18.5 LF-MCG/0.5 IM SUSP
0.5000 mL | Freq: Once | INTRAMUSCULAR | Status: AC
  Administered 2019-09-24: 0.5 mL via INTRAMUSCULAR
  Filled 2019-09-24: qty 0.5

## 2019-09-24 MED ORDER — HYDROCODONE-ACETAMINOPHEN 5-325 MG PO TABS
1.0000 | ORAL_TABLET | Freq: Once | ORAL | Status: AC
  Administered 2019-09-24: 21:00:00 1 via ORAL
  Filled 2019-09-24: qty 1

## 2019-09-24 MED ORDER — HYDROCODONE-ACETAMINOPHEN 5-325 MG PO TABS: 1.0000 | ORAL_TABLET | ORAL | 0 refills | Status: AC | PRN

## 2019-09-24 NOTE — ED Provider Notes (Signed)
[facility] EMERGENCY DEPARTMENT Provider Note   CSN: [id number] Arrival date & time: 09/24/19  1821     History Chief Complaint  Patient presents with  . Fall    Kathy Coleman is a 64 y.o. female presenting for evaluation of injury sustained in a fall approximately 6 PM this evening.  She was outdoors when she tripped over her dog and fell on pavement striking her left elbow and her left posterior head on the pavement.  She denies LOC, also denies nausea, vomiting, dizziness, vision changes weakness or numbness since the event.  She does endorse a moderate headache and is very tender at the site of her scalp injury.  She also has tenderness at her left lateral elbow with swelling and early bruising.  She denies weakness or numbness distal to this injury site.  She has had no treatment prior to arrival.  She is not current with her tetanus vaccine.  Patient is not on any blood thinning medications.  The history is provided by the patient and the spouse.       Past Medical History:  Diagnosis Date  . Arthritis   . Constipation    takes Miralax nightly  . GERD (gastroesophageal reflux disease)    takes Zantac daily  . History of blood transfusion   . History of kidney stones   . Hyperlipidemia    takes Atorvastatin daily  . Hypertension    takes Lisinopril daily  . Osteoarthritis    left hip  . Osteoarthritis of left knee   . PONV (postoperative nausea and vomiting)    in the past did not have with last surgery in december; did well w/scopalamine patch    Patient Active Problem List   Diagnosis Date Noted  . Lumbar spondylosis with myelopathy 03/14/2019  . Primary osteoarthritis of left knee 06/21/2017  . Degenerative arthritis of left knee 06/16/2017  . Herniated nucleus pulposus, lumbar 10/05/2016  . Primary localized osteoarthritis of left hip 06/15/2016  . Primary osteoarthritis of left hip 06/12/2016  . Dysphagia 05/15/2016  . Arthritis of knee 06/24/2015  .  Primary osteoarthritis of right knee 06/22/2015  . Primary osteoarthritis of right hip 05/11/2015  . Postoperative anemia due to acute blood loss 07/24/2014  . Lumbar radiculopathy 07/23/2014    Past Surgical History:  Procedure Laterality Date  . ABDOMINAL HYSTERECTOMY    . ANTERIOR LAT LUMBAR FUSION N/A 03/14/2019   Procedure: THORACIC TWELVE TO LUMBAR ONE ANTEROLATERAL DECOMPRESSION;  Surgeon: Kristeen Miss, MD;  Location: [place];  Service: Neurosurgery;  Laterality: N/A;  . APPLICATION OF ROBOTIC ASSISTANCE FOR SPINAL PROCEDURE N/A 03/14/2019   Procedure: APPLICATION OF ROBOTIC ASSISTANCE FOR SPINAL PROCEDURE;  Surgeon: Kristeen Miss, MD;  Location: [place];  Service: Neurosurgery;  Laterality: N/A;  . [place]  , 2010  18 LOW BACK    . BIOPSY  02/13/2016   Procedure: BIOPSY;  Surgeon: Daneil Dolin, MD;  [facility];  Service: Endoscopy;;  Gastric and Esophageal biopsies  . BIOPSY  08/17/2019   Procedure: BIOPSY;  Surgeon: Daneil Dolin, MD;  [facility];  Service: Endoscopy;;  . CHOLECYSTECTOMY    . COLONOSCOPY  03/24/2006   Dr.Rourk- a couple of anal papillae, o/w normal rectum with L sided diverticula, the remainder of the colonic mucosa appeared normal.   . COLONOSCOPY N/A 02/13/2016   Dr. Gala Romney: diverticulosis, melanosis coli   . ESOPHAGOGASTRODUODENOSCOPY  03/24/2006   Dr.Rourk-  normal esophagus. a couple of antral erosions, o/w normal stomach, patent pylorus, normal D1,D2  . ESOPHAGOGASTRODUODENOSCOPY N/A 02/13/2016   Dr. Jena Gaussourk: erythematous mucosa, normal D2, empiric dilation   . ESOPHAGOGASTRODUODENOSCOPY (EGD) WITH PROPOFOL N/A 08/17/2019   Procedure: ESOPHAGOGASTRODUODENOSCOPY (EGD) WITH PROPOFOL;  Surgeon: Corbin Adeourk, Robert M, MD;  [facility];  Service: Endoscopy;  Laterality: N/A;  9:15am  . FOOT SURGERY     RIGHT  2014  . LUMBAR PERCUTANEOUS PEDICLE SCREW 1 LEVEL N/A 03/14/2019   Procedure: ZOXWRAZOR GUIDED PEDICLE SCREWS THORACIC TEN  TO Lumbar THREE;  Surgeon: Barnett AbuElsner, Henry, MD;  [facility];  Service: Neurosurgery;  Laterality: N/A;  . Elease HashimotoMALONEY DILATION N/A 02/13/2016   Procedure: Elease HashimotoMALONEY DILATION;  Surgeon: Corbin Adeobert M Rourk, MD;  [facility];  Service: Endoscopy;  Laterality: N/A;  Elease Hashimoto. MALONEY DILATION N/A 08/17/2019   Procedure: Elease HashimotoMALONEY DILATION;  Surgeon: Corbin Adeourk, Robert M, MD;  [facility];  Service: Endoscopy;  Laterality: N/A;  . NECK SURGERY     2005, 2010  . TOTAL HIP ARTHROPLASTY Right 05/13/2015   Procedure: TOTAL HIP ARTHROPLASTY ANTERIOR APPROACH;  Surgeon: Gean BirchwoodFrank Rowan, MD;  Location: [facility];  Service: Orthopedics;  Laterality: Right;  . TOTAL HIP ARTHROPLASTY Left 06/15/2016   Procedure: TOTAL HIP ARTHROPLASTY ANTERIOR APPROACH;  Surgeon: Gean BirchwoodFrank Rowan, MD;  Location: [facility];  Service: Orthopedics;  Laterality: Left;  . TOTAL KNEE ARTHROPLASTY Right 06/24/2015   Procedure: TOTAL KNEE ARTHROPLASTY;  Surgeon: Gean BirchwoodFrank Rowan, MD;  Location: [facility];  Service: Orthopedics;  Laterality: Right;  . TOTAL KNEE ARTHROPLASTY Left 06/21/2017   Procedure: LEFT TOTAL KNEE ARTHROPLASTY;  Surgeon: Gean Birchwoodowan, Frank, MD;  Location: [facility];  Service: Orthopedics;  Laterality: Left;     OB History   No obstetric history on file.     Family History  Problem Relation Age of Onset  . Hypertension Mother   . COPD Mother   . Diabetes Mother   . Heart disease Father   . Diabetes Father   . Hypertension Father     Social History   Tobacco Use  . Smoking status: Former Smoker    Quit date: 06/18/1981    Years since quitting: 38.2  . Smokeless tobacco: Never Used  . Tobacco comment: Quit x 30 years  Substance Use Topics  . Alcohol use: No    Alcohol/week: 0.0 standard drinks  . Drug use: No    Home Medications Prior to Admission medications   Medication Sig Start Date End Date Taking? Authorizing Provider  atorvastatin (LIPITOR) 40 MG tablet Take 40 mg by mouth at bedtime.    [provider]    celecoxib (CELEBREX) 200 MG capsule Take 200 mg by mouth daily.    [provider]  cephALEXin (KEFLEX) 500 MG capsule Take 500 mg by mouth 2 (two) times daily.     [provider]  gabapentin (NEURONTIN) 300 MG capsule Take 300 mg by mouth 3 (three) times daily.    [provider]  HYDROcodone-acetaminophen (NORCO/VICODIN) 5-325 MG tablet Take 1 tablet by mouth every 4 (four) hours as needed. 09/24/19   Burgess AmorIdol, , PA-C  lisinopril-hydrochlorothiazide (ZESTORETIC) 20-25 MG tablet Take 1 tablet by mouth daily.    [provider]  mupirocin ointment (BACTROBAN) 2 % Apply 1 application topically daily. 08/07/19   [provider]  omeprazole (PRILOSEC) 20 MG capsule Take 20 mg by mouth as needed (acid reflux/indigestion.).     [provider]  polyethylene glycol (  MIRALAX / GLYCOLAX) packet Take 17 g by mouth daily.     [provider]  traMADol (ULTRAM) 50 MG tablet Take 50 mg by mouth 2 (two) times daily.     [provider]    Allergies    Decadron [dexamethasone]  Review of Systems   Review of Systems  Constitutional: Negative for fever.  HENT: Negative.  Negative for facial swelling.   Eyes: Negative.  Negative for visual disturbance.  Respiratory: Negative.   Cardiovascular: Negative.   Gastrointestinal: Negative for abdominal pain, nausea and vomiting.  Genitourinary: Negative.   Musculoskeletal: Positive for arthralgias and back pain. Negative for joint swelling.  Skin: Positive for color change and wound. Negative for rash.  Neurological: Positive for headaches. Negative for dizziness, weakness, light-headedness and numbness.  Psychiatric/Behavioral: Negative.     Physical Exam Updated Vital Signs BP 122/68   Pulse 65   Temp 97.9 F (36.6 C) (Oral)   Resp 16   Ht 5\' 5"  (1.651 m)   Wt 74.8 kg   SpO2 100%   BMI 27.46 kg/m   Physical Exam Vitals and nursing note reviewed.  Constitutional:       Appearance: She is well-developed.  HENT:     Head: Normocephalic. No raccoon eyes or Battle's sign.      Comments: Contusion of left parietal scalp.  There is a superficial abrasion as well which is hemostatic.    Mouth/Throat:     Mouth: Mucous membranes are moist.  Eyes:     Extraocular Movements: Extraocular movements intact.     Conjunctiva/sclera: Conjunctivae normal.     Pupils: Pupils are equal, round, and reactive to light.  Cardiovascular:     Rate and Rhythm: Normal rate and regular rhythm.     Heart sounds: Normal heart sounds.  Pulmonary:     Effort: Pulmonary effort is normal.     Breath sounds: Normal breath sounds. No wheezing.  Abdominal:     General: Bowel sounds are normal.     Palpations: Abdomen is soft.     Tenderness: There is no abdominal tenderness.  Musculoskeletal:     Left elbow: Swelling present. No effusion.     Cervical back: Tenderness and bony tenderness present. No swelling or deformity. Decreased range of motion.     Thoracic back: Normal. No bony tenderness.     Lumbar back: Normal. No bony tenderness.     Comments: Midline bony tenderness of cervical spine without deformity.  Well-healed surgical incision midline lumbar.  Left elbow has modest tenderness at her olecranon.  There is no tenderness palpation at her radial head.  Early bruising noted.  Distal sensation is intact with equal grip strength.  Skin:    General: Skin is warm and dry.  Neurological:     Mental Status: She is alert.     ED Results / Procedures / Treatments   Labs (all labs ordered are listed, but only abnormal results are displayed) Labs Reviewed - No data to display  EKG None  Radiology DG Elbow Complete Left  Result Date: 09/24/2019 CLINICAL DATA:  Status post fall. EXAM: LEFT ELBOW - COMPLETE 3+ VIEW COMPARISON:  None. FINDINGS: There is a very small cortical defect seen involving the left radial head. This appears to be chronic versus congenital in nature.  There is no evidence of dislocation. There is no evidence of arthropathy. Soft tissues are unremarkable. 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 CT correlation  is recommended if acute fracture remains of clinical concern. Electronically Signed   By: Aram Candela M.D.   On: 09/24/2019 19:45   CT Head Wo Contrast  Result Date: 09/24/2019 CLINICAL DATA:  Fall, hit head.  Headache EXAM: CT HEAD WITHOUT CONTRAST TECHNIQUE: Contiguous axial images were obtained from the base of the skull through the vertex without intravenous contrast. COMPARISON:  None. FINDINGS: Brain: No acute intracranial abnormality. Specifically, no hemorrhage, hydrocephalus, mass lesion, acute infarction, or significant intracranial injury. Vascular: No hyperdense vessel or unexpected calcification. Skull: No acute calvarial abnormality. Sinuses/Orbits: Visualized paranasal sinuses and mastoids clear. Orbital soft tissues unremarkable. Other: Soft tissue swelling in the left posterior scalp. IMPRESSION: No intracranial abnormality. Electronically Signed   By: Charlett Nose M.D.   On: 09/24/2019 20:36   CT Cervical Spine Wo Contrast  Result Date: 09/24/2019 CLINICAL DATA:  Fall.  Neck tenderness. EXAM: CT CERVICAL SPINE WITHOUT CONTRAST TECHNIQUE: Multidetector CT imaging of the cervical spine was performed without intravenous contrast. Multiplanar CT image reconstructions were also generated. COMPARISON:  09/09/2007 FINDINGS: Alignment: Slight anterolisthesis of C7 on T1 related to facet disease. Skull base and vertebrae: No acute fracture. No primary bone lesion or focal pathologic process. Soft tissues and spinal canal: No prevertebral fluid or swelling. No visible canal hematoma. Disc levels: Prior anterior fusion C3-C7. Solid bony fusion across the disc spaces. No hardware complicating feature. Mild diffuse degenerative facet disease. Upper chest: No acute findings  Other: None IMPRESSION: Prior anterior fusion C3-C7. No acute bony abnormality. Electronically Signed   By: Charlett Nose M.D.   On: 09/24/2019 20:37    Procedures Procedures (including critical care time)  Medications Ordered in ED Medications  Tdap (BOOSTRIX) injection 0.5 mL (0.5 mLs Intramuscular Given 09/24/19 1957)  HYDROcodone-acetaminophen (NORCO/VICODIN) 5-325 MG per tablet 1 tablet (1 tablet Oral Given 09/24/19 2129)    ED Course  I have reviewed the triage vital signs and the nursing notes.  Pertinent labs & imaging results that were available during my care of the patient were reviewed by me and considered in my medical decision making (see chart for details).    MDM Rules/Calculators/A&P                      Imaging reviewed and discussed with patient.  She has a nonfocal neuro exam, no symptoms to suggest concussion at this time.  She was given head injury instructions.  Her tetanus was updated given her scalp abrasion.  She was placed in a sling for supportive care of her elbow injury.  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  She is nontender at the site and I agree with this being a chronic finding and not a new injury.  She was however given instructions to follow-up with her orthopedist in [place] for recheck of her elbow if her pain persist despite today's treatment plan.  Discussed ice and heat therapy.  She was prescribed a small quantity of hydrocodone, cautions given.  As needed follow-up anticipated. Final Clinical Impression(s) / ED Diagnoses Final diagnoses:  Minor head injury, initial encounter  Contusion of left elbow, initial encounter    Rx / DC Orders ED Discharge Orders         Ordered    HYDROcodone-acetaminophen (NORCO/VICODIN) 5-325 MG tablet  Every 4 hours PRN     09/24/19 2121           Burgess Amor, PA-C 09/24/19 2354    [place],  Jomarie Longs, MD 09/26/19 1022

## 2019-09-24 NOTE — Discharge Instructions (Signed)
Continue to apply ice to your elbow and your scalp injury is much as is comfortable as this will help with pain and swelling.  Referred to the head injury instructions below.  You may use the hydrocodone prescribed for pain relief, however use caution with this medication as it will make you drowsy.  Do not drive within 4 hours of taking this medication.  Your CT imaging is reassuring tonight that there is no intracranial injury.  It is possible to develop postconcussion symptoms days after you have had a significant head injury.  This is not a finding that shows up on any CT scans, but is diagnosed based on symptoms.  Please follow-up with your primary doctor if you develop any new symptoms as outlined that are not completely better over the next week.  I suspect your elbow is tender secondary to bruising, however if this is not improving, call your orthopedist for further evaluation as discussed.

## 2019-09-24 NOTE — ED Triage Notes (Signed)
Patient states that she fell x 1 hour ago because she tripped over the dog. Patient states that when she fell she hit her head on the concrete. Patient denies any loss of consciousness. Patient complaining of pain to the back of the head and back and left elbow.

## 2020-02-28 ENCOUNTER — Other Ambulatory Visit (HOSPITAL_COMMUNITY): Payer: Self-pay | Admitting: Internal Medicine

## 2020-02-28 ENCOUNTER — Other Ambulatory Visit: Payer: Self-pay

## 2020-02-28 ENCOUNTER — Ambulatory Visit (HOSPITAL_COMMUNITY)
Admission: RE | Admit: 2020-02-28 | Discharge: 2020-02-28 | Disposition: A | Discharge status: Home / Self Care | Payer: Medicare FFS | Source: Ambulatory Visit | Attending: Internal Medicine | Admitting: Internal Medicine

## 2020-02-28 DIAGNOSIS — W19XXXA Unspecified fall, initial encounter: Secondary | ICD-10-CM | POA: Diagnosis not present

## 2020-02-28 DIAGNOSIS — M546 Pain in thoracic spine: Secondary | ICD-10-CM | POA: Insufficient documentation

## 2020-07-16 ENCOUNTER — Ambulatory Visit: Payer: Medicare FFS | Admitting: Podiatry

## 2020-07-16 ENCOUNTER — Encounter: Payer: Self-pay | Admitting: Podiatry

## 2020-07-16 ENCOUNTER — Ambulatory Visit (INDEPENDENT_AMBULATORY_CARE_PROVIDER_SITE_OTHER): Payer: Medicare FFS

## 2020-07-16 ENCOUNTER — Other Ambulatory Visit: Payer: Self-pay

## 2020-07-16 DIAGNOSIS — L97521 Non-pressure chronic ulcer of other part of left foot limited to breakdown of skin: Secondary | ICD-10-CM | POA: Diagnosis not present

## 2020-07-16 DIAGNOSIS — M21611 Bunion of right foot: Secondary | ICD-10-CM

## 2020-07-16 DIAGNOSIS — L84 Corns and callosities: Secondary | ICD-10-CM

## 2020-07-16 DIAGNOSIS — M216X9 Other acquired deformities of unspecified foot: Secondary | ICD-10-CM

## 2020-07-16 DIAGNOSIS — M21612 Bunion of left foot: Secondary | ICD-10-CM

## 2020-07-16 MED ORDER — DOXYCYCLINE HYCLATE 100 MG PO TABS: 100.0000 mg | ORAL_TABLET | Freq: Two times a day (BID) | ORAL | 0 refills | Status: DC

## 2020-07-16 MED ORDER — MUPIROCIN 2 % EX OINT: 1.0000 "application " | TOPICAL_OINTMENT | Freq: Two times a day (BID) | CUTANEOUS | 2 refills | Status: DC

## 2020-07-16 NOTE — Patient Instructions (Signed)
Corns and Calluses Corns are small areas of thickened skin that form on the top, sides, or tip of a toe. Corns have a cone-shaped core with a point that can press on a nerve below. This causes pain. Calluses are areas of thickened skin that can form anywhere on the body, including the hands, fingers, palms, soles of the feet, and heels. Calluses are usually larger than corns. What are the causes? Corns and calluses are caused by rubbing (friction) or pressure, such as from shoes that are too tight or do not fit properly. What increases the risk? Corns are more likely to develop in people who have misshapen toes (toe deformities), such as hammer toes. Calluses can form with friction to any area of the skin. They are more likely to develop in people who:  Work with their hands.  Wear shoes that fit poorly, are too tight, or are high-heeled.  Have toe deformities. What are the signs or symptoms? Symptoms of a corn or callus include:  A hard growth on the skin.  Pain or tenderness under the skin.  Redness and swelling.  Increased discomfort while wearing tight-fitting shoes, if your feet are affected. If a corn or callus becomes infected, symptoms may include:  Redness and swelling that gets worse.  Pain.  Fluid, blood, or pus draining from the corn or callus.   How is this diagnosed? Corns and calluses may be diagnosed based on your symptoms, your medical history, and a physical exam. How is this treated? Treatment for corns and calluses may include:  Removing the cause of the friction or pressure. This may involve: ? Changing your shoes. ? Wearing shoe inserts (orthotics) or other protective layers in your shoes, such as a corn pad. ? Wearing gloves.  Applying medicine to the skin (topical medicine) to help soften skin in the hardened, thickened areas.  Removing layers of dead skin with a file to reduce the size of the corn or callus.  Removing the corn or callus with a  scalpel or laser.  Taking antibiotic medicines, if your corn or callus is infected.  Having surgery, if a toe deformity is the cause. Follow these instructions at home:  Take over-the-counter and prescription medicines only as told by your health care provider.  If you were prescribed an antibiotic medicine, take it as told by your health care provider. Do not stop taking it even if your condition improves.  Wear shoes that fit well. Avoid wearing high-heeled shoes and shoes that are too tight or too loose.  Wear any padding, protective layers, gloves, or orthotics as told by your health care provider.  Soak your hands or feet. Then use a file or pumice stone to soften your corn or callus. Do this as told by your health care provider.  Check your corn or callus every day for signs of infection.   Contact a health care provider if:  Your symptoms do not improve with treatment.  You have redness or swelling that gets worse.  Your corn or callus becomes painful.  You have fluid, blood, or pus coming from your corn or callus.  You have new symptoms. Get help right away if:  You develop severe pain with redness. Summary  Corns are small areas of thickened skin that form on the top, sides, or tip of a toe. These can be painful.  Calluses are areas of thickened skin that can form anywhere on the body, including the hands, fingers, palms, and soles of the   feet. Calluses are usually larger than corns.  Corns and calluses are caused by rubbing (friction) or pressure, such as from shoes that are too tight or do not fit properly.  Treatment may include wearing padding, protective layers, gloves, or orthotics as told by your health care provider. This information is not intended to replace advice given to you by your health care provider. Make sure you discuss any questions you have with your health care provider. Document Revised: 10/19/2019 Document Reviewed: 10/19/2019 Elsevier  Patient Education  2021 [employer]  

## 2020-07-22 NOTE — Progress Notes (Signed)
Subjective:   Patient ID: Kathy Coleman, female   DOB: 65 y.o.   MRN: [id number]   HPI 65 year old female presents the office today for concerns of calluses to both of her feet and she is concerned of possible infection underneath the callus on the left foot.  She has not clear drainage but no pus.  Denies any redness or red streaks.  The calluses do become tender.  She has a history of right foot bunion surgery approximately 7 years ago performed by another physician.  She had no recent treatment.  She has no other concerns today.   Review of Systems  All other systems reviewed and are negative.  Past Medical History:  Diagnosis Date  . Arthritis   . Constipation    takes Miralax nightly  . GERD (gastroesophageal reflux disease)    takes Zantac daily  . History of blood transfusion   . History of kidney stones   . Hyperlipidemia    takes Atorvastatin daily  . Hypertension    takes Lisinopril daily  . Osteoarthritis    left hip  . Osteoarthritis of left knee   . PONV (postoperative nausea and vomiting)    in the past did not have with last surgery in december; did well w/scopalamine patch    Past Surgical History:  Procedure Laterality Date  . ABDOMINAL HYSTERECTOMY    . ANTERIOR LAT LUMBAR FUSION N/A 03/14/2019   Procedure: THORACIC TWELVE TO LUMBAR ONE ANTEROLATERAL DECOMPRESSION;  Surgeon: Barnett Abu, MD;  Location: [facility] OR;  Service: Neurosurgery;  Laterality: N/A;  . APPLICATION OF ROBOTIC ASSISTANCE FOR SPINAL PROCEDURE N/A 03/14/2019   Procedure: APPLICATION OF ROBOTIC ASSISTANCE FOR SPINAL PROCEDURE;  Surgeon: Barnett Abu, MD;  Location: [facility] OR;  Service: Neurosurgery;  Laterality: N/A;  . BACK SURGERY     1998  , 2010  17 LOW BACK    . BIOPSY  02/13/2016   Procedure: BIOPSY;  Surgeon: Corbin Ade, MD;  Location: [facility];  Service: Endoscopy;;  Gastric and Esophageal biopsies  . BIOPSY  08/17/2019   Procedure: BIOPSY;  Surgeon: Corbin Ade, MD;  Location: [facility];  Service: Endoscopy;;  . CHOLECYSTECTOMY    . COLONOSCOPY  03/24/2006   Dr.Rourk- a couple of anal papillae, o/w normal rectum with L sided diverticula, the remainder of the colonic mucosa appeared normal.   . COLONOSCOPY N/A 02/13/2016   Dr. Jena Gauss: diverticulosis, melanosis coli   . ESOPHAGOGASTRODUODENOSCOPY  03/24/2006   Dr.Rourk- normal esophagus. a couple of antral erosions, o/w normal stomach, patent pylorus, normal D1,D2  . ESOPHAGOGASTRODUODENOSCOPY N/A 02/13/2016   Dr. Jena Gauss: erythematous mucosa, normal D2, empiric dilation   . ESOPHAGOGASTRODUODENOSCOPY (EGD) WITH PROPOFOL N/A 08/17/2019   Procedure: ESOPHAGOGASTRODUODENOSCOPY (EGD) WITH PROPOFOL;  Surgeon: Corbin Ade, MD;  [facility]: [facility];  Service: Endoscopy;  Laterality: N/A;  9:15am  . FOOT SURGERY     RIGHT  2014  . LUMBAR PERCUTANEOUS PEDICLE SCREW 1 LEVEL N/A 03/14/2019   Procedure: GOTLX GUIDED PEDICLE SCREWS THORACIC TEN TO Lumbar THREE;  Surgeon: Barnett Abu, MD;  Location: [facility] OR;  Service: Neurosurgery;  Laterality: N/A;  . Elease Hashimoto DILATION N/A 02/13/2016   Procedure: Elease Hashimoto DILATION;  Surgeon: Corbin Ade, MD;  [facility]: [facility];  Service: Endoscopy;  Laterality: N/A;  Elease Hashimoto DILATION N/A 08/17/2019   Procedure: Elease Hashimoto DILATION;  Surgeon: Corbin Ade, MD;  [facility]: [facility];  Service: Endoscopy;  Laterality: N/A;  .  NECK SURGERY     2005, 2010  . TOTAL HIP ARTHROPLASTY Right 05/13/2015   Procedure: TOTAL HIP ARTHROPLASTY ANTERIOR APPROACH;  Surgeon: Gean Birchwood, MD;  Location: [facility] OR;  Service: Orthopedics;  Laterality: Right;  . TOTAL HIP ARTHROPLASTY Left 06/15/2016   Procedure: TOTAL HIP ARTHROPLASTY ANTERIOR APPROACH;  Surgeon: Gean Birchwood, MD;  Location: [facility] OR;  Service: Orthopedics;  Laterality: Left;  . TOTAL KNEE ARTHROPLASTY Right 06/24/2015   Procedure: TOTAL KNEE ARTHROPLASTY;  Surgeon: Gean Birchwood, MD;  Location: [facility] OR;  Service: Orthopedics;  Laterality: Right;  .  TOTAL KNEE ARTHROPLASTY Left 06/21/2017   Procedure: LEFT TOTAL KNEE ARTHROPLASTY;  Surgeon: Gean Birchwood, MD;  Location: [facility] OR;  Service: Orthopedics;  Laterality: Left;     Current Outpatient Medications:  .  doxycycline (VIBRA-TABS) 100 MG tablet, Take 1 tablet (100 mg total) by mouth 2 (two) times daily., Disp: 20 tablet, Rfl: 0 .  mupirocin ointment (BACTROBAN) 2 %, Apply 1 application topically 2 (two) times daily., Disp: 30 g, Rfl: 2 .  atorvastatin (LIPITOR) 40 MG tablet, Take 40 mg by mouth at bedtime., Disp: , Rfl:  .  celecoxib (CELEBREX) 200 MG capsule, Take 200 mg by mouth daily., Disp: , Rfl:  .  cephALEXin (KEFLEX) 500 MG capsule, Take 500 mg by mouth 2 (two) times daily. , Disp: , Rfl:  .  gabapentin (NEURONTIN) 300 MG capsule, Take 300 mg by mouth 3 (three) times daily., Disp: , Rfl:  .  HYDROcodone-acetaminophen (NORCO/VICODIN) 5-325 MG tablet, Take 1 tablet by mouth every 4 (four) hours as needed., Disp: 10 tablet, Rfl: 0 .  lisinopril-hydrochlorothiazide (ZESTORETIC) 20-25 MG tablet, Take 1 tablet by mouth daily., Disp: , Rfl:  .  mupirocin ointment (BACTROBAN) 2 %, Apply 1 application topically daily., Disp: , Rfl:  .  omeprazole (PRILOSEC) 20 MG capsule, Take 20 mg by mouth as needed (acid reflux/indigestion.). , Disp: , Rfl:  .  polyethylene glycol (MIRALAX / GLYCOLAX) packet, Take 17 g by mouth daily. , Disp: , Rfl:  .  traMADol (ULTRAM) 50 MG tablet, Take 50 mg by mouth 2 (two) times daily. , Disp: , Rfl:   Allergies  Allergen Reactions  . Decadron [Dexamethasone] Other (See Comments)    Severe burning in perineal area         Objective:  Physical Exam  General: AAO x3, NAD  Dermatological: Thick hyperkeratotic lesions right foot submetatarsal 1 and 3.  No underlying ulceration drainage or any signs of infection on the right foot.  On the medial aspect the left hallux is a hyperkeratotic lesion with some clear drainage expressed with there is no purulence.   Superficial granular wound is present although small.  No probing to my minor tunneling.  No surrounding erythema, ascending cellulitis.  No fluctuation, crepitation, malodor.  Vascular: Dorsalis Pedis artery and Posterior Tibial artery pedal pulses are 2/4 bilateral with immedate capillary fill time. There is no pain with calf compression, swelling, warmth, erythema.   Neruologic: Grossly intact via light touch bilateral.   Musculoskeletal: Bunions present bilaterally right side worse than left.  Hammertoe contractures resulting as well.  Prominent metatarsal heads.  Muscular strength 5/5 in all groups tested bilateral.  Gait: Unassisted, Nonantalgic.       Assessment:   Preulcerative calluses right foot, callus left foot with superficial wound     Plan:  -Treatment options discussed including all alternatives, risks, and complications -Etiology of symptoms were discussed -X-rays were obtained and reviewed with the  patient.  No evidence of acute fracture, osteomyelitis.  Hardware intact along the first metatarsal on the right foot.  Hammertoes are present. -Debride the preulcerative calluses on the right foot without any complications or bleeding.  Recommend moisturizer and offloading daily.  Discussed shoe modifications, orthotics.  Consider revisional surgery but she wants to hold off on this. -On the left foot sharp debrided hyperkeratotic lesion to reveal the underlying ulceration which was small.  Prescribe doxycycline and recommended mupirocin ointment dressing changes daily.  Offloading. Monitor for any clinical signs or symptoms of infection and directed to call the office immediately should any occur or go to the ER.  Return in about 2 weeks (around 07/30/2020).  Vivi Barrack DPM

## 2020-07-30 ENCOUNTER — Other Ambulatory Visit: Payer: Self-pay

## 2020-07-30 ENCOUNTER — Ambulatory Visit: Payer: Medicare FFS | Admitting: Podiatry

## 2020-07-30 DIAGNOSIS — L84 Corns and callosities: Secondary | ICD-10-CM

## 2020-07-30 DIAGNOSIS — L97521 Non-pressure chronic ulcer of other part of left foot limited to breakdown of skin: Secondary | ICD-10-CM | POA: Diagnosis not present

## 2020-07-30 DIAGNOSIS — M216X9 Other acquired deformities of unspecified foot: Secondary | ICD-10-CM | POA: Diagnosis not present

## 2020-07-30 DIAGNOSIS — M19071 Primary osteoarthritis, right ankle and foot: Secondary | ICD-10-CM | POA: Diagnosis not present

## 2020-08-04 NOTE — Progress Notes (Signed)
Subjective: 65 year old female presents the office today for follow-up evaluation of a infected callus on the left foot.  She states there is too much better.  She has been keeping the area bandage daily and she finished the course of antibiotics.  She denies any swelling or redness or any drainage.  She also wants to consider surgery on her right foot.  She gets painful calluses submetatarsal 1 and 3 she previously had a bunionectomy performed unfortunately still has a bunion.  This is causing discomfort on daily basis and she wants to discuss other treatment options. Denies any systemic complaints such as fevers, chills, nausea, vomiting. No acute changes since last appointment, and no other complaints at this time.   Objective: AAO x3, NAD DP/PT pulses palpable bilaterally, CRT less than 3 seconds LEFT: On the left foot there is no ulceration identified and there is no significant callus present on the left medial hallux there is no edema, erythema, drainage or pus or signs of infection.  No pain. RIGHT foot: Significant bunion is present with hammertoe contractures and there is prominence of metatarsal heads plantarly with atrophy of the fat pad causing discomfort.  Tenderness on the bunion as well as the painful calluses. No pain with calf compression, swelling, warmth, erythema  Assessment: 65 year old female with resolved infection left hallux; right foot pain, bunion/capsulitis  Plan: -All treatment options discussed with the patient including all alternatives, risks, complications.  -Left foot is doing well.  Continue to wash with soap and water daily and dry thoroughly.  Monitor for any signs or symptoms of recurrence of infection callus formation. -Regards to the right foot discuss surgical invention.  Given her previous surgeries I will order a CT scan of the foot for surgical planning. -Patient encouraged to call the office with any questions, concerns, change in symptoms.   Vivi Barrack DPM

## 2020-08-08 ENCOUNTER — Other Ambulatory Visit: Payer: Self-pay

## 2020-08-08 ENCOUNTER — Ambulatory Visit (HOSPITAL_COMMUNITY)
Admission: RE | Admit: 2020-08-08 | Discharge: 2020-08-08 | Disposition: A | Discharge status: Home / Self Care | Payer: Medicare FFS | Source: Ambulatory Visit | Attending: Podiatry | Admitting: Podiatry

## 2020-08-08 ENCOUNTER — Encounter: Payer: Self-pay | Admitting: Internal Medicine

## 2020-08-08 DIAGNOSIS — M19071 Primary osteoarthritis, right ankle and foot: Secondary | ICD-10-CM | POA: Diagnosis present

## 2020-09-01 IMAGING — DX DG ELBOW COMPLETE 3+V*L*
4 series · 4 of 4 positions shown · non-contrast
Comparison: None.

CLINICAL DATA: Status post fall.

EXAM:
LEFT ELBOW - COMPLETE 3+ VIEW

[elbow ap]
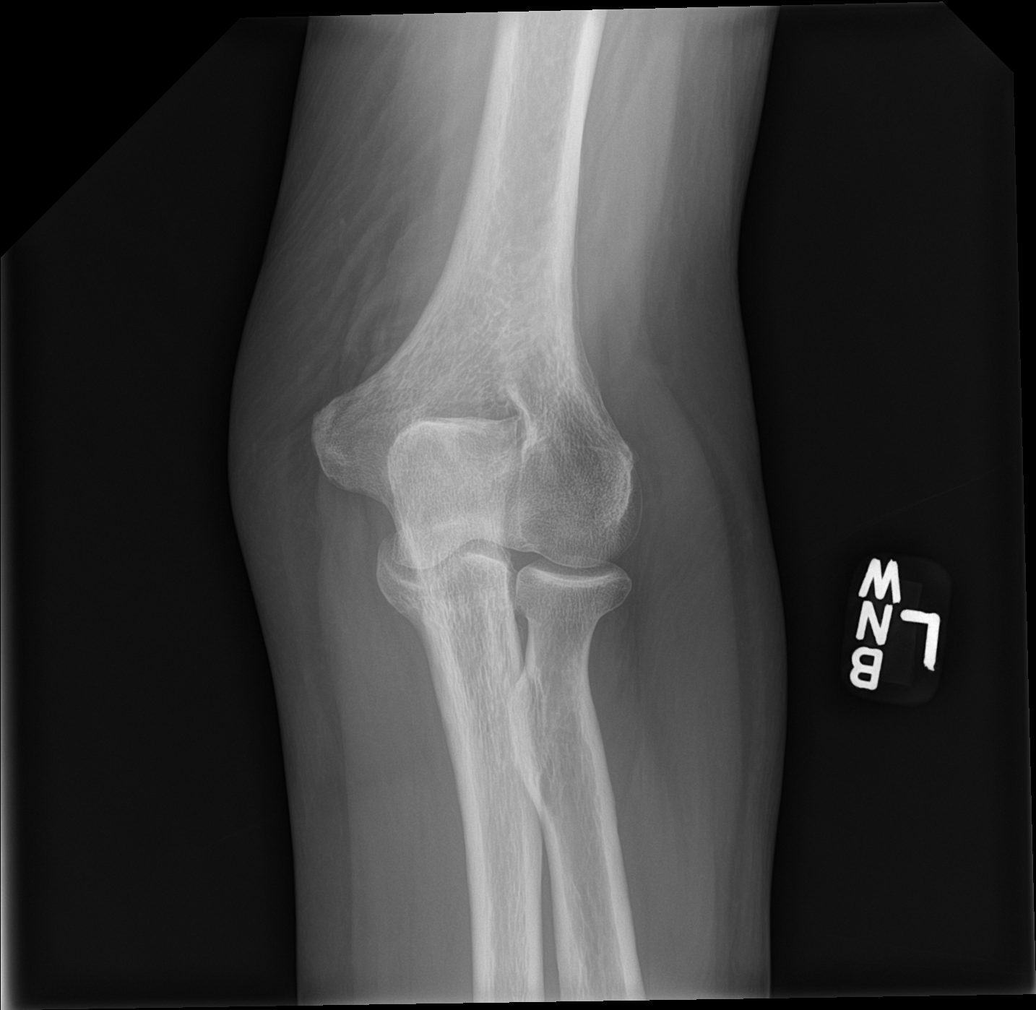

[elbow obl (1 of 2)]
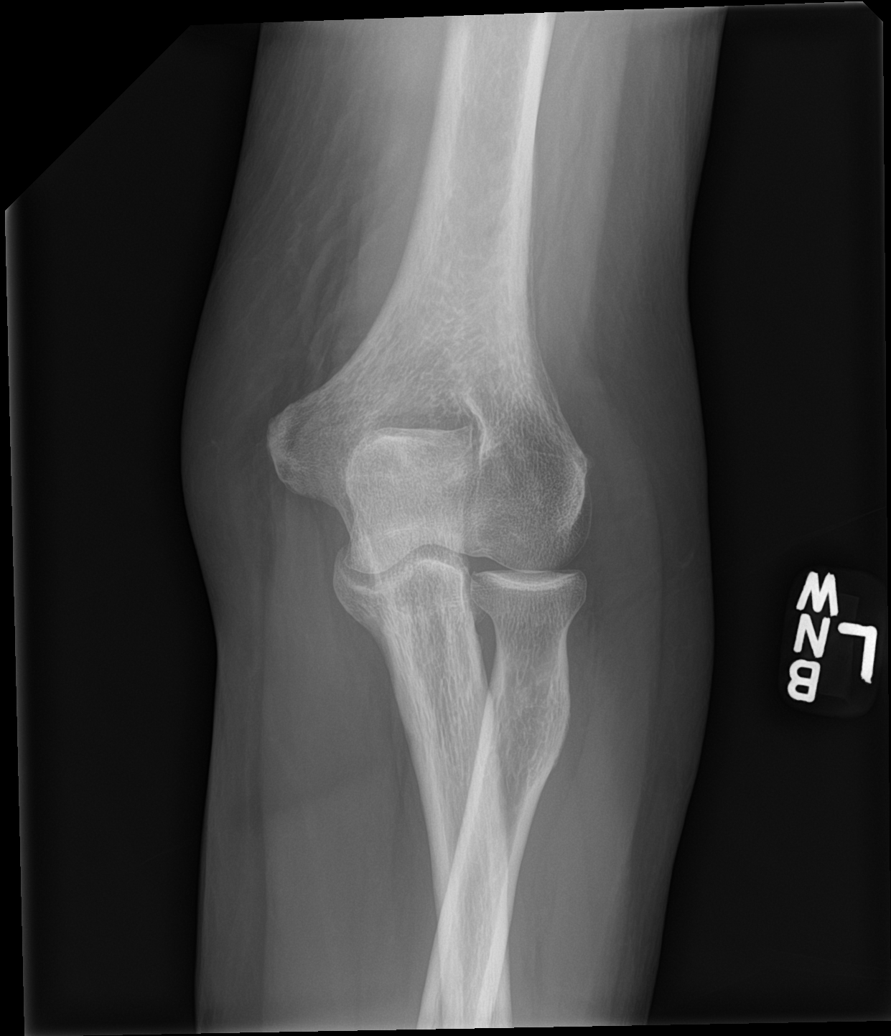

[elbow obl (2 of 2)]
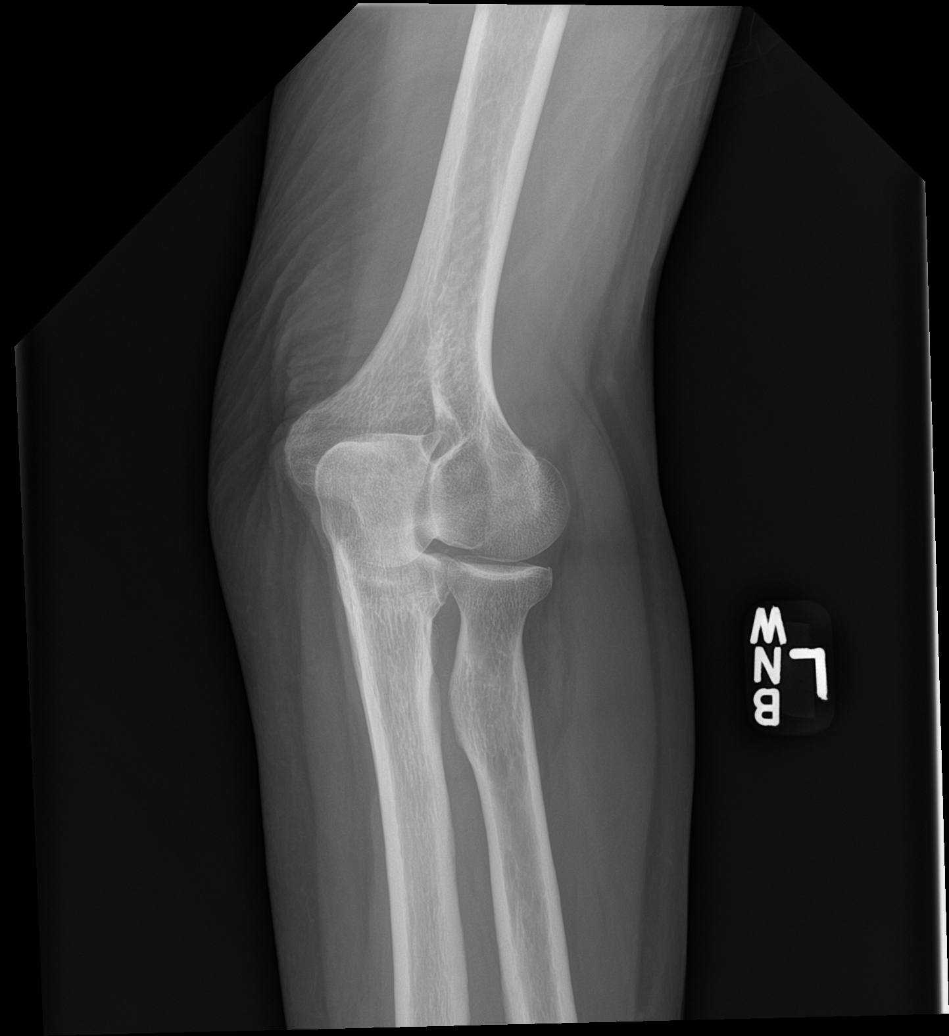

[elbow lat]
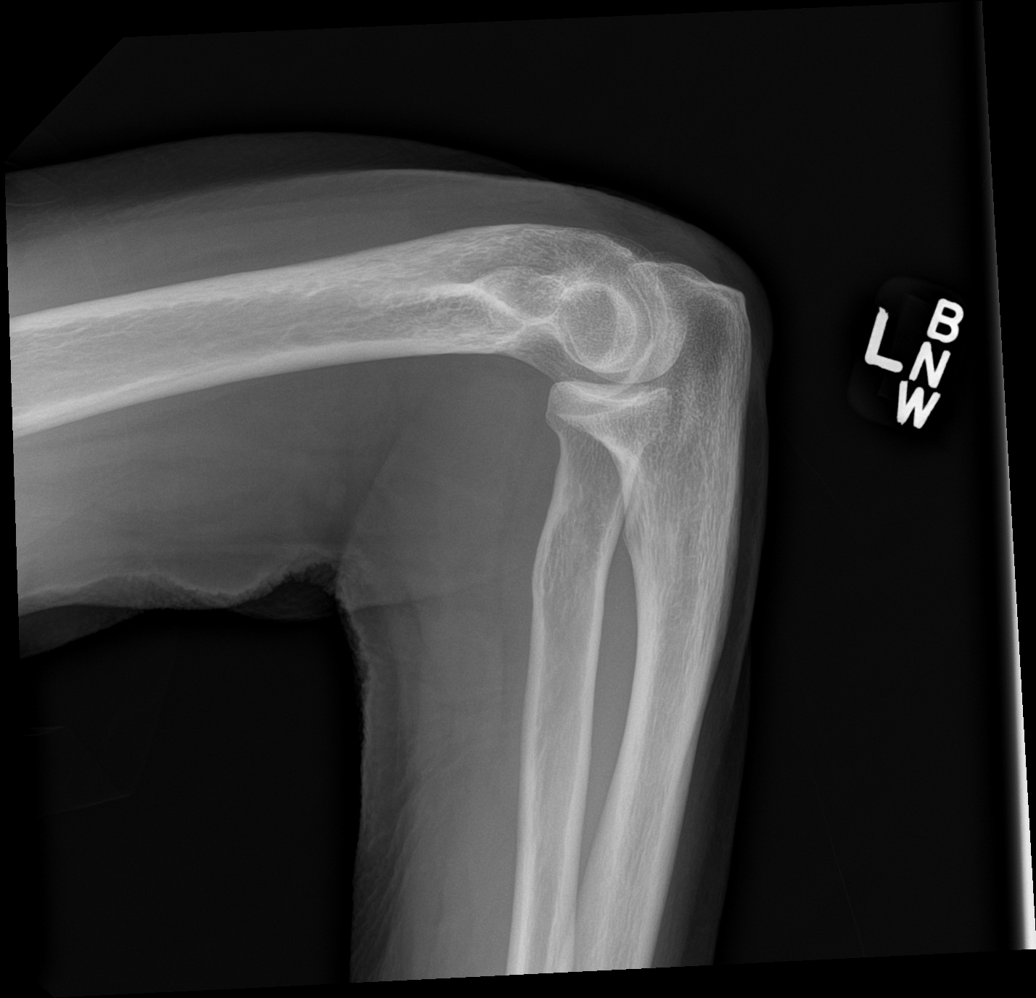

[4 of 4 positions shown; findings below may reference images not displayed]

FINDINGS: There is a very small cortical defect seen involving the left radial
head. This appears to be chronic versus congenital in nature. There
is no evidence of dislocation. There is no evidence of arthropathy.
Soft tissues are unremarkable.
IMPRESSION: Very small, chronic versus congenital-appearing cortical defect
involving the left radial head. CT correlation is recommended if
acute fracture remains of clinical concern.

## 2020-09-12 ENCOUNTER — Ambulatory Visit: Payer: Medicare FFS | Admitting: Podiatry

## 2020-09-12 ENCOUNTER — Other Ambulatory Visit: Payer: Self-pay

## 2020-09-12 DIAGNOSIS — M21961 Unspecified acquired deformity of right lower leg: Secondary | ICD-10-CM

## 2020-09-12 DIAGNOSIS — L84 Corns and callosities: Secondary | ICD-10-CM

## 2020-09-12 DIAGNOSIS — M216X9 Other acquired deformities of unspecified foot: Secondary | ICD-10-CM | POA: Diagnosis not present

## 2020-09-12 DIAGNOSIS — M19071 Primary osteoarthritis, right ankle and foot: Secondary | ICD-10-CM

## 2020-09-12 NOTE — Patient Instructions (Signed)

## 2020-09-17 NOTE — Progress Notes (Signed)
Subjective: 65 year old female presents the office today for surgical consultation as well as discuss CT scan results.  She has pain mostly in the bunion as well as the ball of her foot resulting in painful calluses.  She has tried shoe modifications, offloading significant provement.  She wants to discuss surgical intervention at this point. Denies any systemic complaints such as fevers, chills, nausea, vomiting. No acute changes since last appointment, and no other complaints at this time.   Objective: AAO x3, NAD DP/PT pulses palpable bilaterally, CRT less than 3 seconds Significant bunion is present with decreasing to motion first MPJ with crepitation.  There is prominence of metatarsal heads plantarly with atrophy of the fat pad.  Rigid hammertoe contractures present of digits 3 through 5.  The second toe has previously been fused.  No other areas of discomfort identified.  Otherwise MMT 5/5. No pain with calf compression, swelling, warmth, erythema  Assessment: 65 year old female with bunion, arthritis first MPJ with metatarsalgia, hammertoes  Plan: -All treatment options discussed with the patient including all alternatives, risks, complications.  -I reviewed the CT scan with her as well as the x-rays.  Also I previously discussed this case with the other physicians in the group at our last case review. -I discussed both conservative as well as surgical options also proceed with surgery.  Discussed various options.  We will plan for first MPJ arthrodesis with as well as metatarsal head resections with hammertoe repair digits 3 through 5. -The incision placement as well as the postoperative course was discussed with the patient. I discussed risks of the surgery which include, but not limited to, infection, bleeding, pain, swelling, need for further surgery, delayed or nonhealing, painful or ugly scar, numbness or sensation changes, over/under correction, recurrence, transfer lesions, further  deformity, hardware failure, DVT/PE, loss of toe/foot. Patient understands these risks and wishes to proceed with surgery. The surgical consent was reviewed with the patient all 3 pages were signed. No promises or guarantees were given to the outcome of the procedure. All questions were answered to the best of my ability. Before the surgery the patient was encouraged to call the office if there is any further questions. The surgery will be performed at the [facility] on an outpatient basis. -Patient encouraged to call the office with any questions, concerns, change in symptoms.   Vivi Barrack DPM

## 2020-10-03 ENCOUNTER — Telehealth: Payer: Self-pay | Admitting: Urology

## 2020-10-03 NOTE — Telephone Encounter (Signed)
DOS - 10/23/20  OSTECT. COMP MET. HEAD 2-5 RIGHT --- 28112 HAMMERTOE REPAIR 3-5 RIGHT --- 28285 HALLUX MPJ FUSION RIGHT ---88416  PER [employer] ([employer]) This is a [employer] [place] private [place]-for-service [place]FFS) [place].  Pre[place] is not required for [place]. If you continue with this request, it will be processed as an advanced coverage determination and may be denied.

## 2020-10-23 ENCOUNTER — Encounter: Payer: Self-pay | Admitting: Podiatry

## 2020-10-23 ENCOUNTER — Other Ambulatory Visit: Payer: Self-pay | Admitting: Podiatry

## 2020-10-23 ENCOUNTER — Telehealth: Payer: Self-pay | Admitting: *Deleted

## 2020-10-23 DIAGNOSIS — M2041 Other hammer toe(s) (acquired), right foot: Secondary | ICD-10-CM

## 2020-10-23 DIAGNOSIS — M13871 Other specified arthritis, right ankle and foot: Secondary | ICD-10-CM

## 2020-10-23 DIAGNOSIS — M216X1 Other acquired deformities of right foot: Secondary | ICD-10-CM

## 2020-10-23 MED ORDER — PROMETHAZINE HCL 25 MG PO TABS: 25.0000 mg | ORAL_TABLET | Freq: Three times a day (TID) | ORAL | 0 refills | Status: DC | PRN

## 2020-10-23 MED ORDER — OXYCODONE-ACETAMINOPHEN 10-325 MG PO TABS: 1.0000 | ORAL_TABLET | ORAL | 0 refills | Status: DC | PRN

## 2020-10-23 MED ORDER — ENOXAPARIN SODIUM 40 MG/0.4ML ~~LOC~~ SOLN: 40.0000 mg | SUBCUTANEOUS | 0 refills | Status: DC

## 2020-10-23 MED ORDER — RIVAROXABAN 10 MG PO TABS: 10.0000 mg | ORAL_TABLET | Freq: Every day | ORAL | 0 refills | Status: DC

## 2020-10-23 MED ORDER — CEPHALEXIN 500 MG PO CAPS: 500.0000 mg | ORAL_CAPSULE | Freq: Three times a day (TID) | ORAL | 0 refills | Status: DC

## 2020-10-23 NOTE — Telephone Encounter (Signed)
sent 

## 2020-10-23 NOTE — Progress Notes (Signed)
After discussed with patient and she wants Lovenox. Sent Lovenox and canceled xarelto with the pharamcy.

## 2020-10-23 NOTE — Progress Notes (Signed)
Give the length of surgery, immobilization I discussed DVT prophylaxis with the patient's husband. I feel that it is best to do DVT prophylaxis to help prevent DVT/PE. Discussed options and he wants to proceed with oral anticoagulation. Prescribed Xeralto x 12 days. Discussed this is an off label use of this medication as it is indicated for DVT prophylaxis after hip/knee surgery.

## 2020-10-23 NOTE — Telephone Encounter (Signed)
Called and left Vmessage for patient that the medication has been sent to pharmacy on file,

## 2020-10-23 NOTE — Telephone Encounter (Signed)
Patient is calling because her insurance will not cover the injection to prevent blood clots and wanted to let physician know that she will have to go with the pills. Please send to pharmacy on file.

## 2020-10-28 ENCOUNTER — Other Ambulatory Visit: Payer: Self-pay

## 2020-10-28 ENCOUNTER — Ambulatory Visit (INDEPENDENT_AMBULATORY_CARE_PROVIDER_SITE_OTHER): Payer: Medicare FFS | Admitting: Podiatry

## 2020-10-28 ENCOUNTER — Encounter: Payer: Self-pay | Admitting: Podiatry

## 2020-10-28 ENCOUNTER — Ambulatory Visit (INDEPENDENT_AMBULATORY_CARE_PROVIDER_SITE_OTHER): Payer: Medicare FFS

## 2020-10-28 VITALS — Temp 96.4°F | Resp 14

## 2020-10-28 DIAGNOSIS — Z9889 Other specified postprocedural states: Secondary | ICD-10-CM

## 2020-10-28 DIAGNOSIS — M21961 Unspecified acquired deformity of right lower leg: Secondary | ICD-10-CM

## 2020-10-28 DIAGNOSIS — M19071 Primary osteoarthritis, right ankle and foot: Secondary | ICD-10-CM

## 2020-10-28 DIAGNOSIS — M216X1 Other acquired deformities of right foot: Secondary | ICD-10-CM | POA: Diagnosis not present

## 2020-10-28 DIAGNOSIS — M216X9 Other acquired deformities of unspecified foot: Secondary | ICD-10-CM

## 2020-10-28 DIAGNOSIS — L84 Corns and callosities: Secondary | ICD-10-CM

## 2020-10-28 MED ORDER — OXYCODONE-ACETAMINOPHEN 10-325 MG PO TABS: 1.0000 | ORAL_TABLET | ORAL | 0 refills | Status: DC | PRN

## 2020-10-28 NOTE — Progress Notes (Signed)
Subjective: Kathy Coleman is a 65 y.o. is seen today in office s/p right foot first MPJ arthrodesis, pan metatarsal head resection with hammertoe repair of 3, 4, and 5 preformed on 10/23/2020.  She states that the pain is better controlled she still taking pain medication asking for refill.  She is been in the cam boot, nonweightbearing.  She is also on Xarelto as a precaution for DVT prophylaxis.  Denies any systemic complaints such as fevers, chills, nausea, vomiting. No calf pain, chest pain, shortness of breath.   Objective: General: No acute distress, AAOx3  DP/PT pulses palpable 2/4, CRT < 3 sec to all digits.  Protective sensation intact. Motor function intact.  Left foot: Incision is well coapted without any evidence of dehiscence.  There is mild edema to the foot.  Third toe has some bruising, red discoloration but is more for inflammation as opposed to infection but there is no purulence identified.  No ascending cellulitis or warmth of the foot.  K wire intact the third and fourth toes without any drainage or signs of infection. No other areas of tenderness to bilateral lower extremities.  No other open lesions or pre-ulcerative lesions.  No pain with calf compression, swelling, warmth, erythema.   Assessment and Plan:  Status post right foot surgery, doing well with no complications   -Treatment options discussed including all alternatives, risks, and complications -X-rays obtained reviewed.  Status post first MPJ arthrodesis with hardware intact without any complicating factors.  Status post metatarsal head resection as well as well as hammertoe repair. -And clean the incisions today.  Antibiotic ointment and a dressing applied.  Keep the dressing clean, dry, intact -Continue cam boot, nonweightbearing -Finish course of Xarelto for DVT prophylaxis. -Ice/elevation -Pain medication as needed. -Monitor for any clinical signs or symptoms of infection and DVT/PE and directed to call the  office immediately should any occur or go to the ER. -Follow-up as scheduled or sooner if any problems arise. In the meantime, encouraged to call the office with any questions, concerns, change in symptoms.   Ovid Curd, DPM

## 2020-11-07 ENCOUNTER — Ambulatory Visit (INDEPENDENT_AMBULATORY_CARE_PROVIDER_SITE_OTHER): Payer: Medicare FFS | Admitting: Podiatry

## 2020-11-07 ENCOUNTER — Other Ambulatory Visit: Payer: Self-pay

## 2020-11-07 DIAGNOSIS — M216X9 Other acquired deformities of unspecified foot: Secondary | ICD-10-CM

## 2020-11-07 DIAGNOSIS — M19071 Primary osteoarthritis, right ankle and foot: Secondary | ICD-10-CM

## 2020-11-07 DIAGNOSIS — L84 Corns and callosities: Secondary | ICD-10-CM

## 2020-11-12 NOTE — Progress Notes (Signed)
Subjective: Kathy Coleman is a 65 y.o. is seen today in office s/p right foot first MPJ arthrodesis, pan metatarsal head resection with hammertoe repair of 3, 4, and 5 preformed on 10/23/2020.  States that she is doing better and the pain is more improved.  She presents here for possible suture removal.  The majority pain she gets is at nighttime.  She has no other concerns today.  Denies any fevers, chills, nausea, vomiting.  No calf pain, chest pain, shortness of breath.   Objective: General: No acute distress, AAOx3  DP/PT pulses palpable 2/4, CRT < 3 sec to all digits.  Protective sensation intact. Motor function intact.  Left foot: Incision is well coapted without any evidence of dehiscence.  Sutures are intact.  There was a clear blister at the distal aspect of the second toe which was draining clear fluid but no pus.  There is decreased edema there is no erythema or warmth.  No ascending cellulitis.  No evidence of hematoma or fluid collection. No other areas of tenderness to bilateral lower extremities.  No other open lesions or pre-ulcerative lesions.  No pain with calf compression, swelling, warmth, erythema.   Assessment and Plan:  Status post right foot surgery, doing well with no complications   -Treatment options discussed including all alternatives, risks, and complications -Today remove The sutures with any complications and the incision remained well coapted.  Antibiotic ointment and dressing applied.  Keep the dressing clean, dry, intact. -Remain nonweightbearing cam boot -Ice/elevation -Pain medication as needed. -Monitor for any clinical signs or symptoms of infection and DVT/PE and directed to call the office immediately should any occur or go to the ER. -Follow-up as scheduled or sooner if any problems arise. In the meantime, encouraged to call the office with any questions, concerns, change in symptoms.   Ovid Curd, DPM

## 2020-11-14 ENCOUNTER — Other Ambulatory Visit: Payer: Self-pay

## 2020-11-14 ENCOUNTER — Ambulatory Visit (INDEPENDENT_AMBULATORY_CARE_PROVIDER_SITE_OTHER): Payer: Medicare FFS | Admitting: Podiatry

## 2020-11-14 DIAGNOSIS — M21611 Bunion of right foot: Secondary | ICD-10-CM

## 2020-11-14 DIAGNOSIS — L84 Corns and callosities: Secondary | ICD-10-CM

## 2020-11-14 DIAGNOSIS — M19071 Primary osteoarthritis, right ankle and foot: Secondary | ICD-10-CM

## 2020-11-14 DIAGNOSIS — M21612 Bunion of left foot: Secondary | ICD-10-CM

## 2020-11-14 DIAGNOSIS — M216X9 Other acquired deformities of unspecified foot: Secondary | ICD-10-CM

## 2020-11-14 DIAGNOSIS — Z9889 Other specified postprocedural states: Secondary | ICD-10-CM

## 2020-11-14 MED ORDER — OXYCODONE-ACETAMINOPHEN 5-325 MG PO TABS: 1.0000 | ORAL_TABLET | Freq: Three times a day (TID) | ORAL | 0 refills | Status: DC | PRN

## 2020-11-16 NOTE — Progress Notes (Signed)
Subjective: Kathy Coleman is a 65 y.o. is seen today in office s/p right foot first MPJ arthrodesis, pan metatarsal head resection with hammertoe repair of 3, 4, and 5 preformed on 10/23/2020.  Presents today remove the remainder of the sutures.  Otherwise she states that she been doing well and the pain is improved. Denies any fevers, chills, nausea, vomiting.  No calf pain, chest pain, shortness of breath.   Objective: General: No acute distress, AAOx3  DP/PT pulses palpable 2/4, CRT < 3 sec to all digits.  Protective sensation intact. Motor function intact.  Left foot: Incision is well coapted without any evidence of dehiscence.  The sutures are intact.  The blister present second toe is resolved.  K wire intact the third and fourth digits without any drainage or signs of infection.  Decreased edema.  No significant tenderness palpation.  There is no signs of infection noted today. No other open lesions or pre-ulcerative lesions.  No pain with calf compression, swelling, warmth, erythema.   Assessment and Plan:  Status post right foot surgery, doing well with no complications   -Treatment options discussed including all alternatives, risks, and complications -Remove remainder the sutures.  Antibiotic ointment and dressing applied.  She can start to wash the foot with soap and water and dry thoroughly and apply a similar bandage. -Ice/elevation -Pain medication as needed. -Monitor for any clinical signs or symptoms of infection and DVT/PE and directed to call the office immediately should any occur or go to the ER. -Follow-up as scheduled or sooner if any problems arise. In the meantime, encouraged to call the office with any questions, concerns, change in symptoms.   *X-ray next appointment  Ovid Curd, DPM

## 2020-11-21 ENCOUNTER — Encounter: Payer: Medicare FFS | Admitting: Podiatry

## 2020-12-10 ENCOUNTER — Ambulatory Visit (INDEPENDENT_AMBULATORY_CARE_PROVIDER_SITE_OTHER): Payer: Medicare FFS | Admitting: Podiatry

## 2020-12-10 ENCOUNTER — Other Ambulatory Visit: Payer: Self-pay

## 2020-12-10 ENCOUNTER — Encounter: Payer: Self-pay | Admitting: Podiatry

## 2020-12-10 ENCOUNTER — Ambulatory Visit (INDEPENDENT_AMBULATORY_CARE_PROVIDER_SITE_OTHER): Payer: Medicare FFS

## 2020-12-10 DIAGNOSIS — M216X1 Other acquired deformities of right foot: Secondary | ICD-10-CM

## 2020-12-10 DIAGNOSIS — M19071 Primary osteoarthritis, right ankle and foot: Secondary | ICD-10-CM

## 2020-12-10 DIAGNOSIS — M216X9 Other acquired deformities of unspecified foot: Secondary | ICD-10-CM

## 2020-12-10 DIAGNOSIS — Z9889 Other specified postprocedural states: Secondary | ICD-10-CM

## 2020-12-10 DIAGNOSIS — L84 Corns and callosities: Secondary | ICD-10-CM

## 2020-12-14 NOTE — Progress Notes (Signed)
Subjective: Kathy Coleman is a 65 y.o. is seen today in office s/p right foot first MPJ arthrodesis, pan metatarsal head resection with hammertoe repair of 3, 4, and 5 preformed on 10/23/2020.  She presents today for K wire removed.  She states that she is doing well and not having significant pain but she is ready to have the pins taken out.  She still been nonweightbearing.  No recent injury or falls that she reports.  Denies any fevers, chills, nausea, vomiting.  No calf pain, chest pain, shortness of breath.   Objective: General: No acute distress, AAOx3  DP/PT pulses palpable 2/4, CRT < 3 sec to all digits.  Protective sensation intact. Motor function intact.  Left foot: Incision is well coapted without any evidence of dehiscence and scars are well formed.  K wire is intact in the third and fourth toes with any drainage or signs of infection.  There is still mild edema to the foot that overall appears to be improved.  No erythema or warmth.  No significant discomfort to palpation at surgical sites.   No pain with calf compression, swelling, warmth, erythema.   Assessment and Plan:  Status post right foot surgery, doing well with no complications   -Treatment options discussed including all alternatives, risks, and complications -Pins were cleaned and they were removed in toto without complications.  I cleaned the areas and antibiotic ointment and dressing were applied. -X-rays were obtained afterwards.  Hardware was removed.  Status post first MPJ arthrodesis with increased consolidation.  Status post metatarsal head resections.  No evidence of a complicating factors otherwise. -At this point she can start to transition to partial weightbearing as tolerated in the cam boot.  Discussed continue to ice and elevate.  Dispensed compression anklet that she can use or use Ace bandage to help with any postoperative edema. -Monitor for any clinical signs or symptoms of infection and directed to call  the office immediately should any occur or go to the ER.  Return in about 2 weeks (around 12/24/2020).  Vivi Barrack DPM

## 2020-12-16 DIAGNOSIS — M1990 Unspecified osteoarthritis, unspecified site: Secondary | ICD-10-CM | POA: Insufficient documentation

## 2020-12-16 DIAGNOSIS — E785 Hyperlipidemia, unspecified: Secondary | ICD-10-CM | POA: Insufficient documentation

## 2020-12-16 DIAGNOSIS — M4800 Spinal stenosis, site unspecified: Secondary | ICD-10-CM | POA: Insufficient documentation

## 2020-12-16 DIAGNOSIS — R000 Tachycardia, unspecified: Secondary | ICD-10-CM | POA: Insufficient documentation

## 2020-12-16 DIAGNOSIS — G629 Polyneuropathy, unspecified: Secondary | ICD-10-CM | POA: Insufficient documentation

## 2020-12-23 ENCOUNTER — Encounter: Payer: Self-pay | Admitting: Podiatry

## 2020-12-23 ENCOUNTER — Ambulatory Visit (INDEPENDENT_AMBULATORY_CARE_PROVIDER_SITE_OTHER): Payer: Medicare FFS | Admitting: Podiatry

## 2020-12-23 ENCOUNTER — Other Ambulatory Visit: Payer: Self-pay

## 2020-12-23 DIAGNOSIS — M21611 Bunion of right foot: Secondary | ICD-10-CM

## 2020-12-23 DIAGNOSIS — M21612 Bunion of left foot: Secondary | ICD-10-CM

## 2020-12-23 DIAGNOSIS — M19071 Primary osteoarthritis, right ankle and foot: Secondary | ICD-10-CM

## 2020-12-23 DIAGNOSIS — Z9889 Other specified postprocedural states: Secondary | ICD-10-CM

## 2020-12-23 DIAGNOSIS — M216X9 Other acquired deformities of unspecified foot: Secondary | ICD-10-CM

## 2020-12-25 NOTE — Progress Notes (Signed)
Subjective: Kathy Coleman is a 65 y.o. is seen today in office s/p right foot first MPJ arthrodesis, pan metatarsal head resection with hammertoe repair of 3, 4, and 5 preformed on 10/23/2020.  She is been in the cam boot but she is ready back to regular shoe.  Still some swelling she reports but no significant pain not taking any pain medication. Denies any fevers, chills, nausea, vomiting.  No calf pain, chest pain, shortness of breath.   Objective: General: No acute distress, AAOx3  DP/PT pulses palpable 2/4, CRT < 3 sec to all digits.  Protective sensation intact. Motor function intact.  Left foot: Incision is well coapted without any evidence of dehiscence and scars are well formed.  The toes are in rectus position.  There is still mild edema present, there is no erythema or warmth.  The edema does appear to be somewhat improved compared to last appointment but does remain.  No significant tenderness palpation at surgical sites.  Arthrodesis site is stable. No pain with calf compression, swelling, warmth, erythema.   Assessment and Plan:  Status post right foot surgery, doing well with no complications   -Treatment options discussed including all alternatives, risks, and complications -At this point she can start transition to a regular shoe as tolerated.  Continue to ice and elevate.  Compression for edema control.  *X-ray next appointment  Return in about 3 weeks (around 01/13/2021).  Vivi Barrack DPM

## 2021-01-20 ENCOUNTER — Ambulatory Visit (INDEPENDENT_AMBULATORY_CARE_PROVIDER_SITE_OTHER): Payer: Medicare FFS | Admitting: Podiatry

## 2021-01-20 ENCOUNTER — Other Ambulatory Visit: Payer: Self-pay

## 2021-01-20 ENCOUNTER — Ambulatory Visit (INDEPENDENT_AMBULATORY_CARE_PROVIDER_SITE_OTHER): Payer: Medicare FFS

## 2021-01-20 VITALS — BP 132/71 | Temp 97.6°F

## 2021-01-20 DIAGNOSIS — M216X9 Other acquired deformities of unspecified foot: Secondary | ICD-10-CM

## 2021-01-20 DIAGNOSIS — Z9889 Other specified postprocedural states: Secondary | ICD-10-CM

## 2021-01-20 DIAGNOSIS — B351 Tinea unguium: Secondary | ICD-10-CM

## 2021-01-20 DIAGNOSIS — M19071 Primary osteoarthritis, right ankle and foot: Secondary | ICD-10-CM

## 2021-01-20 DIAGNOSIS — M216X1 Other acquired deformities of right foot: Secondary | ICD-10-CM | POA: Diagnosis not present

## 2021-01-22 NOTE — Progress Notes (Signed)
Subjective: Kathy Coleman is a 65 y.o. is seen today in office s/p right foot first MPJ arthrodesis, pan metatarsal head resection with hammertoe repair of 3, 4, and 5 preformed on 10/23/2020.  States that she has been doing well.  The swelling is improved.  She is back to wearing regular shoe.  Some occasional discomfort but not consistent.  She states that she has been out working in the yard more been more active without significant discomfort.  She has no new concerns today otherwise.  No recent injury or changes.   Objective: General: No acute distress, AAOx3  DP/PT pulses palpable 2/4, CRT < 3 sec to all digits.  Protective sensation intact. Motor function intact.  Left foot: Incision is well coapted without any evidence of dehiscence and scars are well formed.  There is decreased edema and there is no erythema or warmth.  There is no significant tenderness palpation at surgical sites.  Arthrodesis site appears to be stable.  No recurrence of the hyperkeratotic lesions. No pain with calf compression, swelling, warmth, erythema.   Assessment and Plan:  Status post right foot surgery, doing well with no complications   -Treatment options discussed including all alternatives, risks, and complications -Reviewed x-rays obtained and reviewed.  Status post pan metatarsal head resection.  Arthrodesis of the first PJ with increased consolidation present. -Continue with supportive shoe gear. -Gradual increase activity level. -Continue with compression as well as ice to help with any postoperative edema.  Return in about 6 weeks (around 03/03/2021).  X-ray next appointment  Vivi Barrack DPM

## 2021-02-05 IMAGING — DX DG THORACIC SPINE 3V
3 series · 3 of 3 positions shown · non-contrast
Comparison: 03/14/2019

CLINICAL DATA: Fall 2 months ago.  Prior surgery.

EXAM:
THORACIC SPINE - 3 VIEWS

[t-spine ap]
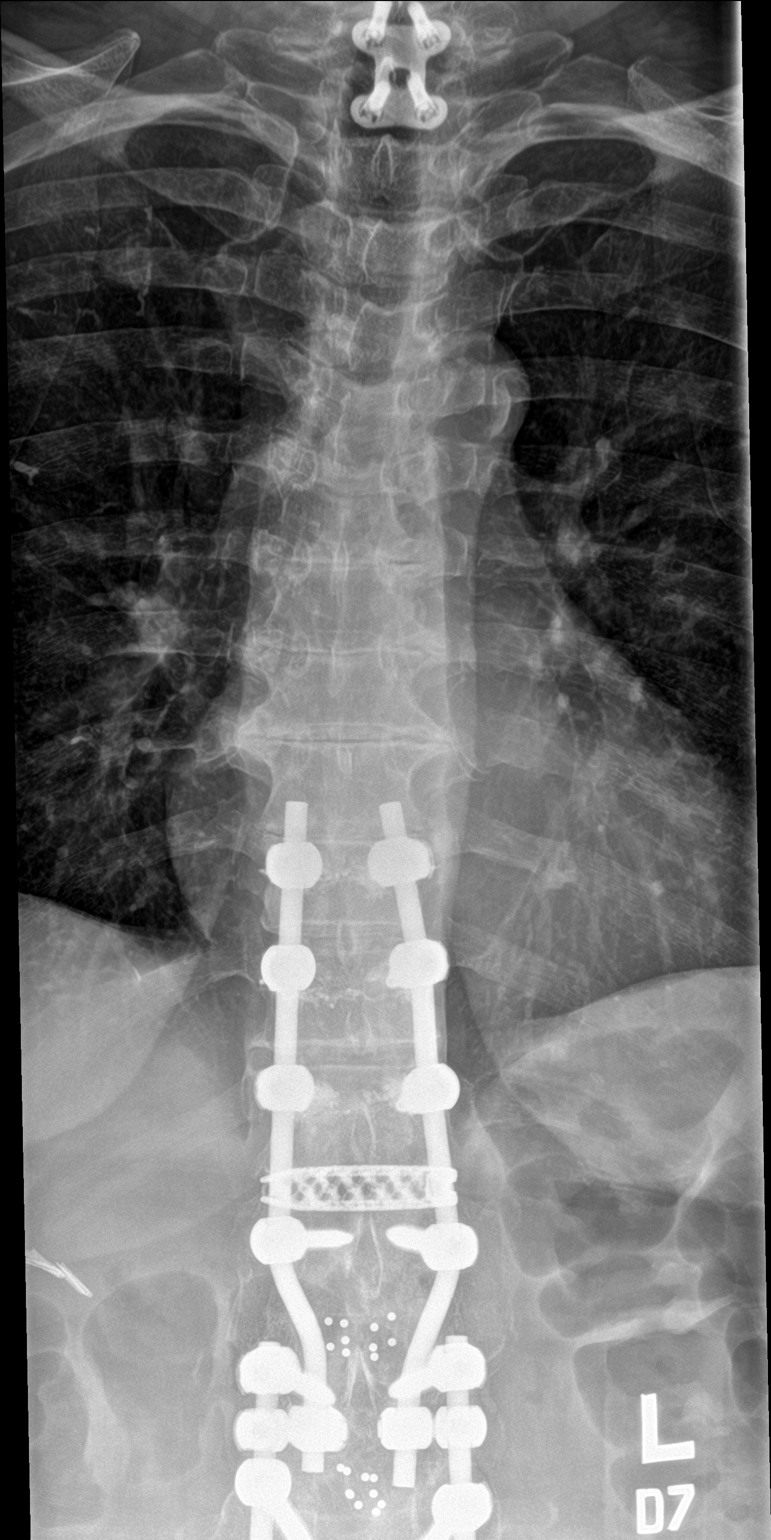

[t-spine lat]
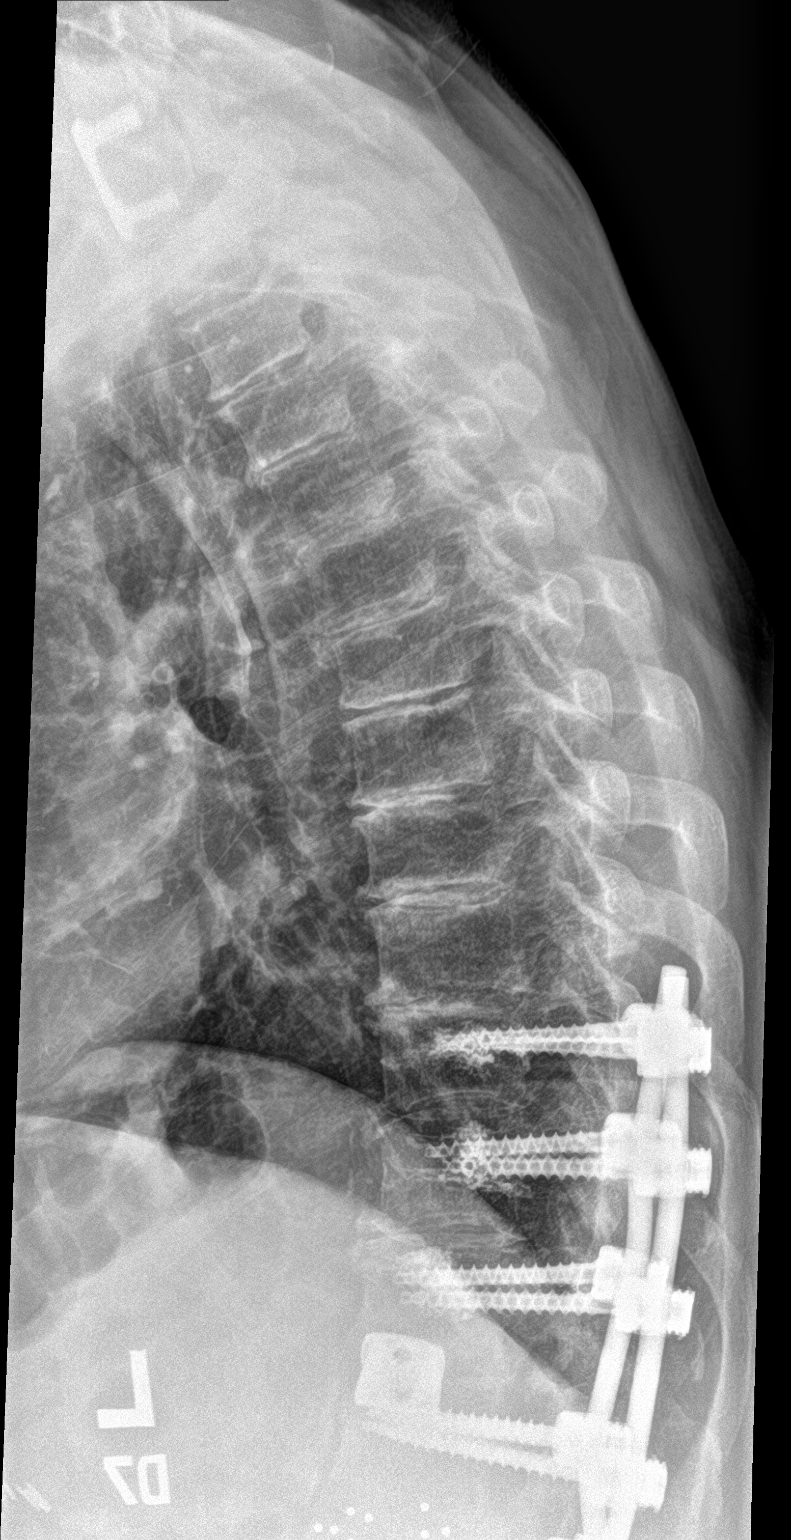

[t-spine swimmers]
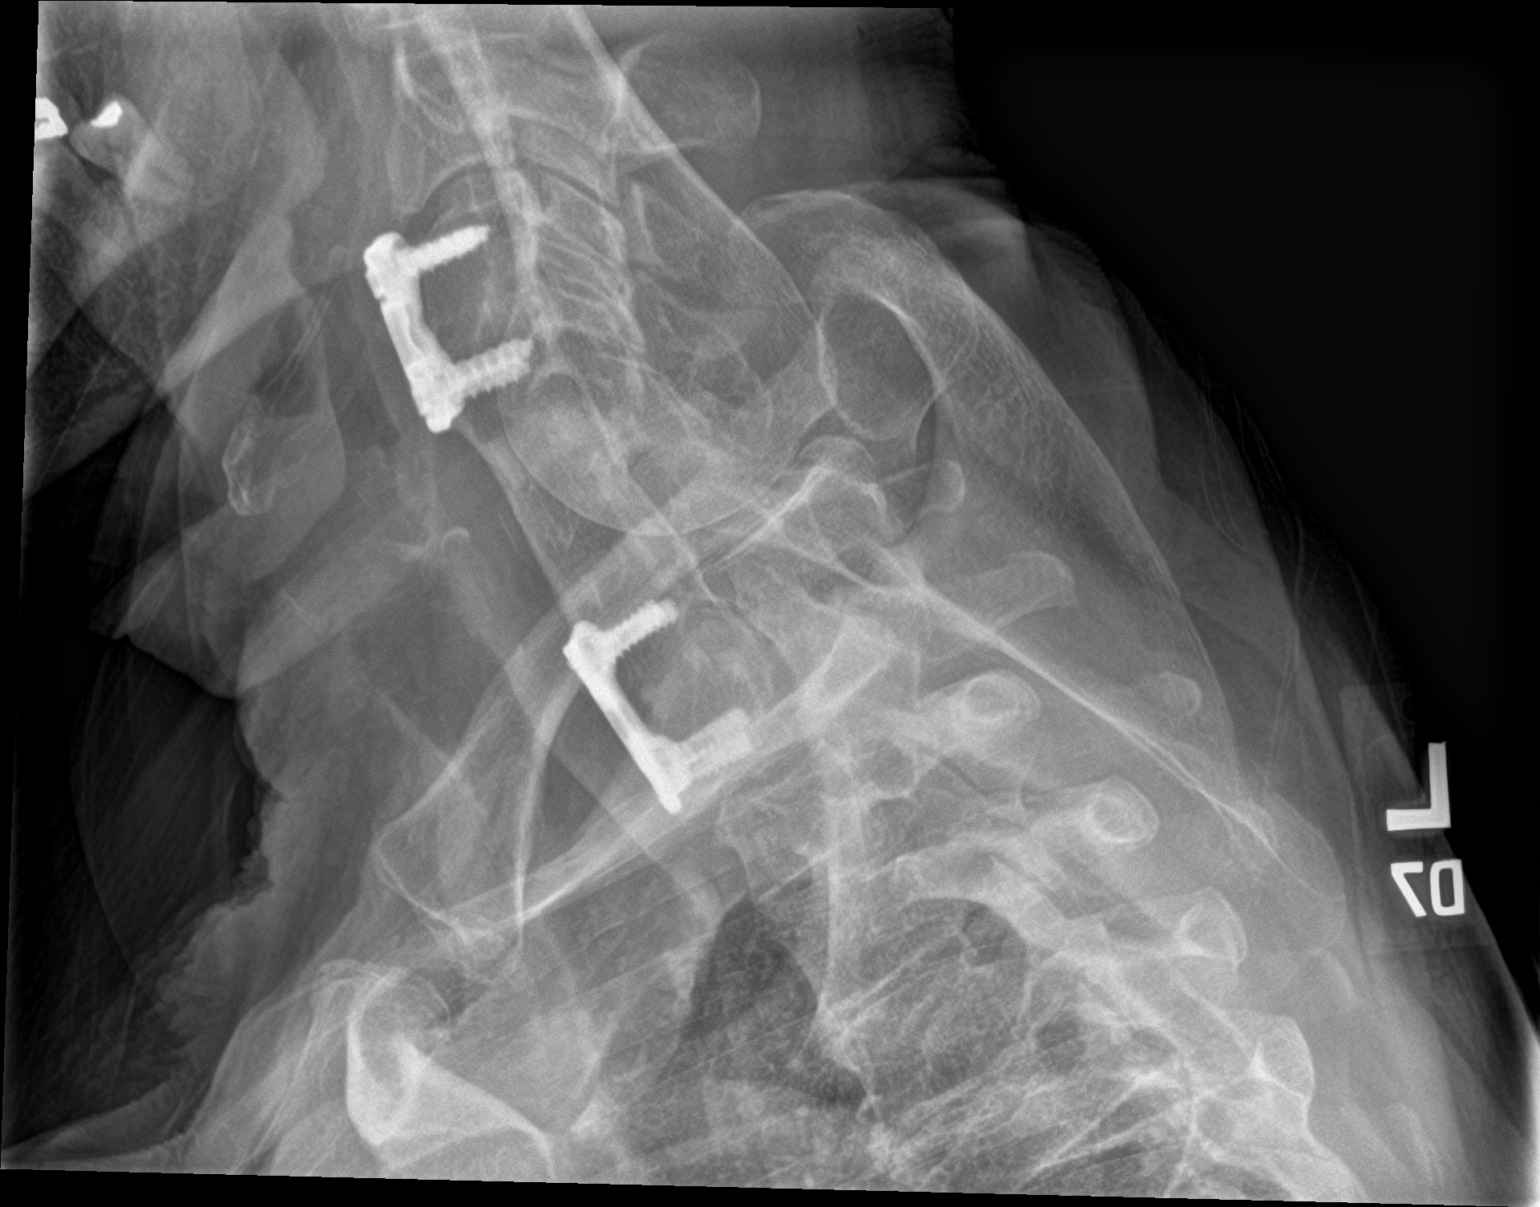

[3 of 3 positions shown; findings below may reference images not displayed]

FINDINGS: Atherosclerosis in the transverse aorta. Normal paraspinous
contours. Lower thoracic spine fixation. No acute hardware
complication identified. There is also lower cervical spine
fixation. Thoracic spondylosis, with maintenance of vertebral body
height.
IMPRESSION: Thoracic spondylosis and cervical/thoracic surgical changes, without
acute finding.

Aortic Atherosclerosis ([id number]).

## 2021-03-04 ENCOUNTER — Encounter: Payer: Medicare FFS | Admitting: Podiatry

## 2021-03-17 ENCOUNTER — Ambulatory Visit (INDEPENDENT_AMBULATORY_CARE_PROVIDER_SITE_OTHER): Payer: Medicare FFS | Admitting: Podiatry

## 2021-03-17 ENCOUNTER — Ambulatory Visit (INDEPENDENT_AMBULATORY_CARE_PROVIDER_SITE_OTHER): Payer: Medicare FFS

## 2021-03-17 ENCOUNTER — Other Ambulatory Visit: Payer: Self-pay

## 2021-03-17 ENCOUNTER — Encounter: Payer: Self-pay | Admitting: Podiatry

## 2021-03-17 DIAGNOSIS — M19071 Primary osteoarthritis, right ankle and foot: Secondary | ICD-10-CM

## 2021-03-17 DIAGNOSIS — M216X9 Other acquired deformities of unspecified foot: Secondary | ICD-10-CM | POA: Diagnosis not present

## 2021-03-17 DIAGNOSIS — M216X1 Other acquired deformities of right foot: Secondary | ICD-10-CM

## 2021-03-17 DIAGNOSIS — B351 Tinea unguium: Secondary | ICD-10-CM

## 2021-03-20 ENCOUNTER — Other Ambulatory Visit: Payer: Self-pay | Admitting: Neurological Surgery

## 2021-03-20 ENCOUNTER — Other Ambulatory Visit (HOSPITAL_COMMUNITY): Payer: Self-pay | Admitting: Neurological Surgery

## 2021-03-20 DIAGNOSIS — M546 Pain in thoracic spine: Secondary | ICD-10-CM

## 2021-03-21 NOTE — Progress Notes (Signed)
Subjective: Kathy Coleman is a 65 y.o. is seen today in office s/p right foot first MPJ arthrodesis, pan metatarsal head resection with hammertoe repair of 3, 4, and 5 preformed on 10/23/2020.  She said overall she is doing better without having any significant pain.  She said it feels much better than it did prior to surgery.  Some residual swelling but seems to be improving as well.  Gets an occasional aching sensation but she is able to do her regular activities without any limitations.    She has been going to her left big toe she wants to come back to have this removed.  She has been keeping vitamin E oil on the thigh to help with the nails which has been helpful.  Objective: General: No acute distress, AAOx3  DP/PT pulses palpable 2/4, CRT < 3 sec to all digits.  Protective sensation intact. Motor function intact.  Left foot: Incision is well coapted without any evidence of dehiscence and scars are well formed.  There is decreased and trace edema without any erythema or warmth. There is no significant tenderness palpation at surgical sites.  Arthrodesis site appears to be stable.  No recurrence of the hyperkeratotic lesions. Toenail appears to be mildly hypertrophic and dystrophic without any pain.  No swelling redness or drainage. Left hallux toenail is incurvated causing some discomfort.  There is no edema, erythema or signs of infection. No pain with calf compression, swelling, warmth, erythema.   Assessment and Plan:  Status post right foot surgery, doing well with no complications; ingrown toenail left hallux  -Treatment options discussed including all alternatives, risks, and complications -Reviewed x-rays obtained and reviewed.  Status post pan metatarsal head resection.  Arthrodesis of the first PJ with increased consolidation present.  No evidence of acute fracture. -Continue with supportive shoe gear. -Continue with compression as well as ice to help with any postoperative  edema. -Continue vitamin E oil as it has been helpful for the toenails. -At this point as she is doing well without discharge from the postoperative course and she agrees to this plan.  If there is any concerns or questions let me know.  However I will see her back for ingrown toenail procedure on the left side.  No follow-ups on file.  Vivi Barrack DPM

## 2021-04-21 ENCOUNTER — Ambulatory Visit (HOSPITAL_COMMUNITY): Payer: Medicare FFS

## 2021-05-09 ENCOUNTER — Other Ambulatory Visit: Payer: Self-pay

## 2021-05-09 ENCOUNTER — Ambulatory Visit (HOSPITAL_COMMUNITY)
Admission: RE | Admit: 2021-05-09 | Discharge: 2021-05-09 | Disposition: A | Discharge status: Home / Self Care | Payer: Medicare FFS | Source: Ambulatory Visit | Attending: Neurological Surgery | Admitting: Neurological Surgery

## 2021-05-09 DIAGNOSIS — M546 Pain in thoracic spine: Secondary | ICD-10-CM | POA: Diagnosis present

## 2021-05-22 ENCOUNTER — Other Ambulatory Visit: Payer: Self-pay | Admitting: Neurological Surgery

## 2021-06-06 NOTE — Progress Notes (Signed)
Surgical Instructions    Your procedure is scheduled on 06/12/21.  Report to [facility] Main Entrance "A" at 11:00 A.M., then check in with the Admitting office.  Call this number if you have problems the morning of surgery:  [phone]   If you have any questions prior to your surgery date call [phone]: Open Monday-Friday 8am-4pm    Remember:  Do not eat after midnight the night before your surgery  You may drink clear liquids until 10:00am the morning of your surgery.   Clear liquids allowed are: Water, Non-Citrus Juices (without pulp), Carbonated Beverages, Clear Tea, Black Coffee ONLY (NO MILK, CREAM OR POWDERED CREAMER of any kind), and Gatorade    Take these medicines the morning of surgery with A SIP OF WATER  gabapentin (NEURONTIN)  HYDROcodone-acetaminophen (NORCO/VICODIN) if needed traMADol (ULTRAM) if needed  As of today, STOP taking any Aspirin (unless otherwise instructed by your surgeon) Aleve, Naproxen, Ibuprofen, Motrin, Advil, Goody's, BC's, all herbal medications, fish oil, celecoxib (CELEBREX) and all vitamins.     After your COVID test   You are not required to quarantine however you are required to wear a well-fitting mask when you are out and around people not in your household.  If your mask becomes wet or soiled, replace with a new one.  Wash your hands often with soap and water for 20 seconds or clean your hands with an alcohol-based hand sanitizer that contains at least 60% alcohol.  Do not share personal items.  Notify your provider: if you are in close contact with someone who has COVID  or if you develop a fever of 100.4 or greater, sneezing, cough, sore throat, shortness of breath or body aches.             Do not wear jewelry or makeup Do not wear lotions, powders, perfumes/colognes, or deodorant. Do not shave 48 hours prior to surgery.   Do not bring valuables to the hospital. DO Not wear nail polish, gel polish, artificial nails, or  any other type of covering on natural nails including finger and toenails. If patients have artificial nails, gel coating, etc. that need to be removed by a nail salon, please have this removed prior to surgery or surgery may need to be canceled/delayed if the surgeon/ anesthesia feels like the patient is unable to be adequately monitored.             [facility] is not responsible for any belongings or valuables.  Do NOT Smoke (Tobacco/Vaping)  24 hours prior to your procedure  If you use a CPAP at night, you may bring your mask for your overnight stay.   Contacts, glasses, hearing aids, dentures or partials may not be worn into surgery, please bring cases for these belongings   For patients admitted to the hospital, discharge time will be determined by your treatment team.   Patients discharged the day of surgery will not be allowed to drive home, and someone needs to stay with them for 24 hours.  NO VISITORS WILL BE ALLOWED IN PRE-OP WHERE PATIENTS ARE PREPPED FOR SURGERY.  ONLY 1 SUPPORT PERSON MAY BE PRESENT IN THE WAITING ROOM WHILE YOU ARE IN SURGERY.  IF YOU ARE TO BE ADMITTED, ONCE YOU ARE IN YOUR ROOM YOU WILL BE ALLOWED TWO (2) VISITORS. 1 (ONE) VISITOR MAY STAY OVERNIGHT BUT MUST ARRIVE TO THE ROOM BY 8pm.  Minor children may have two parents present. Special consideration for safety and communication needs will be  reviewed on a case by case basis.  Special instructions:    Oral Hygiene is also important to reduce your risk of infection.  Remember - BRUSH YOUR TEETH THE MORNING OF SURGERY WITH YOUR REGULAR TOOTHPASTE   [facility]- Preparing For Surgery  Before surgery, you can play an important role. Because skin is not sterile, your skin needs to be as free of germs as possible. You can reduce the number of germs on your skin by washing with CHG (chlorahexidine gluconate) Soap before surgery.  CHG is an antiseptic cleaner which kills germs and bonds with the skin to continue  killing germs even after washing.     Please do not use if you have an allergy to CHG or antibacterial soaps. If your skin becomes reddened/irritated stop using the CHG.  Do not shave (including legs and underarms) for at least 48 hours prior to first CHG shower. It is OK to shave your face.  Please follow these instructions carefully.     Shower the NIGHT BEFORE SURGERY and the MORNING OF SURGERY with CHG Soap.   If you chose to wash your hair, wash your hair first as usual with your normal shampoo. After you shampoo, rinse your hair and body thoroughly to remove the shampoo.  Then [employer] and genitals (private parts) with your normal soap and rinse thoroughly to remove soap.  After that Use CHG Soap as you would any other liquid soap. You can apply CHG directly to the skin and wash gently with a scrungie or a clean washcloth.   Apply the CHG Soap to your body ONLY FROM THE NECK DOWN.  Do not use on open wounds or open sores. Avoid contact with your eyes, ears, mouth and genitals (private parts). Wash Face and genitals (private parts)  with your normal soap.   Wash thoroughly, paying special attention to the area where your surgery will be performed.  Thoroughly rinse your body with warm water from the neck down.  DO NOT shower/wash with your normal soap after using and rinsing off the CHG Soap.  Pat yourself dry with a CLEAN TOWEL.  Wear CLEAN PAJAMAS to bed the night before surgery  Place CLEAN SHEETS on your bed the night before your surgery  DO NOT SLEEP WITH PETS.   Day of Surgery: Take a shower with CHG soap. Wear Clean/Comfortable clothing the morning of surgery Do not apply any deodorants/lotions.   Remember to brush your teeth WITH YOUR REGULAR TOOTHPASTE.   Please read over the following fact sheets that you were given.

## 2021-06-09 ENCOUNTER — Encounter (HOSPITAL_COMMUNITY)
Admission: RE | Admit: 2021-06-09 | Discharge: 2021-06-09 | Disposition: A | Discharge status: Home / Self Care | Payer: Medicare FFS | Source: Ambulatory Visit | Attending: Neurological Surgery | Admitting: Neurological Surgery

## 2021-06-09 ENCOUNTER — Encounter (HOSPITAL_COMMUNITY): Payer: Self-pay

## 2021-06-09 ENCOUNTER — Other Ambulatory Visit: Payer: Self-pay

## 2021-06-09 VITALS — BP 138/73 | HR 69 | Temp 97.7°F | Resp 17 | Ht 66.0 in | Wt 195.8 lb

## 2021-06-09 DIAGNOSIS — I10 Essential (primary) hypertension: Secondary | ICD-10-CM | POA: Insufficient documentation

## 2021-06-09 DIAGNOSIS — Z20822 Contact with and (suspected) exposure to covid-19: Secondary | ICD-10-CM | POA: Insufficient documentation

## 2021-06-09 DIAGNOSIS — Z01818 Encounter for other preprocedural examination: Secondary | ICD-10-CM

## 2021-06-09 LAB — BASIC METABOLIC PANEL
Anion gap: 5 (ref 5–15)
BUN: 19 mg/dL (ref 8–23)
CO2: 27 mmol/L (ref 22–32)
Calcium: 8.8 mg/dL — ABNORMAL LOW (ref 8.9–10.3)
Chloride: 103 mmol/L (ref 98–111)
Creatinine, Ser: 1.35 mg/dL — ABNORMAL HIGH (ref 0.44–1.00)
GFR, Estimated: 43 mL/min — ABNORMAL LOW (ref >60)
Glucose, Bld: 93 mg/dL (ref 70–99)
Potassium: 4.5 mmol/L (ref 3.5–5.1)
Sodium: 135 mmol/L (ref 135–145)

## 2021-06-09 LAB — TYPE AND SCREEN
ABO/RH(D): O POS
Antibody Screen: NEGATIVE

## 2021-06-09 LAB — CBC
HCT: 35 % — ABNORMAL LOW (ref 36.0–46.0)
Hemoglobin: 11.5 g/dL — ABNORMAL LOW (ref 12.0–15.0)
MCH: 32 pg (ref 26.0–34.0)
MCHC: 32.9 g/dL (ref 30.0–36.0)
MCV: 97.5 fL (ref 80.0–100.0)
Platelets: 226 10*3/uL (ref 150–400)
RBC: 3.59 MIL/uL — ABNORMAL LOW (ref 3.87–5.11)
RDW: 11.7 % (ref 11.5–15.5)
WBC: 3.9 10*3/uL — ABNORMAL LOW (ref 4.0–10.5)
nRBC: 0 % (ref 0.0–0.2)

## 2021-06-09 LAB — SURGICAL PCR SCREEN
MRSA, PCR: POSITIVE — AB
Staphylococcus aureus: POSITIVE — AB

## 2021-06-09 LAB — SARS CORONAVIRUS 2 (TAT 6-24 HRS): SARS Coronavirus 2: NEGATIVE

## 2021-06-09 NOTE — Progress Notes (Signed)
PCP - Dr. Kathleene Hazel. [place] Cardiologist - denies  PPM/ICD - denies   Chest x-ray - 06/18/17 EKG - 06/09/21 at PAT Stress Test - denies ECHO - denies Cardiac Cath - denies  Sleep Study - denies  DM- denies  Blood Thinner Instructions: n/a Aspirin Instructions: n/a  ERAS Protcol - yes, no drink   COVID TEST- 06/09/21 at PAT   Anesthesia review: no  Patient denies shortness of breath, fever, cough and chest pain at PAT appointment   All instructions explained to the patient, with a verbal understanding of the material. Patient agrees to go over the instructions while at home for a better understanding. Patient also instructed to wear a mask in public after being tested for COVID-19. The opportunity to ask questions was provided.

## 2021-06-12 ENCOUNTER — Encounter (HOSPITAL_COMMUNITY): Payer: Self-pay | Admitting: Neurological Surgery

## 2021-06-12 ENCOUNTER — Inpatient Hospital Stay (HOSPITAL_COMMUNITY): Payer: Medicare FFS | Admitting: Certified Registered"

## 2021-06-12 ENCOUNTER — Inpatient Hospital Stay (HOSPITAL_COMMUNITY)
Admission: RE | Admit: 2021-06-12 | Discharge: 2021-06-12 | DRG: 496 | Disposition: A | Discharge status: Home / Self Care | Payer: Medicare FFS | Source: Ambulatory Visit | Attending: Neurological Surgery | Admitting: Neurological Surgery

## 2021-06-12 ENCOUNTER — Inpatient Hospital Stay (HOSPITAL_COMMUNITY): Payer: Medicare FFS

## 2021-06-12 ENCOUNTER — Encounter (HOSPITAL_COMMUNITY)
Admission: RE | Disposition: A | Discharge status: Home / Self Care | Payer: Self-pay | Source: Ambulatory Visit | Attending: Neurological Surgery

## 2021-06-12 DIAGNOSIS — Z833 Family history of diabetes mellitus: Secondary | ICD-10-CM | POA: Diagnosis not present

## 2021-06-12 DIAGNOSIS — Y831 Surgical operation with implant of artificial internal device as the cause of abnormal reaction of the patient, or of later complication, without mention of misadventure at the time of the procedure: Secondary | ICD-10-CM | POA: Diagnosis present

## 2021-06-12 DIAGNOSIS — K219 Gastro-esophageal reflux disease without esophagitis: Secondary | ICD-10-CM | POA: Diagnosis present

## 2021-06-12 DIAGNOSIS — Z96643 Presence of artificial hip joint, bilateral: Secondary | ICD-10-CM | POA: Diagnosis present

## 2021-06-12 DIAGNOSIS — Z8249 Family history of ischemic heart disease and other diseases of the circulatory system: Secondary | ICD-10-CM | POA: Diagnosis not present

## 2021-06-12 DIAGNOSIS — Z20822 Contact with and (suspected) exposure to covid-19: Secondary | ICD-10-CM | POA: Diagnosis present

## 2021-06-12 DIAGNOSIS — Z87891 Personal history of nicotine dependence: Secondary | ICD-10-CM

## 2021-06-12 DIAGNOSIS — E785 Hyperlipidemia, unspecified: Secondary | ICD-10-CM | POA: Diagnosis present

## 2021-06-12 DIAGNOSIS — Z96653 Presence of artificial knee joint, bilateral: Secondary | ICD-10-CM | POA: Diagnosis present

## 2021-06-12 DIAGNOSIS — I10 Essential (primary) hypertension: Secondary | ICD-10-CM | POA: Diagnosis present

## 2021-06-12 DIAGNOSIS — Z87442 Personal history of urinary calculi: Secondary | ICD-10-CM | POA: Diagnosis not present

## 2021-06-12 DIAGNOSIS — Z825 Family history of asthma and other chronic lower respiratory diseases: Secondary | ICD-10-CM

## 2021-06-12 DIAGNOSIS — S32009K Unspecified fracture of unspecified lumbar vertebra, subsequent encounter for fracture with nonunion: Secondary | ICD-10-CM | POA: Diagnosis present

## 2021-06-12 DIAGNOSIS — M960 Pseudarthrosis after fusion or arthrodesis: Secondary | ICD-10-CM | POA: Diagnosis present

## 2021-06-12 DIAGNOSIS — Z419 Encounter for procedure for purposes other than remedying health state, unspecified: Secondary | ICD-10-CM

## 2021-06-12 DIAGNOSIS — T84226A Displacement of internal fixation device of vertebrae, initial encounter: Secondary | ICD-10-CM | POA: Diagnosis present

## 2021-06-12 HISTORY — PX: HARDWARE REMOVAL: SHX979

## 2021-06-12 SURGERY — REMOVAL, HARDWARE
Anesthesia: General | Site: Spine Thoracic

## 2021-06-12 MED ORDER — ACETAMINOPHEN 325 MG PO TABS: 650.0000 mg | ORAL_TABLET | ORAL | Status: DC | PRN

## 2021-06-12 MED ORDER — LIDOCAINE 2% (20 MG/ML) 5 ML SYRINGE
INTRAMUSCULAR | Status: AC
  Filled 2021-06-12: qty 5

## 2021-06-12 MED ORDER — ONDANSETRON HCL 4 MG PO TABS: 4.0000 mg | ORAL_TABLET | Freq: Four times a day (QID) | ORAL | Status: DC | PRN

## 2021-06-12 MED ORDER — DEXAMETHASONE SODIUM PHOSPHATE 10 MG/ML IJ SOLN
INTRAMUSCULAR | Status: AC
  Filled 2021-06-12: qty 1

## 2021-06-12 MED ORDER — THROMBIN 5000 UNITS EX SOLR: OROMUCOSAL | Status: DC | PRN

## 2021-06-12 MED ORDER — CHLORHEXIDINE GLUCONATE CLOTH 2 % EX PADS: 6.0000 | MEDICATED_PAD | Freq: Once | CUTANEOUS | Status: DC

## 2021-06-12 MED ORDER — KETOROLAC TROMETHAMINE 15 MG/ML IJ SOLN
INTRAMUSCULAR | Status: AC
  Filled 2021-06-12: qty 1

## 2021-06-12 MED ORDER — GABAPENTIN 300 MG PO CAPS
300.0000 mg | ORAL_CAPSULE | Freq: Three times a day (TID) | ORAL | Status: DC
  Administered 2021-06-12: 300 mg via ORAL
  Filled 2021-06-12: qty 1

## 2021-06-12 MED ORDER — ALUM & MAG HYDROXIDE-SIMETH 200-200-20 MG/5ML PO SUSP: 30.0000 mL | Freq: Four times a day (QID) | ORAL | Status: DC | PRN

## 2021-06-12 MED ORDER — ONDANSETRON HCL 4 MG/2ML IJ SOLN: 4.0000 mg | Freq: Four times a day (QID) | INTRAMUSCULAR | Status: DC | PRN

## 2021-06-12 MED ORDER — ACETAMINOPHEN 650 MG RE SUPP: 650.0000 mg | RECTAL | Status: DC | PRN

## 2021-06-12 MED ORDER — HYDROCODONE-ACETAMINOPHEN 5-325 MG PO TABS: 1.0000 | ORAL_TABLET | ORAL | Status: DC | PRN

## 2021-06-12 MED ORDER — CELECOXIB 200 MG PO CAPS: 200.0000 mg | ORAL_CAPSULE | Freq: Every morning | ORAL | Status: DC

## 2021-06-12 MED ORDER — BUPIVACAINE HCL (PF) 0.5 % IJ SOLN
INTRAMUSCULAR | Status: AC
  Filled 2021-06-12: qty 30

## 2021-06-12 MED ORDER — LIDOCAINE-EPINEPHRINE 1 %-1:100000 IJ SOLN
INTRAMUSCULAR | Status: AC
  Filled 2021-06-12: qty 1

## 2021-06-12 MED ORDER — ONDANSETRON HCL 4 MG/2ML IJ SOLN
INTRAMUSCULAR | Status: AC
  Filled 2021-06-12: qty 2

## 2021-06-12 MED ORDER — THROMBIN 5000 UNITS EX SOLR
CUTANEOUS | Status: AC
  Filled 2021-06-12: qty 10000

## 2021-06-12 MED ORDER — PANTOPRAZOLE SODIUM 40 MG PO TBEC: 40.0000 mg | DELAYED_RELEASE_TABLET | Freq: Every day | ORAL | Status: DC

## 2021-06-12 MED ORDER — DEXAMETHASONE SODIUM PHOSPHATE 10 MG/ML IJ SOLN
INTRAMUSCULAR | Status: DC | PRN
  Administered 2021-06-12: 10 mg via INTRAVENOUS

## 2021-06-12 MED ORDER — PHENYLEPHRINE HCL (PRESSORS) 10 MG/ML IV SOLN
INTRAVENOUS | Status: AC
  Filled 2021-06-12: qty 1

## 2021-06-12 MED ORDER — DOCUSATE SODIUM 100 MG PO CAPS: 100.0000 mg | ORAL_CAPSULE | Freq: Two times a day (BID) | ORAL | Status: DC

## 2021-06-12 MED ORDER — BUPIVACAINE HCL (PF) 0.5 % IJ SOLN
INTRAMUSCULAR | Status: DC | PRN
  Administered 2021-06-12: 20 mL

## 2021-06-12 MED ORDER — POLYETHYLENE GLYCOL 3350 17 G PO PACK: 17.0000 g | PACK | Freq: Every day | ORAL | Status: DC | PRN

## 2021-06-12 MED ORDER — ONDANSETRON HCL 4 MG/2ML IJ SOLN
INTRAMUSCULAR | Status: DC | PRN
  Administered 2021-06-12: 4 mg via INTRAVENOUS

## 2021-06-12 MED ORDER — LACTATED RINGERS IV SOLN: INTRAVENOUS | Status: DC

## 2021-06-12 MED ORDER — PHENYLEPHRINE 40 MCG/ML (10ML) SYRINGE FOR IV PUSH (FOR BLOOD PRESSURE SUPPORT)
PREFILLED_SYRINGE | INTRAVENOUS | Status: DC | PRN
  Administered 2021-06-12 (×2): 80 ug via INTRAVENOUS

## 2021-06-12 MED ORDER — ATORVASTATIN CALCIUM 40 MG PO TABS: 40.0000 mg | ORAL_TABLET | Freq: Every day | ORAL | Status: DC

## 2021-06-12 MED ORDER — LIDOCAINE-EPINEPHRINE 1 %-1:100000 IJ SOLN
INTRAMUSCULAR | Status: DC | PRN
  Administered 2021-06-12: 5 mL

## 2021-06-12 MED ORDER — PHENYLEPHRINE HCL-NACL 20-0.9 MG/250ML-% IV SOLN
INTRAVENOUS | Status: DC | PRN
  Administered 2021-06-12: 40 ug/min via INTRAVENOUS

## 2021-06-12 MED ORDER — FLEET ENEMA 7-19 GM/118ML RE ENEM: 1.0000 | ENEMA | Freq: Once | RECTAL | Status: DC | PRN

## 2021-06-12 MED ORDER — ORAL CARE MOUTH RINSE: 15.0000 mL | Freq: Once | OROMUCOSAL | Status: AC

## 2021-06-12 MED ORDER — PROMETHAZINE HCL 25 MG PO TABS: 25.0000 mg | ORAL_TABLET | Freq: Three times a day (TID) | ORAL | Status: DC | PRN

## 2021-06-12 MED ORDER — FENTANYL CITRATE (PF) 250 MCG/5ML IJ SOLN
INTRAMUSCULAR | Status: DC | PRN
  Administered 2021-06-12: 100 ug via INTRAVENOUS

## 2021-06-12 MED ORDER — ROCURONIUM BROMIDE 10 MG/ML (PF) SYRINGE
PREFILLED_SYRINGE | INTRAVENOUS | Status: AC
  Filled 2021-06-12: qty 10

## 2021-06-12 MED ORDER — THROMBIN 5000 UNITS EX SOLR
CUTANEOUS | Status: DC | PRN
  Administered 2021-06-12 (×2): 5000 [IU] via TOPICAL

## 2021-06-12 MED ORDER — EPHEDRINE 5 MG/ML INJ
INTRAVENOUS | Status: AC
  Filled 2021-06-12: qty 5

## 2021-06-12 MED ORDER — CHLORHEXIDINE GLUCONATE 0.12 % MT SOLN
15.0000 mL | Freq: Once | OROMUCOSAL | Status: AC
  Administered 2021-06-12: 15 mL via OROMUCOSAL
  Filled 2021-06-12: qty 15

## 2021-06-12 MED ORDER — PROPOFOL 10 MG/ML IV BOLUS
INTRAVENOUS | Status: AC
  Filled 2021-06-12: qty 20

## 2021-06-12 MED ORDER — TRAMADOL HCL 50 MG PO TABS: 50.0000 mg | ORAL_TABLET | Freq: Four times a day (QID) | ORAL | Status: DC | PRN

## 2021-06-12 MED ORDER — PHENYLEPHRINE 40 MCG/ML (10ML) SYRINGE FOR IV PUSH (FOR BLOOD PRESSURE SUPPORT)
PREFILLED_SYRINGE | INTRAVENOUS | Status: AC
  Filled 2021-06-12: qty 30

## 2021-06-12 MED ORDER — OXYCODONE-ACETAMINOPHEN 5-325 MG PO TABS: 1.0000 | ORAL_TABLET | Freq: Three times a day (TID) | ORAL | Status: DC | PRN

## 2021-06-12 MED ORDER — 0.9 % SODIUM CHLORIDE (POUR BTL) OPTIME
TOPICAL | Status: DC | PRN
  Administered 2021-06-12: 1000 mL

## 2021-06-12 MED ORDER — CEFAZOLIN SODIUM-DEXTROSE 2-4 GM/100ML-% IV SOLN
2.0000 g | INTRAVENOUS | Status: AC
  Administered 2021-06-12: 2 g via INTRAVENOUS
  Filled 2021-06-12: qty 100

## 2021-06-12 MED ORDER — LIDOCAINE 2% (20 MG/ML) 5 ML SYRINGE
INTRAMUSCULAR | Status: DC | PRN
  Administered 2021-06-12: 80 mg via INTRAVENOUS

## 2021-06-12 MED ORDER — SODIUM CHLORIDE 0.9 % IV SOLN
250.0000 mL | INTRAVENOUS | Status: DC
  Administered 2021-06-12: 250 mL via INTRAVENOUS

## 2021-06-12 MED ORDER — DEXMEDETOMIDINE (PRECEDEX) IN NS 20 MCG/5ML (4 MCG/ML) IV SYRINGE
PREFILLED_SYRINGE | INTRAVENOUS | Status: AC
  Filled 2021-06-12: qty 5

## 2021-06-12 MED ORDER — PROPOFOL 10 MG/ML IV BOLUS
INTRAVENOUS | Status: DC | PRN
  Administered 2021-06-12: 140 mg via INTRAVENOUS

## 2021-06-12 MED ORDER — CEFAZOLIN SODIUM-DEXTROSE 2-4 GM/100ML-% IV SOLN
2.0000 g | Freq: Three times a day (TID) | INTRAVENOUS | Status: DC
  Administered 2021-06-12: 2 g via INTRAVENOUS
  Filled 2021-06-12: qty 100

## 2021-06-12 MED ORDER — SODIUM CHLORIDE 0.9% FLUSH
3.0000 mL | Freq: Two times a day (BID) | INTRAVENOUS | Status: DC
  Administered 2021-06-12: 3 mL via INTRAVENOUS

## 2021-06-12 MED ORDER — SENNA 8.6 MG PO TABS: 1.0000 | ORAL_TABLET | Freq: Two times a day (BID) | ORAL | Status: DC

## 2021-06-12 MED ORDER — ALBUTEROL SULFATE HFA 108 (90 BASE) MCG/ACT IN AERS
INHALATION_SPRAY | RESPIRATORY_TRACT | Status: AC
  Filled 2021-06-12: qty 6.7

## 2021-06-12 MED ORDER — KETOROLAC TROMETHAMINE 15 MG/ML IJ SOLN
15.0000 mg | Freq: Four times a day (QID) | INTRAMUSCULAR | Status: DC
  Administered 2021-06-12: 15 mg via INTRAVENOUS

## 2021-06-12 MED ORDER — ROCURONIUM BROMIDE 10 MG/ML (PF) SYRINGE
PREFILLED_SYRINGE | INTRAVENOUS | Status: DC | PRN
  Administered 2021-06-12: 50 mg via INTRAVENOUS

## 2021-06-12 MED ORDER — SODIUM CHLORIDE 0.9% FLUSH: 3.0000 mL | INTRAVENOUS | Status: DC | PRN

## 2021-06-12 MED ORDER — LISINOPRIL-HYDROCHLOROTHIAZIDE 20-25 MG PO TABS: 1.0000 | ORAL_TABLET | Freq: Every day | ORAL | Status: DC

## 2021-06-12 MED ORDER — PHENOL 1.4 % MT LIQD: 1.0000 | OROMUCOSAL | Status: DC | PRN

## 2021-06-12 MED ORDER — THROMBIN 5000 UNITS EX SOLR
CUTANEOUS | Status: AC
  Filled 2021-06-12: qty 5000

## 2021-06-12 MED ORDER — BISACODYL 10 MG RE SUPP: 10.0000 mg | Freq: Every day | RECTAL | Status: DC | PRN

## 2021-06-12 MED ORDER — FENTANYL CITRATE (PF) 250 MCG/5ML IJ SOLN
INTRAMUSCULAR | Status: AC
  Filled 2021-06-12: qty 5

## 2021-06-12 MED ORDER — FENTANYL CITRATE (PF) 100 MCG/2ML IJ SOLN: 25.0000 ug | INTRAMUSCULAR | Status: DC | PRN

## 2021-06-12 MED ORDER — MENTHOL 3 MG MT LOZG: 1.0000 | LOZENGE | OROMUCOSAL | Status: DC | PRN

## 2021-06-12 SURGICAL SUPPLY — 51 items
BAG COUNTER SPONGE SURGICOUNT (BAG) ×2 IMPLANT
BENZOIN TINCTURE PRP APPL 2/3 (GAUZE/BANDAGES/DRESSINGS) IMPLANT
BLADE CLIPPER SURG (BLADE) IMPLANT
BUR CROSS CUT FISSURE 1.2 (BURR) ×4 IMPLANT
CANISTER SUCT 3000ML PPV (MISCELLANEOUS) ×2 IMPLANT
CARTRIDGE OIL MAESTRO DRILL (MISCELLANEOUS) IMPLANT
DECANTER SPIKE VIAL GLASS SM (MISCELLANEOUS) IMPLANT
DERMABOND ADVANCED (GAUZE/BANDAGES/DRESSINGS) ×1
DERMABOND ADVANCED .7 DNX12 (GAUZE/BANDAGES/DRESSINGS) ×1 IMPLANT
DIFFUSER DRILL AIR PNEUMATIC (MISCELLANEOUS) IMPLANT
DRAPE C-ARM 42X72 X-RAY (DRAPES) ×2 IMPLANT
DRAPE LAPAROTOMY 100X72 PEDS (DRAPES) IMPLANT
DRAPE LAPAROTOMY 100X72X124 (DRAPES) IMPLANT
DURAPREP 26ML APPLICATOR (WOUND CARE) ×2 IMPLANT
ELECT BLADE 4.0 EZ CLEAN MEGAD (MISCELLANEOUS) ×2
ELECT REM PT RETURN 9FT ADLT (ELECTROSURGICAL) ×2
ELECTRODE BLDE 4.0 EZ CLN MEGD (MISCELLANEOUS) ×1 IMPLANT
ELECTRODE REM PT RTRN 9FT ADLT (ELECTROSURGICAL) ×1 IMPLANT
GAUZE 4X4 16PLY ~~LOC~~+RFID DBL (SPONGE) IMPLANT
GAUZE SPONGE 4X4 12PLY STRL (GAUZE/BANDAGES/DRESSINGS) ×2 IMPLANT
GLOVE SURG LTX SZ8.5 (GLOVE) ×2 IMPLANT
GLOVE SURG UNDER POLY LF SZ8.5 (GLOVE) ×2 IMPLANT
GOWN STRL REUS W/ TWL LRG LVL3 (GOWN DISPOSABLE) ×3 IMPLANT
GOWN STRL REUS W/ TWL XL LVL3 (GOWN DISPOSABLE) IMPLANT
GOWN STRL REUS W/TWL 2XL LVL3 (GOWN DISPOSABLE) ×2 IMPLANT
GOWN STRL REUS W/TWL LRG LVL3 (GOWN DISPOSABLE) ×6
GOWN STRL REUS W/TWL XL LVL3 (GOWN DISPOSABLE)
HEMOSTAT POWDER KIT SURGIFOAM (HEMOSTASIS) ×2 IMPLANT
KIT BASIN OR (CUSTOM PROCEDURE TRAY) ×2 IMPLANT
KIT TURNOVER KIT B (KITS) ×2 IMPLANT
MARKER SKIN DUAL TIP RULER LAB (MISCELLANEOUS) ×2 IMPLANT
NEEDLE HYPO 22GX1.5 SAFETY (NEEDLE) ×2 IMPLANT
NS IRRIG 1000ML POUR BTL (IV SOLUTION) ×2 IMPLANT
OIL CARTRIDGE MAESTRO DRILL (MISCELLANEOUS)
PACK LAMINECTOMY NEURO (CUSTOM PROCEDURE TRAY) ×2 IMPLANT
PAD ARMBOARD 7.5X6 YLW CONV (MISCELLANEOUS) ×6 IMPLANT
RASP 3.0MM (RASP) IMPLANT
SPONGE SURGIFOAM ABS GEL SZ50 (HEMOSTASIS) ×2 IMPLANT
SPONGE T-LAP 4X18 ~~LOC~~+RFID (SPONGE) IMPLANT
STAPLER SKIN PROX WIDE 3.9 (STAPLE) IMPLANT
STRIP CLOSURE SKIN 1/2X4 (GAUZE/BANDAGES/DRESSINGS) IMPLANT
SUT VIC AB 0 CT1 18XCR BRD8 (SUTURE) ×1 IMPLANT
SUT VIC AB 0 CT1 8-18 (SUTURE) ×2
SUT VIC AB 2-0 CP2 18 (SUTURE) IMPLANT
SUT VIC AB 3-0 SH 8-18 (SUTURE) ×2 IMPLANT
SUT VIC AB 4-0 RB1 18 (SUTURE) IMPLANT
SWAB COLLECTION DEVICE MRSA (MISCELLANEOUS) IMPLANT
SWAB CULTURE ESWAB REG 1ML (MISCELLANEOUS) IMPLANT
TOWEL GREEN STERILE (TOWEL DISPOSABLE) ×2 IMPLANT
TOWEL GREEN STERILE FF (TOWEL DISPOSABLE) ×2 IMPLANT
WATER STERILE IRR 1000ML POUR (IV SOLUTION) ×2 IMPLANT

## 2021-06-12 NOTE — Anesthesia Procedure Notes (Signed)
Procedure Name: Intubation Date/Time: 06/12/2021 11:19 AM Performed by: Myna Bright, CRNA Pre-anesthesia Checklist: Patient identified, Emergency Drugs available, Suction available and Patient being monitored Patient Re-evaluated:Patient Re-evaluated prior to induction Oxygen Delivery Method: Circle system utilized Preoxygenation: Pre-oxygenation with 100% oxygen Induction Type: IV induction Ventilation: Mask ventilation without difficulty Laryngoscope Size: Mac and 3 Grade View: Grade II Tube type: Oral Tube size: 7.0 mm Number of attempts: 1 Airway Equipment and Method: Stylet Placement Confirmation: ETT inserted through vocal cords under direct vision, positive ETCO2 and breath sounds checked- equal and bilateral Secured at: 21 cm Tube secured with: Tape Dental Injury: Teeth and Oropharynx as per pre-operative assessment

## 2021-06-12 NOTE — Progress Notes (Signed)
Patient alert and oriented, mae's well, voiding adequate amount of urine, swallowing without difficulty, no c/o pain at time of discharge. Patient discharged home with family. Script and discharged instructions given to patient. Patient and family stated understanding of instructions given. Patient has an appointment with Dr. Elsner in 3 weeks 

## 2021-06-12 NOTE — Anesthesia Preprocedure Evaluation (Addendum)
Anesthesia Evaluation  Patient identified by MRN, date of birth, ID band Patient awake    Reviewed: Allergy & Precautions, NPO status , Patient's Chart, lab work & pertinent test results  History of Anesthesia Complications (+) PONV  Airway Mallampati: II  TM Distance: >3 FB     Dental   Pulmonary former smoker,    breath sounds clear to auscultation       Cardiovascular hypertension,  Rhythm:Regular Rate:Normal     Neuro/Psych  Neuromuscular disease    GI/Hepatic Neg liver ROS, GERD  ,  Endo/Other  negative endocrine ROS  Renal/GU negative Renal ROS     Musculoskeletal  (+) Arthritis ,   Abdominal   Peds  Hematology   Anesthesia Other Findings   Reproductive/Obstetrics                             Anesthesia Physical Anesthesia Plan  ASA: 3  Anesthesia Plan: General   Post-op Pain Management:    Induction: Intravenous  PONV Risk Score and Plan: 4 or greater and Ondansetron, Dexamethasone and Midazolam  Airway Management Planned:   Additional Equipment:   Intra-op Plan:   Post-operative Plan: Extubation in OR  Informed Consent: I have reviewed the patients History and Physical, chart, labs and discussed the procedure including the risks, benefits and alternatives for the proposed anesthesia with the patient or authorized representative who has indicated his/her understanding and acceptance.     Dental advisory given  Plan Discussed with: CRNA and Anesthesiologist  Anesthesia Plan Comments:         Anesthesia Quick Evaluation

## 2021-06-12 NOTE — Plan of Care (Signed)
Adequately Ready for Discharge 

## 2021-06-12 NOTE — H&P (Signed)
Kathy Coleman is an 65 y.o. female.   Chief Complaint: Thoracic back pain HPI: Kathy Coleman is a 65 year old individual whose undergone previous decompression fusion from the level of T9 down to the sacrum.  She had an augmented fusion performed and had healed her lumbar spine and her thoracic spine to the level of T10 however at the T9-T10 junction she developed some loosening of the hardware in the bone despite the methacrylate augmentation.  After careful consideration of her options I advised simply removing the upper screws at the T9 vertebrae and she is now admitted to undergo this procedure.  Past Medical History:  Diagnosis Date   Arthritis    Constipation    takes Miralax nightly   GERD (gastroesophageal reflux disease)    takes Zantac daily   History of blood transfusion    History of kidney stones    Hyperlipidemia    takes Atorvastatin daily   Hypertension    takes Lisinopril daily   Osteoarthritis    left hip   Osteoarthritis of left knee    PONV (postoperative nausea and vomiting)    in the past did not have with last surgery in december; did well w/scopalamine patch    Past Surgical History:  Procedure Laterality Date   ABDOMINAL HYSTERECTOMY     ANTERIOR LAT LUMBAR FUSION N/A 03/14/2019   Procedure: THORACIC TWELVE TO LUMBAR ONE ANTEROLATERAL DECOMPRESSION;  Surgeon: Barnett Abu, MD;  Location: [facility] OR;  Service: Neurosurgery;  Laterality: N/A;   APPLICATION OF ROBOTIC ASSISTANCE FOR SPINAL PROCEDURE N/A 03/14/2019   Procedure: APPLICATION OF ROBOTIC ASSISTANCE FOR SPINAL PROCEDURE;  Surgeon: Barnett Abu, MD;  Location: [facility] OR;  Service: Neurosurgery;  Laterality: N/A;   BACK SURGERY     1998  , 2010  17 LOW BACK     BIOPSY  02/13/2016   Procedure: BIOPSY;  Surgeon: Corbin Ade, MD;  Location: [facility];  Service: Endoscopy;;  Gastric and Esophageal biopsies   BIOPSY  08/17/2019   Procedure: BIOPSY;  Surgeon: Corbin Ade, MD;  Location: [facility];   Service: Endoscopy;;   CHOLECYSTECTOMY     COLONOSCOPY  03/24/2006   Dr.Rourk- a couple of anal papillae, o/w normal rectum with L sided diverticula, the remainder of the colonic mucosa appeared normal.    COLONOSCOPY N/A 02/13/2016   Dr. Jena Gauss: diverticulosis, melanosis coli    ESOPHAGOGASTRODUODENOSCOPY  03/24/2006   Dr.Rourk- normal esophagus. a couple of antral erosions, o/w normal stomach, patent pylorus, normal D1,D2   ESOPHAGOGASTRODUODENOSCOPY N/A 02/13/2016   Dr. Jena Gauss: erythematous mucosa, normal D2, empiric dilation    ESOPHAGOGASTRODUODENOSCOPY (EGD) WITH PROPOFOL N/A 08/17/2019   Procedure: ESOPHAGOGASTRODUODENOSCOPY (EGD) WITH PROPOFOL;  Surgeon: Corbin Ade, MD;  [facility]: [facility];  Service: Endoscopy;  Laterality: N/A;  9:15am   FOOT SURGERY     RIGHT  2014   LUMBAR PERCUTANEOUS PEDICLE SCREW 1 LEVEL N/A 03/14/2019   Procedure: MWNUU GUIDED PEDICLE SCREWS THORACIC TEN TO Lumbar THREE;  Surgeon: Barnett Abu, MD;  Location: [facility] OR;  Service: Neurosurgery;  Laterality: N/A;   MALONEY DILATION N/A 02/13/2016   Procedure: Elease Hashimoto DILATION;  Surgeon: Corbin Ade, MD;  [facility]: [facility];  Service: Endoscopy;  Laterality: N/A;   MALONEY DILATION N/A 08/17/2019   Procedure: Elease Hashimoto DILATION;  Surgeon: Corbin Ade, MD;  [facility]: [facility];  Service: Endoscopy;  Laterality: N/A;   NECK SURGERY     2005, 2010   TOTAL HIP  ARTHROPLASTY Right 05/13/2015   Procedure: TOTAL HIP ARTHROPLASTY ANTERIOR APPROACH;  Surgeon: Gean Birchwood, MD;  Location: [facility] OR;  Service: Orthopedics;  Laterality: Right;   TOTAL HIP ARTHROPLASTY Left 06/15/2016   Procedure: TOTAL HIP ARTHROPLASTY ANTERIOR APPROACH;  Surgeon: Gean Birchwood, MD;  Location: [facility] OR;  Service: Orthopedics;  Laterality: Left;   TOTAL KNEE ARTHROPLASTY Right 06/24/2015   Procedure: TOTAL KNEE ARTHROPLASTY;  Surgeon: Gean Birchwood, MD;  Location: [facility] OR;  Service: Orthopedics;  Laterality: Right;   TOTAL KNEE ARTHROPLASTY Left  06/21/2017   Procedure: LEFT TOTAL KNEE ARTHROPLASTY;  Surgeon: Gean Birchwood, MD;  Location: [facility] OR;  Service: Orthopedics;  Laterality: Left;    Family History  Problem Relation Age of Onset   Hypertension Mother    COPD Mother    Diabetes Mother    Heart disease Father    Diabetes Father    Hypertension Father    Social History:  reports that she quit smoking about 40 years ago. Her smoking use included cigarettes. She has never used smokeless tobacco. She reports that she does not drink alcohol and does not use drugs.  Allergies:  Allergies  Allergen Reactions   Decadron [Dexamethasone] Other (See Comments)    Severe burning in perineal area  Pt says she's not allergic to this medication    Medications Prior to Admission  Medication Sig Dispense Refill   atorvastatin (LIPITOR) 40 MG tablet Take 40 mg by mouth at bedtime.     celecoxib (CELEBREX) 200 MG capsule Take 200 mg by mouth in the morning.     gabapentin (NEURONTIN) 300 MG capsule Take 300 mg by mouth 3 (three) times daily.     HYDROcodone-acetaminophen (NORCO/VICODIN) 5-325 MG tablet Take 1 tablet by mouth every 4 (four) hours as needed. 10 tablet 0   lisinopril-hydrochlorothiazide (ZESTORETIC) 20-25 MG tablet Take 1 tablet by mouth daily.     omeprazole (PRILOSEC) 20 MG capsule Take 20 mg by mouth at bedtime as needed (acid reflux/indigestion.).     traMADol (ULTRAM) 50 MG tablet Take 50 mg by mouth every 6 (six) hours as needed for moderate pain.     mupirocin ointment (BACTROBAN) 2 % Apply 1 application topically 2 (two) times daily. (Patient not taking: Reported on 06/03/2021) 30 g 2   oxyCODONE-acetaminophen (PERCOCET/ROXICET) 5-325 MG tablet Take 1 tablet by mouth every 8 (eight) hours as needed for severe pain. (Patient not taking: Reported on 06/03/2021) 15 tablet 0   promethazine (PHENERGAN) 25 MG tablet Take 1 tablet (25 mg total) by mouth every 8 (eight) hours as needed for nausea or vomiting. (Patient not  taking: Reported on 06/03/2021) 20 tablet 0    No results found for this or any previous visit (from the past 48 hour(s)). No results found.  Review of Systems  Musculoskeletal:  Positive for back pain.  All other systems reviewed and are negative.  Blood pressure 129/61, pulse 73, temperature (!) 97.5 F (36.4 C), temperature source Oral, resp. rate 18, height 5\' 6"  (1.676 m), weight 86.2 kg, SpO2 97 %. Physical Exam Constitutional:      Appearance: Normal appearance. She is normal weight.  HENT:     Head: Normocephalic and atraumatic.     Right Ear: Tympanic membrane normal.     Left Ear: Tympanic membrane normal.     Nose: Nose normal.     Mouth/Throat:     Mouth: Mucous membranes are moist.  Eyes:     Extraocular Movements: Extraocular movements intact.  Pupils: Pupils are equal, round, and reactive to light.  Cardiovascular:     Rate and Rhythm: Normal rate.     Pulses: Normal pulses.     Heart sounds: Normal heart sounds.  Abdominal:     General: Abdomen is flat. Bowel sounds are normal.     Palpations: Abdomen is soft.  Musculoskeletal:        General: Normal range of motion.     Cervical back: Normal range of motion and neck supple.  Skin:    General: Skin is warm and dry.     Capillary Refill: Capillary refill takes less than 2 seconds.  Neurological:     General: No focal deficit present.     Mental Status: She is alert.  Psychiatric:        Mood and Affect: Mood normal.        Behavior: Behavior normal.        Thought Content: Thought content normal.        Judgment: Judgment normal.     Assessment/Plan Status post T9 to sacrum arthrodesis with loosening of hardware T9.  Pseudoarthrosis T9-T10.  Plan: Removal of pedicle screws and hardware from T9.  Stefani Dama, MD 06/12/2021, 10:58 AM

## 2021-06-12 NOTE — Evaluation (Signed)
Physical Therapy Evaluation and Discharge Patient Details Name: Kathy Coleman MRN: [id number] DOB: 08-20-1955 Today's Date: 06/12/2021  History of Present Illness  Pt is a 65 y.o female who presents s/p T10 pedicle screw removal on 06/12/2021. PMH significant for T11-sacrum fusion over a number of years, HTN, OA, B TKR, B THR.   Clinical Impression  Patient evaluated by Physical Therapy with no further acute PT needs identified. All education has been completed and the patient has no further questions. Pt was able to demonstrate transfers and ambulation with gross modified independence and no AD. Pt was educated on precautions, positioning recommendations, appropriate activity progression, and car transfer. See below for any follow-up Physical Therapy or equipment needs. PT is signing off. Thank you for this referral.        Recommendations for follow up therapy are one component of a multi-disciplinary discharge planning process, led by the attending physician.  Recommendations may be updated based on patient status, additional functional criteria and insurance authorization.  Follow Up Recommendations No PT follow up    Assistance Recommended at Discharge PRN  Functional Status Assessment Patient has had a recent decline in their functional status and demonstrates the ability to make significant improvements in function in a reasonable and predictable amount of time.  Equipment Recommendations  None recommended by PT    Recommendations for Other Services       Precautions / Restrictions Precautions Precautions: Fall;Back Precaution Booklet Issued: Yes (comment) Precaution Comments: Reviewed handout and pt was cued for precautions during functional mobility Required Braces or Orthoses:  (No brace needed order) Restrictions Weight Bearing Restrictions: No      Mobility  Bed Mobility Overal bed mobility: Modified Independent                  Transfers Overall transfer  level: Modified independent Equipment used: None                    Ambulation/Gait Ambulation/Gait assistance: Modified independent (Device/Increase time) Gait Distance (Feet): 560 Feet Assistive device: None Gait Pattern/deviations: WFL(Within Functional Limits)   Gait velocity interpretation: >2.62 ft/sec, indicative of community ambulatory      Stairs            Wheelchair Mobility    Modified Rankin (Stroke Patients Only)       Balance Overall balance assessment: No apparent balance deficits (not formally assessed)                                           Pertinent Vitals/Pain Pain Assessment: No/denies pain    Home Living Family/patient expects to be discharged to:: Private residence Living Arrangements: Spouse/significant other Available Help at Discharge: Family;Available 24 hours/day Type of Home: House Home Access: Level entry       Home Layout: One level Home Equipment: Grab bars - tub/shower;Hand held shower head;BSC/3in1;Shower seat;Adaptive equipment;Rolling Walker (2 wheels)      Prior Function Prior Level of Function : Independent/Modified Independent             Mobility Comments: Limited by pain but overall still independent       Hand Dominance   Dominant Hand: Right    Extremity/Trunk Assessment   Upper Extremity Assessment Upper Extremity Assessment: Overall WFL for tasks assessed    Lower Extremity Assessment Lower Extremity Assessment: Overall WFL for tasks assessed  Cervical / Trunk Assessment Cervical / Trunk Assessment: Back Surgery  Communication   Communication: No difficulties  Cognition Arousal/Alertness: Awake/alert Behavior During Therapy: WFL for tasks assessed/performed Overall Cognitive Status: Within Functional Limits for tasks assessed                                          General Comments      Exercises     Assessment/Plan    PT Assessment  Patient does not need any further PT services  PT Problem List         PT Treatment Interventions      PT Goals (Current goals can be found in the Care Plan section)  Acute Rehab PT Goals Patient Stated Goal: Home today, ASAP PT Goal Formulation: All assessment and education complete, DC therapy    Frequency     Barriers to discharge        Co-evaluation               AM-PAC PT "6 Clicks" Mobility  Outcome Measure Help needed turning from your back to your side while in a flat bed without using bedrails?: None Help needed moving from lying on your back to sitting on the side of a flat bed without using bedrails?: None Help needed moving to and from a bed to a chair (including a wheelchair)?: None Help needed standing up from a chair using your arms (e.g., wheelchair or bedside chair)?: None Help needed to walk in hospital room?: None Help needed climbing 3-5 steps with a railing? : None 6 Click Score: 24    End of Session Equipment Utilized During Treatment: Gait belt Activity Tolerance: Patient tolerated treatment well Patient left: in chair;with call bell/phone within reach;with family/visitor present Nurse Communication: Mobility status PT Visit Diagnosis: Unsteadiness on feet (R26.81);Difficulty in walking, not elsewhere classified (R26.2)    Time: 2683-4196 PT Time Calculation (min) (ACUTE ONLY): 11 min   Charges:   PT Evaluation $PT Eval Low Complexity: 1 Low          Conni Slipper, PT, DPT Acute Rehabilitation Services Pager: [phone] Office: [phone]   Marylynn Pearson 06/12/2021, 3:45 PM

## 2021-06-12 NOTE — Anesthesia Postprocedure Evaluation (Signed)
Anesthesia Post Note  Patient: Kathy Coleman  Procedure(s) Performed: Removal of T10 screw with Metrx (Spine Thoracic)     Patient location during evaluation: PACU Anesthesia Type: General Level of consciousness: awake Pain management: pain level controlled Vital Signs Assessment: post-procedure vital signs reviewed and stable Respiratory status: spontaneous breathing Cardiovascular status: stable Postop Assessment: no apparent nausea or vomiting Anesthetic complications: no   No notable events documented.  Last Vitals:  Vitals:   06/12/21 1351 06/12/21 1406  BP: 140/69 132/86  Pulse: 61 66  Resp: 13 14  Temp:  (P) 36.5 C  SpO2: 96% 98%    Last Pain:  Vitals:   06/12/21 1351  TempSrc:   PainSc: 0-No pain                  

## 2021-06-12 NOTE — Discharge Summary (Signed)
Physician Discharge Summary  Patient ID: Kathy Coleman MRN: [id number] DOB/AGE: 12-24-1955 65 y.o.  Admit date: 06/12/2021 Discharge date: 06/12/2021  Admission Diagnoses: Pseudoarthrosis T10-T11  Discharge Diagnoses: Pseudoarthrosis T10-T11 Principal Problem:   Pseudoarthrosis of lumbar spine   Discharged Condition: good  Hospital Course: Patient was admitted to undergo removal of T10 pedicle screws that had become loosened and the pseudoarthrosis at T10-T11.  She tolerated surgery well.  Consults: None  Significant Diagnostic Studies: None  Treatments: surgery: See op note  Discharge Exam: Blood pressure (!) 120/59, pulse 73, temperature 97.8 F (36.6 C), resp. rate 18, height 5\' 6"  (1.676 m), weight 86.2 kg, SpO2 99 %. Incision is clean and dry Station and gait are intact.  Disposition: Discharge disposition: 01-Home or Self Care       Discharge Instructions     Call MD for:  redness, tenderness, or signs of infection (pain, swelling, redness, odor or green/yellow discharge around incision site)   Complete by: As directed    Call MD for:  severe uncontrolled pain   Complete by: As directed    Call MD for:  temperature >100.4   Complete by: As directed    Diet - low sodium heart healthy   Complete by: As directed    Discharge wound care:   Complete by: As directed    Okay to shower. Do not apply salves or appointments to incision. No heavy lifting with the upper extremities greater than 10 pounds. May resume driving when not requiring pain medication and patient feels comfortable with doing so.   Incentive spirometry RT   Complete by: As directed    Increase activity slowly   Complete by: As directed       Allergies as of 06/12/2021   No Active Allergies      Medication List     TAKE these medications    atorvastatin 40 MG tablet Commonly known as: LIPITOR Take 40 mg by mouth at bedtime.   celecoxib 200 MG capsule Commonly known as: CELEBREX Take  200 mg by mouth in the morning.   gabapentin 300 MG capsule Commonly known as: NEURONTIN Take 300 mg by mouth 3 (three) times daily.   HYDROcodone-acetaminophen 5-325 MG tablet Commonly known as: NORCO/VICODIN Take 1 tablet by mouth every 4 (four) hours as needed.   lisinopril-hydrochlorothiazide 20-25 MG tablet Commonly known as: ZESTORETIC Take 1 tablet by mouth daily.   mupirocin ointment 2 % Commonly known as: BACTROBAN Apply 1 application topically 2 (two) times daily.   omeprazole 20 MG capsule Commonly known as: PRILOSEC Take 20 mg by mouth at bedtime as needed (acid reflux/indigestion.).   oxyCODONE-acetaminophen 5-325 MG tablet Commonly known as: PERCOCET/ROXICET Take 1 tablet by mouth every 8 (eight) hours as needed for severe pain.   promethazine 25 MG tablet Commonly known as: PHENERGAN Take 1 tablet (25 mg total) by mouth every 8 (eight) hours as needed for nausea or vomiting.   traMADol 50 MG tablet Commonly known as: ULTRAM Take 50 mg by mouth every 6 (six) hours as needed for moderate pain.               Discharge Care Instructions  (From admission, onward)           Start     Ordered   06/12/21 0000  Discharge wound care:       Comments: Okay to shower. Do not apply salves or appointments to incision. No heavy lifting with the upper extremities greater than  10 pounds. May resume driving when not requiring pain medication and patient feels comfortable with doing so.   06/12/21 1703             Signed: Shary Key  06/12/2021, 5:04 PM

## 2021-06-12 NOTE — Transfer of Care (Signed)
Immediate Anesthesia Transfer of Care Note  Patient: Kathy Coleman  Procedure(s) Performed: Removal of T10 screw with Metrx (Spine Thoracic)  Patient Location: PACU  Anesthesia Type:General  Level of Consciousness: awake, alert , oriented and patient cooperative  Airway & Oxygen Therapy: Patient Spontanous Breathing and Patient connected to face mask oxygen  Post-op Assessment: Report given to RN, Post -op Vital signs reviewed and stable and Patient moving all extremities  Post vital signs: Reviewed and stable  Last Vitals:  Vitals Value Taken Time  BP 137/68 06/12/21 1251  Temp    Pulse 74 06/12/21 1251  Resp 16 06/12/21 1251  SpO2 99 % 06/12/21 1251  Vitals shown include unvalidated device data.  Last Pain:  Vitals:   06/12/21 0856  TempSrc:   PainSc: 4          Complications: No notable events documented.

## 2021-06-12 NOTE — Op Note (Signed)
Date of surgery: 06/12/2021 Preoperative diagnosis: Thoracic pseudoarthrosis T10-T11 Postoperative diagnosis: Same Procedure: Removal of T10 pedicle screws with Metrix retractor Surgeon: Barnett Abu Anesthesia: General endotracheal Indications: Kathy Coleman is a 65 year old individual who underwent a T10 to pelvis fusion over a period of years.  Her last fusion included the thoracolumbar junction and she has fused from T11 solidly through the sacrum.  At T10-T11 however she developed a pseudoarthrosis.  There is loosening of the hardware despite the augmentation that was placed there.  The plan is now to remove the loose hardware at the T10 level.  Procedure: Patient was brought to the operating room supine on the stretcher.  After the smooth induction of general endotracheal anesthesia, she was carefully turned prone.  The back was prepped with alcohol DuraPrep and draped in a sterile fashion.  Fluoroscopic guidance was used to localize the heads of the T10 screws.  Skin was marked in this area and was infiltrated with 10 cc of half percent Marcaine mixed 50-50 with 1% lidocaine with epinephrine.  Small vertical incision was made over the chosen area and that K wire was passed to the screw head.  Then the fascia was opened in a vertical fashion down to the wire.  A series of dilators were then used to open up the space and place a Metrix retractor a 3 cm long retractor measuring 18 mm in diameter was used.  The soft tissues was cleared from the screw head and the screw Was removed.  Then using a metal cutting bur we cut the rod just below the placement into the screw.  Metal shavings were collected with Hise volumes of irrigation while the drilling was occurring.  The surrounding tissues were then cleared with a monopolar cautery to remove all the remaining kneeing shavings from the metal cutting bur.  The screw was then removed.  The tip was noted to be broken inside the vertebrae as was expected from the  CT scan images.  This was first done on the left side and then it was repeated on the right side.  After all the metal filing material had been removed and the tissues were checked for hemostasis 10 cc of half percent Marcaine was injected into each of the incisions and the fascia surrounding all the tissue.  Then the deep fascia was closed with 3-0 Vicryl in interrupted fashion and 3-0 Vicryl was used in subcuticular skin Dermabond was placed on the skin blood loss was less than 10 cc.

## 2021-06-13 ENCOUNTER — Encounter (HOSPITAL_COMMUNITY): Payer: Self-pay | Admitting: Neurological Surgery

## 2021-07-08 ENCOUNTER — Encounter: Payer: Medicare FFS | Admitting: Podiatry

## 2021-07-09 ENCOUNTER — Ambulatory Visit: Payer: Medicare FFS | Admitting: Podiatry

## 2021-07-09 ENCOUNTER — Encounter: Payer: Self-pay | Admitting: Podiatry

## 2021-07-09 ENCOUNTER — Other Ambulatory Visit: Payer: Self-pay

## 2021-07-09 DIAGNOSIS — B351 Tinea unguium: Secondary | ICD-10-CM

## 2021-07-09 DIAGNOSIS — L600 Ingrowing nail: Secondary | ICD-10-CM | POA: Diagnosis not present

## 2021-07-09 NOTE — Progress Notes (Signed)
Subjective:  Patient ID: Kathy Coleman, female    DOB: 1955/12/09,   MRN: [id number]  No chief complaint on file.   66 y.o. female presents for concern of infected left great toenail. She has been in Dr. Leigh Aurora care and is s/p first MPJ fusion and pan met resection with hammertoe repair in April. She has been having trouble with her left great toenail relates she pulled up the nail on her covers two weeks ago and relates they thought they saw some pus and were concerned . Denies any other pedal complaints. Denies n/v/f/c.   Past Medical History:  Diagnosis Date   Arthritis    Constipation    takes Miralax nightly   GERD (gastroesophageal reflux disease)    takes Zantac daily   History of blood transfusion    History of kidney stones    Hyperlipidemia    takes Atorvastatin daily   Hypertension    takes Lisinopril daily   Osteoarthritis    left hip   Osteoarthritis of left knee    PONV (postoperative nausea and vomiting)    in the past did not have with last surgery in december; did well w/scopalamine patch    Objective:  Physical Exam: Vascular: DP/PT pulses 2/4 bilateral. CFT <3 seconds. Normal hair growth on digits. No edema.  Skin. No lacerations or abrasions bilateral feet. Left hallux nail thickened discolored elongated and lifted distally. No erythema edema or purulence noted.  Musculoskeletal: MMT 5/5 bilateral lower extremities in DF, PF, Inversion and Eversion. Deceased ROM in DF of ankle joint. No tenderness to nail.  Neurological: Sensation intact to light touch.   Assessment:   1. Ingrown left greater toenail   2. Dermatophytosis of nail      Plan:  Patient was evaluated and treated and all questions answered. Toe was evaluated and appears to have a little lifting of the nail distally. Nail was debrided back to attached area without incident. Small area of nail bed exposed. Neopsorin and bandaid applied.  No antibiotics necessary at this time.  Did discuss  if any worsening redness, pain or swelling to call and we can start an antibiotic.  Patient to follow-up as needed.     Lorenda Peck, DPM

## 2021-07-17 ENCOUNTER — Encounter: Payer: Medicare FFS | Admitting: Podiatry

## 2021-07-31 ENCOUNTER — Encounter: Payer: Medicare FFS | Admitting: Podiatry

## 2022-07-24 DIAGNOSIS — H6691 Otitis media, unspecified, right ear: Secondary | ICD-10-CM | POA: Diagnosis not present

## 2022-07-24 DIAGNOSIS — H1031 Unspecified acute conjunctivitis, right eye: Secondary | ICD-10-CM | POA: Diagnosis not present

## 2022-08-07 ENCOUNTER — Telehealth: Payer: Self-pay | Admitting: *Deleted

## 2022-08-07 ENCOUNTER — Ambulatory Visit: Payer: Medicare HMO | Admitting: Internal Medicine

## 2022-08-07 ENCOUNTER — Encounter: Payer: Self-pay | Admitting: *Deleted

## 2022-08-07 ENCOUNTER — Other Ambulatory Visit: Payer: Self-pay | Admitting: *Deleted

## 2022-08-07 ENCOUNTER — Encounter: Payer: Self-pay | Admitting: Internal Medicine

## 2022-08-07 VITALS — BP 115/73 | HR 68 | Temp 98.1°F | Ht 66.0 in | Wt 188.4 lb

## 2022-08-07 DIAGNOSIS — R1310 Dysphagia, unspecified: Secondary | ICD-10-CM

## 2022-08-07 NOTE — Telephone Encounter (Signed)
PA approved via cohere. Authorization #[id number], dos: 08/14/2022 - 10/13/2022

## 2022-08-07 NOTE — Progress Notes (Unsigned)
Primary Care Physician:  Celene Squibb, MD Primary Gastroenterologist:  Dr. Gala Romney  Pre-Procedure History & Physical: HPI:  Kathy Coleman is a 67 y.o. female here for evaluation of recurrent esophageal dysphagia she was dilated by me 2017 and 2021 with good results that lasted 2 to 3 years.  Now with recurrent dysphagia was on omeprazole; more recently switched to Protonix 40 mg-takes right at bedtime.  Does have typical reflux symptoms that are now controlled on Protonix.  It is notable that she is tolerated a relatively large bore [facility] each time she has been dilated with good results.  Prior bx's negative for EOE.  It is notable son has EOE.  Past Medical History:  Diagnosis Date   Arthritis    Constipation    takes Miralax nightly   GERD (gastroesophageal reflux disease)    takes Zantac daily   History of blood transfusion    History of kidney stones    Hyperlipidemia    takes Atorvastatin daily   Hypertension    takes Lisinopril daily   Osteoarthritis    left hip   Osteoarthritis of left knee    PONV (postoperative nausea and vomiting)    in the past did not have with last surgery in december; did well w/scopalamine patch    Past Surgical History:  Procedure Laterality Date   ABDOMINAL HYSTERECTOMY     ANTERIOR LAT LUMBAR FUSION N/A 03/14/2019   Procedure: THORACIC TWELVE TO LUMBAR ONE ANTEROLATERAL DECOMPRESSION;  Surgeon: Kristeen Miss, MD;  Location: [place];  Service: Neurosurgery;  Laterality: N/A;   APPLICATION OF ROBOTIC ASSISTANCE FOR SPINAL PROCEDURE N/A 03/14/2019   Procedure: APPLICATION OF ROBOTIC ASSISTANCE FOR SPINAL PROCEDURE;  Surgeon: Kristeen Miss, MD;  Location: [place];  Service: Neurosurgery;  Laterality: N/A;   BACK SURGERY     1998  , 2010  18 LOW BACK     BIOPSY  02/13/2016   Procedure: BIOPSY;  Surgeon: Daneil Dolin, MD;  [facility];  Service: Endoscopy;;  Gastric and Esophageal biopsies   BIOPSY  08/17/2019   Procedure: BIOPSY;   Surgeon: Daneil Dolin, MD;  [facility];  Service: Endoscopy;;   CHOLECYSTECTOMY     COLONOSCOPY  03/24/2006   Dr.- a couple of anal papillae, o/w normal rectum with L sided diverticula, the remainder of the colonic mucosa appeared normal.    COLONOSCOPY N/A 02/13/2016   Dr. Gala Romney: diverticulosis, melanosis coli    ESOPHAGOGASTRODUODENOSCOPY  03/24/2006   Dr.- normal esophagus. a couple of antral erosions, o/w normal stomach, patent pylorus, normal D1,D2   ESOPHAGOGASTRODUODENOSCOPY N/A 02/13/2016   Dr. Gala Romney: erythematous mucosa, normal D2, empiric dilation    ESOPHAGOGASTRODUODENOSCOPY (EGD) WITH PROPOFOL N/A 08/17/2019   Procedure: ESOPHAGOGASTRODUODENOSCOPY (EGD) WITH PROPOFOL;  Surgeon: Daneil Dolin, MD;  [facility];  Service: Endoscopy;  Laterality: N/A;  9:15am   FOOT SURGERY     RIGHT  2014   HARDWARE REMOVAL N/A 06/12/2021   Procedure: Removal of T10 screw with Metrx;  Surgeon: Kristeen Miss, MD;  Location: [place];  Service: Neurosurgery;  Laterality: N/A;  3C/RM 20   LUMBAR PERCUTANEOUS PEDICLE SCREW 1 LEVEL N/A 03/14/2019   Procedure: [id number] GUIDED PEDICLE SCREWS THORACIC TEN TO Lumbar THREE;  Surgeon: Kristeen Miss, MD;  Location: [place];  Service: Neurosurgery;  Laterality: N/A;   MALONEY DILATION N/A 02/13/2016   Procedure: Venia Minks DILATION;  Surgeon: Daneil Dolin, MD;  [facility];  Service: Endoscopy;  Laterality: N/A;   MALONEY DILATION N/A 08/17/2019   Procedure: Venia Minks DILATION;  Surgeon: Daneil Dolin, MD;  [facility];  Service: Endoscopy;  Laterality: N/A;   NECK SURGERY     2005, 2010   TOTAL HIP ARTHROPLASTY Right 05/13/2015   Procedure: TOTAL HIP ARTHROPLASTY ANTERIOR APPROACH;  Surgeon: Frederik Pear, MD;  Location: [place];  Service: Orthopedics;  Laterality: Right;   TOTAL HIP ARTHROPLASTY Left 06/15/2016   Procedure: TOTAL HIP ARTHROPLASTY ANTERIOR APPROACH;  Surgeon: Frederik Pear, MD;  Location: [place];   Service: Orthopedics;  Laterality: Left;   TOTAL KNEE ARTHROPLASTY Right 06/24/2015   Procedure: TOTAL KNEE ARTHROPLASTY;  Surgeon: Frederik Pear, MD;  Location: [place];  Service: Orthopedics;  Laterality: Right;   TOTAL KNEE ARTHROPLASTY Left 06/21/2017   Procedure: LEFT TOTAL KNEE ARTHROPLASTY;  Surgeon: Frederik Pear, MD;  Location: [place];  Service: Orthopedics;  Laterality: Left;    Prior to Admission medications   Medication Sig Start Date End Date Taking? Authorizing Provider  atorvastatin (LIPITOR) 40 MG tablet Take 40 mg by mouth at bedtime.   Yes [provider]  celecoxib (CELEBREX) 200 MG capsule Take 200 mg by mouth in the morning.   Yes [provider]  fexofenadine (ALLEGRA) 180 MG tablet Take 180 mg by mouth daily.   Yes [provider]  gabapentin (NEURONTIN) 300 MG capsule Take 300 mg by mouth 3 (three) times daily.   Yes [provider]  HYDROcodone-acetaminophen (NORCO/VICODIN) 5-325 MG tablet Take 1 tablet by mouth every 4 (four) hours as needed. 09/24/19  Yes Idol, Almyra Free, PA-C  lisinopril-hydrochlorothiazide (ZESTORETIC) 20-25 MG tablet Take 1 tablet by mouth daily.   Yes [provider]  oxyCODONE-acetaminophen (PERCOCET/ROXICET) 5-325 MG tablet Take 1 tablet by mouth every 8 (eight) hours as needed for severe pain. 11/14/20  Yes Trula Slade, DPM  pantoprazole (PROTONIX) 40 MG tablet Take 1 tablet by mouth daily.   Yes [provider]  traMADol (ULTRAM) 50 MG tablet Take 50 mg by mouth every 6 (six) hours as needed for moderate pain.   Yes [provider]    Allergies as of 08/07/2022   (No Known Allergies)    Family History  Problem Relation Age of Onset   Hypertension Mother    COPD Mother    Diabetes Mother    Heart disease Father    Diabetes Father    Hypertension Father     Social History   Socioeconomic History   Marital status: Married    Spouse name: Not on file   Number of children:  Not on file   Years of education: Not on file   Highest education level: Not on file  Occupational History   Not on file  Tobacco Use   Smoking status: Former    Types: Cigarettes    Quit date: 06/18/1981    Years since quitting: 41.1   Smokeless tobacco: Never   Tobacco comments:    Quit x 30 years  Vaping Use   Vaping Use: Never used  Substance and Sexual Activity   Alcohol use: No    Alcohol/week: 0.0 standard drinks of alcohol   Drug use: No   Sexual activity: Yes  Other Topics Concern   Not on file  Social History Narrative   Not on file   Social Determinants of Health   Financial Resource Strain: Not on file  Food Insecurity: Not on file  Transportation Needs: Not  on file  Physical Activity: Not on file  Stress: Not on file  Social Connections: Not on file  Intimate Partner Violence: Not on file    Review of Systems: See HPI, otherwise negative ROS  Physical Exam: BP 115/73 (BP Location: Right Arm, Patient Position: Sitting, Cuff Size: Large)   Pulse 68   Temp 98.1 F (36.7 C) (Oral)   Ht 5' 6"$  (1.676 m)   Wt 188 lb 6.4 oz (85.5 kg)   SpO2 96%   BMI 30.41 kg/m  General:   Alert,  Well-developed, well-nourished, pleasant and cooperative in NAD Neck:  Supple; no masses or thyromegaly. No significant cervical adenopathy. Lungs:  Clear throughout to auscultation.   No wheezes, crackles, or rhonchi. No acute distress. Heart:  Regular rate and rhythm; no murmurs, clicks, rubs,  or gallops. Abdomen: Non-distended, normal bowel sounds.  Soft and nontender without appreciable mass or hepatosplenomegaly.    Impression/Plan: 67 year old lady with longstanding GERD well-controlled now with recurrent esophageal dysphagia.  Normal appearing tubular esophagus on prior upper Endo's.  Biopsies negative for EOE.  She does get years or so of relief after empiric passage of a large bore [facility] dilator. Could have a mucosal ring, web or underlying motility disorder.  Son  with a EOE but biopsies previously in this nice lady were negative for that entity.  Practically speaking, the next best step is to perform a repeat EGD size things up and perform a bougie dilation as feasible/appropriate. Barium study not likely to add much as discussed.  Recommendations:  As discussed, Protonix is best taken 30 minutes before breakfast every day  We will go ahead and set up an upper endoscopy with esophageal dilation (ASA 2).  Recurrent dysphagia.  The risks, benefits, limitations, alternatives and imponderables have been reviewed with the patient. Potential for esophageal dilation, biopsy, etc. have also been reviewed.  Questions have been answered. All parties agreeable.   Screening colonoscopy due 2027  Further recommendations to follow.       Notice: This dictation was prepared with Dragon dictation along with smaller phrase technology. Any transcriptional errors that result from this process are unintentional and may not be corrected upon review.

## 2022-08-07 NOTE — Patient Instructions (Signed)
It was good to see you again today!  As discussed, Protonix is best taken 30 minutes before breakfast every day  We will go ahead and set up an upper endoscopy with esophageal dilation (ASA 2).  Recurrent dysphagia.  Screening colonoscopy due 2027  Further recommendations to follow.

## 2022-08-07 NOTE — H&P (View-Only) (Signed)
Primary Care Physician:  Celene Squibb, MD Primary Gastroenterologist:  Dr. Gala Romney  Pre-Procedure History & Physical: HPI:  Kathy Coleman is a 67 y.o. female here for evaluation of recurrent esophageal dysphagia she was dilated by me 2017 and 2021 with good results that lasted 2 to 3 years.  Now with recurrent dysphagia was on omeprazole; more recently switched to Protonix 40 mg-takes right at bedtime.  Does have typical reflux symptoms that are now controlled on Protonix.  It is notable that she is tolerated a relatively large bore [facility] each time she has been dilated with good results.  Prior bx's negative for EOE.  It is notable son has EOE.  Past Medical History:  Diagnosis Date   Arthritis    Constipation    takes Miralax nightly   GERD (gastroesophageal reflux disease)    takes Zantac daily   History of blood transfusion    History of kidney stones    Hyperlipidemia    takes Atorvastatin daily   Hypertension    takes Lisinopril daily   Osteoarthritis    left hip   Osteoarthritis of left knee    PONV (postoperative nausea and vomiting)    in the past did not have with last surgery in december; did well w/scopalamine patch    Past Surgical History:  Procedure Laterality Date   ABDOMINAL HYSTERECTOMY     ANTERIOR LAT LUMBAR FUSION N/A 03/14/2019   Procedure: THORACIC TWELVE TO LUMBAR ONE ANTEROLATERAL DECOMPRESSION;  Surgeon: Kristeen Miss, MD;  Location: [place];  Service: Neurosurgery;  Laterality: N/A;   APPLICATION OF ROBOTIC ASSISTANCE FOR SPINAL PROCEDURE N/A 03/14/2019   Procedure: APPLICATION OF ROBOTIC ASSISTANCE FOR SPINAL PROCEDURE;  Surgeon: Kristeen Miss, MD;  Location: [place];  Service: Neurosurgery;  Laterality: N/A;   BACK SURGERY     1998  , 2010  17 LOW BACK     BIOPSY  02/13/2016   Procedure: BIOPSY;  Surgeon: Daneil Dolin, MD;  [facility];  Service: Endoscopy;;  Gastric and Esophageal biopsies   BIOPSY  08/17/2019   Procedure: BIOPSY;   Surgeon: Daneil Dolin, MD;  [facility];  Service: Endoscopy;;   CHOLECYSTECTOMY     COLONOSCOPY  03/24/2006   Dr.- a couple of anal papillae, o/w normal rectum with L sided diverticula, the remainder of the colonic mucosa appeared normal.    COLONOSCOPY N/A 02/13/2016   Dr. Gala Romney: diverticulosis, melanosis coli    ESOPHAGOGASTRODUODENOSCOPY  03/24/2006   Dr.- normal esophagus. a couple of antral erosions, o/w normal stomach, patent pylorus, normal D1,D2   ESOPHAGOGASTRODUODENOSCOPY N/A 02/13/2016   Dr. Gala Romney: erythematous mucosa, normal D2, empiric dilation    ESOPHAGOGASTRODUODENOSCOPY (EGD) WITH PROPOFOL N/A 08/17/2019   Procedure: ESOPHAGOGASTRODUODENOSCOPY (EGD) WITH PROPOFOL;  Surgeon: Daneil Dolin, MD;  [facility];  Service: Endoscopy;  Laterality: N/A;  9:15am   FOOT SURGERY     RIGHT  2014   HARDWARE REMOVAL N/A 06/12/2021   Procedure: Removal of T10 screw with Metrx;  Surgeon: Kristeen Miss, MD;  Location: [place];  Service: Neurosurgery;  Laterality: N/A;  3C/RM 20   LUMBAR PERCUTANEOUS PEDICLE SCREW 1 LEVEL N/A 03/14/2019   Procedure: [id number] GUIDED PEDICLE SCREWS THORACIC TEN TO Lumbar THREE;  Surgeon: Kristeen Miss, MD;  Location: [place];  Service: Neurosurgery;  Laterality: N/A;   MALONEY DILATION N/A 02/13/2016   Procedure: Venia Minks DILATION;  Surgeon: Daneil Dolin, MD;  [facility];  Service: Endoscopy;  Laterality: N/A;   MALONEY DILATION N/A 08/17/2019   Procedure: Venia Minks DILATION;  Surgeon: Daneil Dolin, MD;  [facility];  Service: Endoscopy;  Laterality: N/A;   NECK SURGERY     2005, 2010   TOTAL HIP ARTHROPLASTY Right 05/13/2015   Procedure: TOTAL HIP ARTHROPLASTY ANTERIOR APPROACH;  Surgeon: Frederik Pear, MD;  Location: [place];  Service: Orthopedics;  Laterality: Right;   TOTAL HIP ARTHROPLASTY Left 06/15/2016   Procedure: TOTAL HIP ARTHROPLASTY ANTERIOR APPROACH;  Surgeon: Frederik Pear, MD;  Location: [place];   Service: Orthopedics;  Laterality: Left;   TOTAL KNEE ARTHROPLASTY Right 06/24/2015   Procedure: TOTAL KNEE ARTHROPLASTY;  Surgeon: Frederik Pear, MD;  Location: [place];  Service: Orthopedics;  Laterality: Right;   TOTAL KNEE ARTHROPLASTY Left 06/21/2017   Procedure: LEFT TOTAL KNEE ARTHROPLASTY;  Surgeon: Frederik Pear, MD;  Location: [place];  Service: Orthopedics;  Laterality: Left;    Prior to Admission medications   Medication Sig Start Date End Date Taking? Authorizing Provider  atorvastatin (LIPITOR) 40 MG tablet Take 40 mg by mouth at bedtime.   Yes [provider]  celecoxib (CELEBREX) 200 MG capsule Take 200 mg by mouth in the morning.   Yes [provider]  fexofenadine (ALLEGRA) 180 MG tablet Take 180 mg by mouth daily.   Yes [provider]  gabapentin (NEURONTIN) 300 MG capsule Take 300 mg by mouth 3 (three) times daily.   Yes [provider]  HYDROcodone-acetaminophen (NORCO/VICODIN) 5-325 MG tablet Take 1 tablet by mouth every 4 (four) hours as needed. 09/24/19  Yes Idol, Almyra Free, PA-C  lisinopril-hydrochlorothiazide (ZESTORETIC) 20-25 MG tablet Take 1 tablet by mouth daily.   Yes [provider]  oxyCODONE-acetaminophen (PERCOCET/ROXICET) 5-325 MG tablet Take 1 tablet by mouth every 8 (eight) hours as needed for severe pain. 11/14/20  Yes Trula Slade, DPM  pantoprazole (PROTONIX) 40 MG tablet Take 1 tablet by mouth daily.   Yes [provider]  traMADol (ULTRAM) 50 MG tablet Take 50 mg by mouth every 6 (six) hours as needed for moderate pain.   Yes [provider]    Allergies as of 08/07/2022   (No Known Allergies)    Family History  Problem Relation Age of Onset   Hypertension Mother    COPD Mother    Diabetes Mother    Heart disease Father    Diabetes Father    Hypertension Father     Social History   Socioeconomic History   Marital status: Married    Spouse name: Not on file   Number of children:  Not on file   Years of education: Not on file   Highest education level: Not on file  Occupational History   Not on file  Tobacco Use   Smoking status: Former    Types: Cigarettes    Quit date: 06/18/1981    Years since quitting: 41.1   Smokeless tobacco: Never   Tobacco comments:    Quit x 30 years  Vaping Use   Vaping Use: Never used  Substance and Sexual Activity   Alcohol use: No    Alcohol/week: 0.0 standard drinks of alcohol   Drug use: No   Sexual activity: Yes  Other Topics Concern   Not on file  Social History Narrative   Not on file   Social Determinants of Health   Financial Resource Strain: Not on file  Food Insecurity: Not on file  Transportation Needs: Not  on file  Physical Activity: Not on file  Stress: Not on file  Social Connections: Not on file  Intimate Partner Violence: Not on file    Review of Systems: See HPI, otherwise negative ROS  Physical Exam: BP 115/73 (BP Location: Right Arm, Patient Position: Sitting, Cuff Size: Large)   Pulse 68   Temp 98.1 F (36.7 C) (Oral)   Ht 5' 6"$  (1.676 m)   Wt 188 lb 6.4 oz (85.5 kg)   SpO2 96%   BMI 30.41 kg/m  General:   Alert,  Well-developed, well-nourished, pleasant and cooperative in NAD Neck:  Supple; no masses or thyromegaly. No significant cervical adenopathy. Lungs:  Clear throughout to auscultation.   No wheezes, crackles, or rhonchi. No acute distress. Heart:  Regular rate and rhythm; no murmurs, clicks, rubs,  or gallops. Abdomen: Non-distended, normal bowel sounds.  Soft and nontender without appreciable mass or hepatosplenomegaly.    Impression/Plan: 67 year old lady with longstanding GERD well-controlled now with recurrent esophageal dysphagia.  Normal appearing tubular esophagus on prior upper Endo's.  Biopsies negative for EOE.  She does get years or so of relief after empiric passage of a large bore [facility] dilator. Could have a mucosal ring, web or underlying motility disorder.  Son  with a EOE but biopsies previously in this nice lady were negative for that entity.  Practically speaking, the next best step is to perform a repeat EGD size things up and perform a bougie dilation as feasible/appropriate. Barium study not likely to add much as discussed.  Recommendations:  As discussed, Protonix is best taken 30 minutes before breakfast every day  We will go ahead and set up an upper endoscopy with esophageal dilation (ASA 2).  Recurrent dysphagia.  The risks, benefits, limitations, alternatives and imponderables have been reviewed with the patient. Potential for esophageal dilation, biopsy, etc. have also been reviewed.  Questions have been answered. All parties agreeable.   Screening colonoscopy due 2027  Further recommendations to follow.       Notice: This dictation was prepared with Dragon dictation along with smaller phrase technology. Any transcriptional errors that result from this process are unintentional and may not be corrected upon review.

## 2022-08-10 DIAGNOSIS — R1310 Dysphagia, unspecified: Secondary | ICD-10-CM | POA: Diagnosis not present

## 2022-08-11 LAB — BASIC METABOLIC PANEL
BUN/Creatinine Ratio: 13 (ref 12–28)
BUN: 14 mg/dL (ref 8–27)
CO2: 25 mmol/L (ref 20–29)
Calcium: 9.4 mg/dL (ref 8.7–10.3)
Chloride: 103 mmol/L (ref 96–106)
Creatinine, Ser: 1.06 mg/dL — ABNORMAL HIGH (ref 0.57–1.00)
Glucose: 92 mg/dL (ref 70–99)
Potassium: 5 mmol/L (ref 3.5–5.2)
Sodium: 141 mmol/L (ref 134–144)
eGFR: 58 mL/min/{1.73_m2} — ABNORMAL LOW (ref >59)

## 2022-08-12 ENCOUNTER — Encounter (HOSPITAL_COMMUNITY): Payer: Self-pay

## 2022-08-12 ENCOUNTER — Encounter (HOSPITAL_COMMUNITY)
Admission: RE | Admit: 2022-08-12 | Discharge: 2022-08-12 | Disposition: A | Discharge status: Home / Self Care | Payer: Medicare HMO | Source: Ambulatory Visit | Attending: Internal Medicine | Admitting: Internal Medicine

## 2022-08-14 ENCOUNTER — Encounter (HOSPITAL_COMMUNITY): Payer: Self-pay | Admitting: Internal Medicine

## 2022-08-14 ENCOUNTER — Ambulatory Visit (HOSPITAL_COMMUNITY): Payer: Medicare HMO | Admitting: Anesthesiology

## 2022-08-14 ENCOUNTER — Ambulatory Visit (HOSPITAL_BASED_OUTPATIENT_CLINIC_OR_DEPARTMENT_OTHER): Payer: Medicare HMO | Admitting: Anesthesiology

## 2022-08-14 ENCOUNTER — Ambulatory Visit (HOSPITAL_COMMUNITY)
Admission: RE | Admit: 2022-08-14 | Discharge: 2022-08-14 | Disposition: A | Discharge status: Home / Self Care | Payer: Medicare HMO | Attending: Internal Medicine | Admitting: Internal Medicine

## 2022-08-14 ENCOUNTER — Encounter (HOSPITAL_COMMUNITY)
Admission: RE | Disposition: A | Discharge status: Home / Self Care | Payer: Self-pay | Source: Home / Self Care | Attending: Internal Medicine

## 2022-08-14 DIAGNOSIS — I10 Essential (primary) hypertension: Secondary | ICD-10-CM | POA: Insufficient documentation

## 2022-08-14 DIAGNOSIS — Z87891 Personal history of nicotine dependence: Secondary | ICD-10-CM | POA: Insufficient documentation

## 2022-08-14 DIAGNOSIS — M1990 Unspecified osteoarthritis, unspecified site: Secondary | ICD-10-CM | POA: Diagnosis not present

## 2022-08-14 DIAGNOSIS — R1310 Dysphagia, unspecified: Secondary | ICD-10-CM

## 2022-08-14 DIAGNOSIS — K219 Gastro-esophageal reflux disease without esophagitis: Secondary | ICD-10-CM | POA: Diagnosis not present

## 2022-08-14 DIAGNOSIS — E785 Hyperlipidemia, unspecified: Secondary | ICD-10-CM | POA: Insufficient documentation

## 2022-08-14 HISTORY — PX: MALONEY DILATION: SHX5535

## 2022-08-14 HISTORY — PX: ESOPHAGOGASTRODUODENOSCOPY (EGD) WITH PROPOFOL: SHX5813

## 2022-08-14 SURGERY — ESOPHAGOGASTRODUODENOSCOPY (EGD) WITH PROPOFOL
Anesthesia: General

## 2022-08-14 MED ORDER — PROPOFOL 10 MG/ML IV BOLUS
INTRAVENOUS | Status: DC | PRN
  Administered 2022-08-14 (×2): 50 mg via INTRAVENOUS
  Administered 2022-08-14: 100 mg via INTRAVENOUS

## 2022-08-14 MED ORDER — LIDOCAINE HCL (CARDIAC) PF 50 MG/5ML IV SOSY
PREFILLED_SYRINGE | INTRAVENOUS | Status: DC | PRN
  Administered 2022-08-14: 100 mg via INTRAVENOUS

## 2022-08-14 MED ORDER — LACTATED RINGERS IV SOLN: INTRAVENOUS | Status: DC

## 2022-08-14 NOTE — Anesthesia Procedure Notes (Signed)
Date/Time: 08/14/2022 9:24 AM  Performed by: Vista Deck, CRNAPre-anesthesia Checklist: Patient identified, Emergency Drugs available, Suction available, Timeout performed and Patient being monitored Patient Re-evaluated:Patient Re-evaluated prior to induction Oxygen Delivery Method: Nasal Cannula

## 2022-08-14 NOTE — Anesthesia Postprocedure Evaluation (Signed)
Anesthesia Post Note  Patient: Kathy Coleman  Procedure(s) Performed: ESOPHAGOGASTRODUODENOSCOPY (EGD) WITH PROPOFOL [place]  Patient location during evaluation: Phase II Anesthesia Type: General Level of consciousness: awake Pain management: pain level controlled Vital Signs Assessment: post-procedure vital signs reviewed and stable Respiratory status: spontaneous breathing and respiratory function stable Cardiovascular status: blood pressure returned to baseline and stable Postop Assessment: no headache and no apparent nausea or vomiting Anesthetic complications: no Comments: Late entry   No notable events documented.   Last Vitals:  Vitals:   08/14/22 0751 08/14/22 0943  BP: 135/61 112/66  Pulse: 71 79  Resp: 18 16  Temp: 36.7 C 36.4 C  SpO2: 100% 97%    Last Pain:  Vitals:   08/14/22 0943  TempSrc: Oral  PainSc: 0-No pain                 Louann Sjogren

## 2022-08-14 NOTE — Interval H&P Note (Signed)
History and Physical Interval Note:  08/14/2022 9:16 AM  Kathy Coleman  has presented today for surgery, with the diagnosis of dysphagia.  The various methods of treatment have been discussed with the patient and family. After consideration of risks, benefits and other options for treatment, the patient has consented to  Procedure(s) with comments: ESOPHAGOGASTRODUODENOSCOPY (EGD) WITH PROPOFOL (N/A) - 2:30pm, asa 2 MALONEY DILATION (N/A) as a surgical intervention.  The patient's history has been reviewed, patient examined, no change in status, stable for surgery.  I have reviewed the patient's chart and labs.  Questions were answered to the patient's satisfaction.          no change.  EGD with esophageal dilation as feasible/appropriate per plan.  The risks, benefits, limitations, alternatives and imponderables have been reviewed with the patient. Potential for esophageal dilation, biopsy, etc. have also been reviewed.  Questions have been answered. All parties agreeable.

## 2022-08-14 NOTE — Transfer of Care (Signed)
Immediate Anesthesia Transfer of Care Note  Patient: Kathy Coleman  Procedure(s) Performed: ESOPHAGOGASTRODUODENOSCOPY (EGD) WITH PROPOFOL MALONEY DILATION  Patient Location: Short Stay  Anesthesia Type:General  Level of Consciousness: awake and patient cooperative  Airway & Oxygen Therapy: Patient Spontanous Breathing  Post-op Assessment: Report given to RN and Post -op Vital signs reviewed and stable  Post vital signs: Reviewed and stable  Last Vitals:  Vitals Value Taken Time  BP 112/66 0954  Temp 97.6 0954  Pulse 95 0954  Resp 16 0954  SpO2 97 0954    Last Pain:  Vitals:   08/14/22 0943  TempSrc: Oral  PainSc: 0-No pain         Complications: No notable events documented.

## 2022-08-14 NOTE — Discharge Instructions (Signed)
EGD Discharge instructions Please read the instructions outlined below and refer to this sheet in the next few weeks. These discharge instructions provide you with general information on caring for yourself after you leave the hospital. Your doctor may also give you specific instructions. While your treatment has been planned according to the most current medical practices available, unavoidable complications occasionally occur. If you have any problems or questions after discharge, please call your doctor. ACTIVITY You may resume your regular activity but move at a slower pace for the next 24 hours.  Take frequent rest periods for the next 24 hours.  Walking will help expel (get rid of) the air and reduce the bloated feeling in your abdomen.  No driving for 24 hours (because of the anesthesia (medicine) used during the test).  You may shower.  Do not sign any important legal documents or operate any machinery for 24 hours (because of the anesthesia used during the test).  NUTRITION Drink plenty of fluids.  You may resume your normal diet.  Begin with a light meal and progress to your normal diet.  Avoid alcoholic beverages for 24 hours or as instructed by your caregiver.  MEDICATIONS You may resume your normal medications unless your caregiver tells you otherwise.  WHAT YOU CAN EXPECT TODAY You may experience abdominal discomfort such as a feeling of fullness or "gas" pains.  FOLLOW-UP Your doctor will discuss the results of your test with you.  SEEK IMMEDIATE MEDICAL ATTENTION IF ANY OF THE FOLLOWING OCCUR: Excessive nausea (feeling sick to your stomach) and/or vomiting.  Severe abdominal pain and distention (swelling).  Trouble swallowing.  Temperature over 101 F (37.8 C).  Rectal bleeding or vomiting of blood.       Your upper GI tract again appeared normal  Your esophagus was dilated today.  I was able to notch up the dilation further than done previously.   Hopefully,  dilation today will last for a good while.   Continue Protonix 40 mg daily 30 minutes before breakfast   office visit with me in 1 year and as needed   at patient request, I called Shareece Sonnentag at [phone] -  reviewed findings and recommendations

## 2022-08-14 NOTE — Op Note (Signed)
[facility] Patient Name: Kathy Coleman Procedure Date: 08/14/2022 8:58 AM MRN: [id number] Date of Birth: 1955-11-17 Attending MD: Norvel Richards , MD, [id number] CSN: [id number] Age: 67 Admit Type: Outpatient Procedure:                Upper GI endoscopy Indications:              Dysphagia Providers:                Norvel Richards, MD, Lurline Del, RN, Illene Labrador Referring MD:              Medicines:                Propofol per Anesthesia Complications:            No immediate complications. Estimated Blood Loss:     Estimated blood loss: none. Procedure:                Pre-Anesthesia Assessment:                           - Prior to the procedure, a History and Physical                            was performed, and patient medications and                            allergies were reviewed. The patient's tolerance of                            previous anesthesia was also reviewed. The risks                            and benefits of the procedure and the sedation                            options and risks were discussed with the patient.                            All questions were answered, and informed consent                            was obtained. Prior Anticoagulants: The patient has                            taken no anticoagulant or antiplatelet agents. ASA                            Grade Assessment: III - A patient with severe                            systemic disease. After reviewing the risks and  benefits, the patient was deemed in satisfactory                            condition to undergo the procedure.                           After obtaining informed consent, the endoscope was                            passed under direct vision. Throughout the                            procedure, the patient's blood pressure, pulse, and                            oxygen saturations were monitored  continuously. The                            [id number] scope was introduced through the                            mouth, and advanced to the second part of duodenum.                            The upper GI endoscopy was accomplished without                            difficulty. The patient tolerated the procedure                            well. Scope In: 9:31:09 AM Scope Out: 9:39:59 AM Total Procedure Duration: 0 hours 8 minutes 50 seconds  Findings:      The examined esophagus was normal. Gastric cavity empty.      The entire examined stomach was normal.      The duodenal bulb and second portion of the duodenum were normal. The       scope was withdrawn. Dilation was performed with a Maloney dilator with       mild resistance at 6 Fr. The scope was withdrawn. Dilation was       performed with a Maloney dilator with mild resistance at 56 Fr. The       dilation site was examined following endoscope reinsertion and showed no       change. Estimated blood loss: none. Impression:               - Normal esophagus. Dilated.                           - Normal stomach.                           - Normal duodenal bulb and second portion of the                            duodenum.                           -  No specimens collected. Moderate Sedation:      Moderate (conscious) sedation was personally administered by an       anesthesia professional. The following parameters were monitored: oxygen       saturation, heart rate, blood pressure, respiratory rate, EKG, adequacy       of pulmonary ventilation, and response to care. Recommendation:           - Patient has a contact number available for                            emergencies. The signs and symptoms of potential                            delayed complications were discussed with the                            patient. Return to normal activities tomorrow.                            Written discharge instructions were  provided to the                            patient.                           - Advance diet as tolerated.                           - Continue present medications.                           - Return to my office in 1 year. Procedure Code(s):        --- Professional ---                           [phone], Esophagogastroduodenoscopy, flexible,                            transoral; diagnostic, including collection of                            specimen(s) by brushing or washing, when performed                            (separate procedure)                           43450, Dilation of esophagus, by unguided sound or                            bougie, single or multiple passes Diagnosis Code(s):        --- Professional ---                           R13.10, Dysphagia, unspecified CPT copyright 2022 American Medical Association. All rights reserved. The codes documented in this report are preliminary and upon coder  review may  be revised to meet current compliance requirements. Cristopher Estimable. , MD Norvel Richards, MD 08/14/2022 10:32:14 AM This report has been signed electronically. Number of Addenda: 0

## 2022-08-14 NOTE — Anesthesia Preprocedure Evaluation (Signed)
Anesthesia Evaluation  Patient identified by MRN, date of birth, ID band Patient awake    Reviewed: Allergy & Precautions, H&P , NPO status , Patient's Chart, lab work & pertinent test results, reviewed documented beta blocker date and time   History of Anesthesia Complications (+) PONV and history of anesthetic complications  Airway Mallampati: II  TM Distance: >3 FB Neck ROM: full    Dental no notable dental hx.    Pulmonary neg pulmonary ROS, former smoker   Pulmonary exam normal breath sounds clear to auscultation       Cardiovascular Exercise Tolerance: Good hypertension, negative cardio ROS  Rhythm:regular Rate:Normal     Neuro/Psych  Neuromuscular disease negative neurological ROS  negative psych ROS   GI/Hepatic negative GI ROS, Neg liver ROS,GERD  ,,  Endo/Other  negative endocrine ROS    Renal/GU negative Renal ROS  negative genitourinary   Musculoskeletal   Abdominal   Peds  Hematology negative hematology ROS (+) Blood dyscrasia, anemia   Anesthesia Other Findings   Reproductive/Obstetrics negative OB ROS                             Anesthesia Physical Anesthesia Plan  ASA: 2  Anesthesia Plan: General   Post-op Pain Management:    Induction:   PONV Risk Score and Plan: Propofol infusion  Airway Management Planned:   Additional Equipment:   Intra-op Plan:   Post-operative Plan:   Informed Consent: I have reviewed the patients History and Physical, chart, labs and discussed the procedure including the risks, benefits and alternatives for the proposed anesthesia with the patient or authorized representative who has indicated his/her understanding and acceptance.     Dental Advisory Given  Plan Discussed with: CRNA  Anesthesia Plan Comments:        Anesthesia Quick Evaluation

## 2022-08-21 ENCOUNTER — Encounter (HOSPITAL_COMMUNITY): Payer: Self-pay | Admitting: Internal Medicine

## 2022-08-27 DIAGNOSIS — U071 COVID-19: Secondary | ICD-10-CM | POA: Diagnosis not present

## 2022-08-27 DIAGNOSIS — Z20822 Contact with and (suspected) exposure to covid-19: Secondary | ICD-10-CM | POA: Diagnosis not present

## 2022-10-03 DIAGNOSIS — N390 Urinary tract infection, site not specified: Secondary | ICD-10-CM | POA: Diagnosis not present

## 2022-10-03 DIAGNOSIS — R300 Dysuria: Secondary | ICD-10-CM | POA: Diagnosis not present

## 2022-10-03 DIAGNOSIS — H524 Presbyopia: Secondary | ICD-10-CM | POA: Diagnosis not present

## 2022-10-05 DIAGNOSIS — N390 Urinary tract infection, site not specified: Secondary | ICD-10-CM | POA: Diagnosis not present

## 2022-10-05 DIAGNOSIS — R300 Dysuria: Secondary | ICD-10-CM | POA: Diagnosis not present

## 2022-11-16 DIAGNOSIS — Z1231 Encounter for screening mammogram for malignant neoplasm of breast: Secondary | ICD-10-CM | POA: Diagnosis not present

## 2022-12-24 DIAGNOSIS — B9689 Other specified bacterial agents as the cause of diseases classified elsewhere: Secondary | ICD-10-CM | POA: Diagnosis not present

## 2022-12-24 DIAGNOSIS — J019 Acute sinusitis, unspecified: Secondary | ICD-10-CM | POA: Diagnosis not present

## 2023-01-05 DIAGNOSIS — E782 Mixed hyperlipidemia: Secondary | ICD-10-CM | POA: Diagnosis not present

## 2023-01-05 DIAGNOSIS — R7301 Impaired fasting glucose: Secondary | ICD-10-CM | POA: Diagnosis not present

## 2023-01-12 DIAGNOSIS — D509 Iron deficiency anemia, unspecified: Secondary | ICD-10-CM | POA: Diagnosis not present

## 2023-01-12 DIAGNOSIS — K219 Gastro-esophageal reflux disease without esophagitis: Secondary | ICD-10-CM | POA: Diagnosis not present

## 2023-01-12 DIAGNOSIS — I700 Atherosclerosis of aorta: Secondary | ICD-10-CM | POA: Diagnosis not present

## 2023-01-12 DIAGNOSIS — M7741 Metatarsalgia, right foot: Secondary | ICD-10-CM | POA: Diagnosis not present

## 2023-01-12 DIAGNOSIS — M4716 Other spondylosis with myelopathy, lumbar region: Secondary | ICD-10-CM | POA: Diagnosis not present

## 2023-01-12 DIAGNOSIS — R42 Dizziness and giddiness: Secondary | ICD-10-CM | POA: Diagnosis not present

## 2023-01-12 DIAGNOSIS — E875 Hyperkalemia: Secondary | ICD-10-CM | POA: Diagnosis not present

## 2023-01-12 DIAGNOSIS — E782 Mixed hyperlipidemia: Secondary | ICD-10-CM | POA: Diagnosis not present

## 2023-01-12 DIAGNOSIS — I10 Essential (primary) hypertension: Secondary | ICD-10-CM | POA: Diagnosis not present

## 2023-02-12 DIAGNOSIS — I10 Essential (primary) hypertension: Secondary | ICD-10-CM | POA: Diagnosis not present

## 2023-02-12 DIAGNOSIS — Z713 Dietary counseling and surveillance: Secondary | ICD-10-CM | POA: Diagnosis not present

## 2023-02-12 DIAGNOSIS — Z79899 Other long term (current) drug therapy: Secondary | ICD-10-CM | POA: Diagnosis not present

## 2023-02-12 DIAGNOSIS — R42 Dizziness and giddiness: Secondary | ICD-10-CM | POA: Diagnosis not present

## 2023-02-12 DIAGNOSIS — R944 Abnormal results of kidney function studies: Secondary | ICD-10-CM | POA: Diagnosis not present

## 2023-02-15 DIAGNOSIS — R944 Abnormal results of kidney function studies: Secondary | ICD-10-CM | POA: Diagnosis not present

## 2023-02-23 DIAGNOSIS — D485 Neoplasm of uncertain behavior of skin: Secondary | ICD-10-CM | POA: Diagnosis not present

## 2023-02-23 DIAGNOSIS — Z1283 Encounter for screening for malignant neoplasm of skin: Secondary | ICD-10-CM | POA: Diagnosis not present

## 2023-02-23 DIAGNOSIS — L304 Erythema intertrigo: Secondary | ICD-10-CM | POA: Diagnosis not present

## 2023-02-23 DIAGNOSIS — L570 Actinic keratosis: Secondary | ICD-10-CM | POA: Diagnosis not present

## 2023-03-05 ENCOUNTER — Encounter: Payer: Self-pay | Admitting: Internal Medicine

## 2023-03-05 ENCOUNTER — Ambulatory Visit: Payer: Medicare HMO | Admitting: Internal Medicine

## 2023-03-05 VITALS — BP 138/70 | HR 77 | Ht 66.0 in | Wt 177.0 lb

## 2023-03-05 DIAGNOSIS — R42 Dizziness and giddiness: Secondary | ICD-10-CM | POA: Insufficient documentation

## 2023-03-05 DIAGNOSIS — I440 Atrioventricular block, first degree: Secondary | ICD-10-CM

## 2023-03-05 DIAGNOSIS — I10 Essential (primary) hypertension: Secondary | ICD-10-CM | POA: Diagnosis not present

## 2023-03-05 NOTE — Progress Notes (Signed)
Cardiology Office Note  Date: 03/05/2023   ID: Jaza, Onsurez May 11, 1956, MRN [id number]  PCP:  Benita Stabile, MD  Cardiologist:  Marjo Bicker, MD Electrophysiologist:  None   History of Present Illness: Kathy Coleman is a 67 y.o. female known to have HTN was referred to cardiology clinic for evaluation dizziness.   Has ongoing dizziness x 3 months, frequent, occurs in the morning after she takes her blood pressure medication.  Does not check blood pressure before taking the medications usually checks it after taking the medication.  Dizziness never happened in the afternoon or evening at bedtime.  Never had this episode in the past until 3 months ago.  She checked her blood pressure and heart rate during these dizzy spells and was noted to have BP 80 mmHg SBP and HR 110s.  No presyncope or syncope.  No chest pain, DOE, orthopnea, PND or leg swelling.  No prior history of MI/PCI/CABG.  Past Medical History:  Diagnosis Date   Arthritis    Constipation    takes Miralax nightly   GERD (gastroesophageal reflux disease)    takes Zantac daily   History of blood transfusion    History of kidney stones    Hyperlipidemia    takes Atorvastatin daily   Hypertension    takes Lisinopril daily   Osteoarthritis    left hip   Osteoarthritis of left knee    PONV (postoperative nausea and vomiting)    in the past did not have with last surgery in december; did well w/scopalamine patch    Past Surgical History:  Procedure Laterality Date   ABDOMINAL HYSTERECTOMY     ANTERIOR LAT LUMBAR FUSION N/A 03/14/2019   Procedure: THORACIC TWELVE TO LUMBAR ONE ANTEROLATERAL DECOMPRESSION;  Surgeon: Barnett Abu, MD;  Location: [facility];  Service: Neurosurgery;  Laterality: N/A;   APPLICATION OF ROBOTIC ASSISTANCE FOR SPINAL PROCEDURE N/A 03/14/2019   Procedure: APPLICATION OF ROBOTIC ASSISTANCE FOR SPINAL PROCEDURE;  Surgeon: Barnett Abu, MD;  Location: [facility];  Service: Neurosurgery;   Laterality: N/A;   BACK SURGERY     1998  , 2010  18 LOW BACK     BIOPSY  02/13/2016   Procedure: BIOPSY;  Surgeon: Corbin Ade, MD;  [facility]: [facility];  Service: Endoscopy;;  Gastric and Esophageal biopsies   BIOPSY  08/17/2019   Procedure: BIOPSY;  Surgeon: Corbin Ade, MD;  [facility];  Service: Endoscopy;;   CHOLECYSTECTOMY     COLONOSCOPY  03/24/2006   Dr.Rourk- a couple of anal papillae, o/w normal rectum with L sided diverticula, the remainder of the colonic mucosa appeared normal.    COLONOSCOPY N/A 02/13/2016   Dr. Jena Gauss: diverticulosis, melanosis coli    ESOPHAGOGASTRODUODENOSCOPY  03/24/2006   Dr.Rourk- normal esophagus. a couple of antral erosions, o/w normal stomach, patent pylorus, normal D1,D2   ESOPHAGOGASTRODUODENOSCOPY N/A 02/13/2016   Dr. Jena Gauss: erythematous mucosa, normal D2, empiric dilation    ESOPHAGOGASTRODUODENOSCOPY (EGD) WITH PROPOFOL N/A 08/17/2019   Procedure: ESOPHAGOGASTRODUODENOSCOPY (EGD) WITH PROPOFOL;  Surgeon: Corbin Ade, MD;  [facility];  Service: Endoscopy;  Laterality: N/A;  9:15am   ESOPHAGOGASTRODUODENOSCOPY (EGD) WITH PROPOFOL N/A 08/14/2022   Procedure: ESOPHAGOGASTRODUODENOSCOPY (EGD) WITH PROPOFOL;  Surgeon: Corbin Ade, MD;  [facility];  Service: Endoscopy;  Laterality: N/A;  2:30pm, asa 2   FOOT SURGERY     RIGHT  2014   HARDWARE REMOVAL N/A 06/12/2021  Procedure: Removal of T10 screw with Metrx;  Surgeon: Barnett Abu, MD;  Location: [facility] OR;  Service: Neurosurgery;  Laterality: N/A;  3C/RM 20   LUMBAR PERCUTANEOUS PEDICLE SCREW 1 LEVEL N/A 03/14/2019   Procedure: ONGEX GUIDED PEDICLE SCREWS THORACIC TEN TO Lumbar THREE;  Surgeon: Barnett Abu, MD;  Location: [facility];  Service: Neurosurgery;  Laterality: N/A;   MALONEY DILATION N/A 02/13/2016   Procedure: Elease Hashimoto DILATION;  Surgeon: Corbin Ade, MD;  [facility];  Service: Endoscopy;  Laterality: N/A;   MALONEY DILATION N/A  08/17/2019   Procedure: Elease Hashimoto DILATION;  Surgeon: Corbin Ade, MD;  [facility];  Service: Endoscopy;  Laterality: N/A;   MALONEY DILATION N/A 08/14/2022   Procedure: Elease Hashimoto DILATION;  Surgeon: Corbin Ade, MD;  [facility];  Service: Endoscopy;  Laterality: N/A;   NECK SURGERY     2005, 2010   TOTAL HIP ARTHROPLASTY Right 05/13/2015   Procedure: TOTAL HIP ARTHROPLASTY ANTERIOR APPROACH;  Surgeon: Gean Birchwood, MD;  Location: [facility];  Service: Orthopedics;  Laterality: Right;   TOTAL HIP ARTHROPLASTY Left 06/15/2016   Procedure: TOTAL HIP ARTHROPLASTY ANTERIOR APPROACH;  Surgeon: Gean Birchwood, MD;  Location: [facility];  Service: Orthopedics;  Laterality: Left;   TOTAL KNEE ARTHROPLASTY Right 06/24/2015   Procedure: TOTAL KNEE ARTHROPLASTY;  Surgeon: Gean Birchwood, MD;  Location: [facility];  Service: Orthopedics;  Laterality: Right;   TOTAL KNEE ARTHROPLASTY Left 06/21/2017   Procedure: LEFT TOTAL KNEE ARTHROPLASTY;  Surgeon: Gean Birchwood, MD;  Location: [facility];  Service: Orthopedics;  Laterality: Left;    Current Outpatient Medications  Medication Sig Dispense Refill   atorvastatin (LIPITOR) 40 MG tablet Take 40 mg by mouth at bedtime.     celecoxib (CELEBREX) 200 MG capsule Take 200 mg by mouth in the morning.     FEXOFENADINE HCL PO Take 1 tablet by mouth daily.     gabapentin (NEURONTIN) 300 MG capsule Take 300 mg by mouth 3 (three) times daily.     HYDROcodone-acetaminophen (NORCO/VICODIN) 5-325 MG tablet Take 1 tablet by mouth every 4 (four) hours as needed. 10 tablet 0   lisinopril-hydrochlorothiazide (ZESTORETIC) 20-25 MG tablet Take 1 tablet by mouth daily.     pantoprazole (PROTONIX) 40 MG tablet Take 40 mg by mouth daily.     traMADol (ULTRAM) 50 MG tablet Take 50 mg by mouth every 6 (six) hours as needed for moderate pain.     No current facility-administered medications for this visit.   Allergies:  Patient has no known allergies.   Social History: The  patient  reports that she quit smoking about 41 years ago. Her smoking use included cigarettes. She has never used smokeless tobacco. She reports that she does not drink alcohol and does not use drugs.   Family History: The patient's family history includes COPD in her mother; Diabetes in her father and mother; Heart disease in her father; Hypertension in her father and mother.   ROS:  Please see the history of present illness. Otherwise, complete review of systems is positive for none  All other systems are reviewed and negative.   Physical Exam: VS:  BP 138/70   Pulse 77   Ht 5\' 6"  (1.676 m)   Wt 177 lb (80.3 kg)   SpO2 98%   BMI 28.57 kg/m , BMI Body mass index is 28.57 kg/m.  Wt Readings from Last 3 Encounters:  03/05/23 177 lb (80.3 kg)  08/14/22 188 lb  7.9 oz (85.5 kg)  08/07/22 188 lb 6.4 oz (85.5 kg)    General: Patient appears comfortable at rest. HEENT: Conjunctiva and lids normal, oropharynx clear with moist mucosa. Neck: Supple, no elevated JVP or carotid bruits, no thyromegaly. Lungs: Clear to auscultation, nonlabored breathing at rest. Cardiac: Regular rate and rhythm, no S3 or significant systolic murmur, no pericardial rub. Abdomen: Soft, nontender, no hepatomegaly, bowel sounds present, no guarding or rebound. Extremities: No pitting edema, distal pulses 2+. Skin: Warm and dry. Musculoskeletal: No kyphosis. Neuropsychiatric: Alert and oriented x3, affect grossly appropriate.  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 Last  episode was couple weeks ago. She never had this symptom in the past until 3 months ago. -Dizziness episode likely secondary to antihypertensive medication.  Encouraged patient to follow-up with PCP to cut back on the dose and also instructed her to skip lisinopril-HCTZ if BP < 120 mm Hg SBP.  She verbalized understanding. -EKG today in the clinic showed first-degree AV block, PR interval 212 ms but this should not cause dizziness.  This is benign and no intervention is required.      Medication Adjustments/Labs and Tests Ordered: Current medicines are reviewed at length with the patient today.  Concerns regarding medicines are outlined above.    Disposition:  Follow up prn  Signed Kayann Maj Verne Spurr, MD, 03/05/2023 10:53 AM    [facility] at [facility] [address] [place], [place], [place] [address]

## 2023-03-05 NOTE — Patient Instructions (Signed)
Medication Instructions:  Your physician recommends that you continue on your current medications as directed. Please refer to the Current Medication list given to you today.  *If you need a refill on your cardiac medications before your next appointment, please call your pharmacy*   Lab Work: None If you have labs (blood work) drawn today and your tests are completely normal, you will receive your results only by: MyChart Message (if you have MyChart) OR A paper copy in the mail If you have any lab test that is abnormal or we need to change your treatment, we will call you to review the results.   Testing/Procedures: None   Follow-Up: At [facility], you and your health needs are our priority.  As part of our continuing mission to provide you with exceptional heart care, we have created designated Provider Care Teams.  These Care Teams include your primary Cardiologist (physician) and Advanced Practice Providers (APPs -  Physician Assistants and Nurse Practitioners) who all work together to provide you with the care you need, when you need it.  We recommend signing up for the patient portal called "MyChart".  Sign up information is provided on this After Visit Summary.  MyChart is used to connect with patients for Virtual Visits (Telemedicine).  Patients are able to view lab/test results, encounter notes, upcoming appointments, etc.  Non-urgent messages can be sent to your provider as well.   To learn more about what you can do with MyChart, go to [email or web address].    Your next appointment:    Follow up as needed.   Provider:   You may see Vishnu Norton Pastel, MD or the following Advanced Practice Provider on your designated Care Team:   Sharlene Dory, NP     Other Instructions

## 2023-03-24 DIAGNOSIS — R944 Abnormal results of kidney function studies: Secondary | ICD-10-CM | POA: Diagnosis not present

## 2023-03-24 DIAGNOSIS — I10 Essential (primary) hypertension: Secondary | ICD-10-CM | POA: Diagnosis not present

## 2023-03-24 DIAGNOSIS — Z0000 Encounter for general adult medical examination without abnormal findings: Secondary | ICD-10-CM | POA: Diagnosis not present

## 2023-03-24 DIAGNOSIS — Z23 Encounter for immunization: Secondary | ICD-10-CM | POA: Diagnosis not present

## 2023-03-24 DIAGNOSIS — R42 Dizziness and giddiness: Secondary | ICD-10-CM | POA: Diagnosis not present

## 2023-05-25 ENCOUNTER — Encounter: Payer: Self-pay | Admitting: Internal Medicine

## 2023-07-01 DIAGNOSIS — J069 Acute upper respiratory infection, unspecified: Secondary | ICD-10-CM | POA: Diagnosis not present

## 2023-07-06 DIAGNOSIS — R7303 Prediabetes: Secondary | ICD-10-CM | POA: Diagnosis not present

## 2023-07-06 DIAGNOSIS — E782 Mixed hyperlipidemia: Secondary | ICD-10-CM | POA: Diagnosis not present

## 2023-07-15 DIAGNOSIS — R944 Abnormal results of kidney function studies: Secondary | ICD-10-CM | POA: Diagnosis not present

## 2023-07-15 DIAGNOSIS — I700 Atherosclerosis of aorta: Secondary | ICD-10-CM | POA: Diagnosis not present

## 2023-07-15 DIAGNOSIS — R42 Dizziness and giddiness: Secondary | ICD-10-CM | POA: Diagnosis not present

## 2023-07-15 DIAGNOSIS — M5134 Other intervertebral disc degeneration, thoracic region: Secondary | ICD-10-CM | POA: Diagnosis not present

## 2023-07-15 DIAGNOSIS — R7303 Prediabetes: Secondary | ICD-10-CM | POA: Diagnosis not present

## 2023-07-15 DIAGNOSIS — M4716 Other spondylosis with myelopathy, lumbar region: Secondary | ICD-10-CM | POA: Diagnosis not present

## 2023-07-15 DIAGNOSIS — I10 Essential (primary) hypertension: Secondary | ICD-10-CM | POA: Diagnosis not present

## 2023-07-15 DIAGNOSIS — R1310 Dysphagia, unspecified: Secondary | ICD-10-CM | POA: Diagnosis not present

## 2023-07-15 DIAGNOSIS — E782 Mixed hyperlipidemia: Secondary | ICD-10-CM | POA: Diagnosis not present

## 2023-09-29 ENCOUNTER — Other Ambulatory Visit: Payer: Self-pay

## 2023-09-29 ENCOUNTER — Emergency Department (HOSPITAL_COMMUNITY)
Admission: EM | Admit: 2023-09-29 | Discharge: 2023-09-29 | Disposition: A | Discharge status: Home / Self Care | Attending: Emergency Medicine | Admitting: Emergency Medicine

## 2023-09-29 ENCOUNTER — Emergency Department (HOSPITAL_COMMUNITY)

## 2023-09-29 ENCOUNTER — Encounter (HOSPITAL_COMMUNITY): Payer: Self-pay

## 2023-09-29 DIAGNOSIS — S0990XA Unspecified injury of head, initial encounter: Secondary | ICD-10-CM | POA: Diagnosis not present

## 2023-09-29 DIAGNOSIS — I10 Essential (primary) hypertension: Secondary | ICD-10-CM | POA: Insufficient documentation

## 2023-09-29 DIAGNOSIS — M542 Cervicalgia: Secondary | ICD-10-CM | POA: Insufficient documentation

## 2023-09-29 DIAGNOSIS — Z471 Aftercare following joint replacement surgery: Secondary | ICD-10-CM | POA: Diagnosis not present

## 2023-09-29 DIAGNOSIS — S8001XA Contusion of right knee, initial encounter: Secondary | ICD-10-CM | POA: Insufficient documentation

## 2023-09-29 DIAGNOSIS — Y92093 Driveway of other non-institutional residence as the place of occurrence of the external cause: Secondary | ICD-10-CM | POA: Diagnosis not present

## 2023-09-29 DIAGNOSIS — Z96651 Presence of right artificial knee joint: Secondary | ICD-10-CM | POA: Diagnosis not present

## 2023-09-29 DIAGNOSIS — S0182XA Laceration with foreign body of other part of head, initial encounter: Secondary | ICD-10-CM | POA: Diagnosis not present

## 2023-09-29 DIAGNOSIS — M25561 Pain in right knee: Secondary | ICD-10-CM | POA: Diagnosis not present

## 2023-09-29 DIAGNOSIS — M25542 Pain in joints of left hand: Secondary | ICD-10-CM | POA: Diagnosis not present

## 2023-09-29 DIAGNOSIS — Z981 Arthrodesis status: Secondary | ICD-10-CM | POA: Diagnosis not present

## 2023-09-29 DIAGNOSIS — S0181XA Laceration without foreign body of other part of head, initial encounter: Secondary | ICD-10-CM | POA: Insufficient documentation

## 2023-09-29 DIAGNOSIS — Z23 Encounter for immunization: Secondary | ICD-10-CM | POA: Diagnosis not present

## 2023-09-29 DIAGNOSIS — W01198A Fall on same level from slipping, tripping and stumbling with subsequent striking against other object, initial encounter: Secondary | ICD-10-CM | POA: Insufficient documentation

## 2023-09-29 DIAGNOSIS — R519 Headache, unspecified: Secondary | ICD-10-CM | POA: Diagnosis present

## 2023-09-29 DIAGNOSIS — M1812 Unilateral primary osteoarthritis of first carpometacarpal joint, left hand: Secondary | ICD-10-CM | POA: Diagnosis not present

## 2023-09-29 DIAGNOSIS — S60012A Contusion of left thumb without damage to nail, initial encounter: Secondary | ICD-10-CM | POA: Insufficient documentation

## 2023-09-29 DIAGNOSIS — M79642 Pain in left hand: Secondary | ICD-10-CM

## 2023-09-29 DIAGNOSIS — Z79899 Other long term (current) drug therapy: Secondary | ICD-10-CM | POA: Diagnosis not present

## 2023-09-29 DIAGNOSIS — W19XXXA Unspecified fall, initial encounter: Secondary | ICD-10-CM

## 2023-09-29 DIAGNOSIS — S199XXA Unspecified injury of neck, initial encounter: Secondary | ICD-10-CM | POA: Diagnosis not present

## 2023-09-29 DIAGNOSIS — M25461 Effusion, right knee: Secondary | ICD-10-CM | POA: Diagnosis not present

## 2023-09-29 MED ORDER — BACITRACIN ZINC 500 UNIT/GM EX OINT
TOPICAL_OINTMENT | Freq: Once | CUTANEOUS | Status: AC
  Administered 2023-09-29: 1 via TOPICAL
  Filled 2023-09-29: qty 0.9

## 2023-09-29 MED ORDER — LIDOCAINE-EPINEPHRINE (PF) 2 %-1:200000 IJ SOLN
10.0000 mL | Freq: Once | INTRAMUSCULAR | Status: AC
  Administered 2023-09-29: 10 mL via INTRADERMAL
  Filled 2023-09-29: qty 20

## 2023-09-29 MED ORDER — TETANUS-DIPHTH-ACELL PERTUSSIS 5-2.5-18.5 LF-MCG/0.5 IM SUSY
0.5000 mL | PREFILLED_SYRINGE | Freq: Once | INTRAMUSCULAR | Status: AC
  Administered 2023-09-29: 0.5 mL via INTRAMUSCULAR
  Filled 2023-09-29: qty 0.5

## 2023-09-29 MED ORDER — ACETAMINOPHEN 500 MG PO TABS
1000.0000 mg | ORAL_TABLET | Freq: Once | ORAL | Status: AC
  Administered 2023-09-29: 1000 mg via ORAL
  Filled 2023-09-29: qty 2

## 2023-09-29 NOTE — ED Provider Notes (Signed)
 [place] EMERGENCY DEPARTMENT AT [facility] Provider Note   CSN: [id number] Arrival date & time: 09/29/23  1430     History  Chief Complaint  Patient presents with   Fall    Kathy Coleman is a 68 y.o. female with PMH as listed below who arrived via POV following a fall in the driveway. Pt fell forward after tripping on a hitch and hitting her head on a concrete planter.  Has a 4cm laceration to forehead, bleeding controlled.  She reports a headache as well as paraspinal lower neck pain on both sides with no midline pain.  Also reports pain to her left hand/thumb and right knee.  Denies any numbness tingling anywhere.  Denies loss of consciousness.  Does not take blood thinner. Tetanus shot is not up to date.  Otherwise was in her normal state of health  Past Medical History:  Diagnosis Date   Arthritis    Constipation    takes Miralax nightly   GERD (gastroesophageal reflux disease)    takes Zantac daily   History of blood transfusion    History of kidney stones    Hyperlipidemia    takes Atorvastatin daily   Hypertension    takes Lisinopril daily   Osteoarthritis    left hip   Osteoarthritis of left knee    PONV (postoperative nausea and vomiting)    in the past did not have with last surgery in december; did well w/scopalamine patch       Home Medications Prior to Admission medications   Medication Sig Start Date End Date Taking? Authorizing Provider  atorvastatin (LIPITOR) 40 MG tablet Take 40 mg by mouth at bedtime.    [provider]  celecoxib (CELEBREX) 200 MG capsule Take 200 mg by mouth in the morning.    [provider]  FEXOFENADINE HCL PO Take 1 tablet by mouth daily.    [provider]  gabapentin (NEURONTIN) 300 MG capsule Take 300 mg by mouth 3 (three) times daily.    [provider]  HYDROcodone-acetaminophen (NORCO/VICODIN) 5-325 MG tablet Take 1 tablet by mouth every 4 (four) hours as needed. 09/24/19    Burgess Amor, PA-C  lisinopril-hydrochlorothiazide (ZESTORETIC) 20-25 MG tablet Take 1 tablet by mouth daily.    [provider]  pantoprazole (PROTONIX) 40 MG tablet Take 40 mg by mouth daily.    [provider]  traMADol (ULTRAM) 50 MG tablet Take 50 mg by mouth every 6 (six) hours as needed for moderate pain.    [provider]      Allergies    Patient has no known allergies.    Review of Systems   Review of Systems A 10 point review of systems was performed and is negative unless otherwise reported in HPI.  Physical Exam Updated Vital Signs BP (!) 150/74 (BP Location: Right Arm)   Pulse 77   Temp 98.2 F (36.8 C) (Oral)   Resp 16   Ht 5\' 6"  (1.676 m)   Wt 80.3 kg   SpO2 100%   BMI 28.57 kg/m  Physical Exam  PRIMARY SURVEY  Airway Airway intact  Breathing Bilateral breath sounds  Circulation Carotid/femoral pulses 2+ intact bilaterally  GCS E =  4 V =  5 M =  6 Total = 15  Environment All clothes removed      SECONDARY SURVEY  Gen: -NAD  HEENT: -Head: NCAT. Scalp is clear of lacerations or wounds. Skull is clear of deformities  or depressions -Forehead: 4cm hemostatic linear horizontal laceration to center of forehead. No hematoma.  -Midface: Stable -Eyes: No visible injury to eyelids or eye, PERRL, EOMI -Nose: No gross deformities -Mouth: No injuries to lips, tongue or teeth. No trismus or malposition -Ears: No auricular hematoma -Neck: Trachea is midline, no distended neck veins  Chest: -No tenderness, deformities, bruising or crepitus to clavicles or chest -Normal chest expansion -Normal heart sounds, S1/S2 normal, no m/r/g -No wheezes, rales, rhonchi  Abdomen: -No tenderness, bruising or penetrating injury  Pelvis: -Pelvis is stable and non-tender  Extremities: Right Upper Extremity: -No point tenderness, deformity or other signs of injury -Radial pulse intact RUE, cap refill good -Normal sensation -Normal ROM, good  strength Left Upper Extremity: -TTP with mild ecchymosis and mild swelling to L first digit, intact movement/opposition, good cap refill. No TTP over anatomic snuffbox.  -Radial pulse intact LUE, cap refill good -Normal sensation -Normal ROM, good strength Right Lower Extremity: -TTP to anterior lateral Right knee with ecchymoses and mild effusion. No open wounds or bleeding.  -No deformity or other signs of injury -DP intact RLE -Normal sensation -Normal ROM, good strength Left Lower Extremity: -No point tenderness, deformity or other signs of injury -DP intact LLE -Normal sensation -Normal ROM, good strength  Back/Spine: -No midline C, T, or L spine tenderness or step-offs. Paraspinal muscular tenderness to palpation in lower Cervical region/upper Thoracic region.   Other: N/A     ED Results / Procedures / Treatments   Labs (all labs ordered are listed, but only abnormal results are displayed) Labs Reviewed - No data to display  EKG None  Radiology DG Knee Complete 4 Views Right Result Date: 09/29/2023 CLINICAL DATA:  Fall in driveway.  Right knee pain. EXAM: RIGHT KNEE - COMPLETE 4+ VIEW COMPARISON:  None Available. FINDINGS: Status post right knee arthroplasty. No perihardware lucency is seen to indicate hardware failure or loosening. Small joint effusion. Mild subcutaneous fat edema and swelling anterior to the patellar tendon. No acute fracture or dislocation. Mild nonspecific calcifications posterior to the knee, possibly within a Baker's cyst. IMPRESSION: 1. Status post right knee arthroplasty without evidence of hardware failure. 2. Small joint effusion. Electronically Signed   By: Neita Garnet M.D.   On: 09/29/2023 17:20   DG Hand Complete Left Result Date: 09/29/2023 CLINICAL DATA:  Fall.  Pain over the first metacarpal. EXAM: LEFT HAND - COMPLETE 3+ VIEW COMPARISON:  None Available. FINDINGS: Severe thumb carpometacarpal joint space narrowing, subchondral sclerosis,  peripheral osteophytosis, and peripheral chronic ossicles. Moderate fifth and mild second through fourth DIP joint space narrowing. Mild second PIP joint space narrowing. Mild ulnar negative variance. No acute fracture or dislocation. IMPRESSION: 1. Severe thumb carpometacarpal osteoarthritis. 2. No acute fracture. Electronically Signed   By: Neita Garnet M.D.   On: 09/29/2023 17:18   CT Head Wo Contrast Result Date: 09/29/2023 CLINICAL DATA:  Head trauma, minor (Age >= 65y); Neck trauma (Age >= 65y). Larey Seat forward on driveway. Forehead laceration. EXAM: CT HEAD WITHOUT CONTRAST CT CERVICAL SPINE WITHOUT CONTRAST TECHNIQUE: Multidetector CT imaging of the head and cervical spine was performed following the standard protocol without intravenous contrast. Multiplanar CT image reconstructions of the cervical spine were also generated. RADIATION DOSE REDUCTION: This exam was performed according to the departmental dose-optimization program which includes automated exposure control, adjustment of the mA and/or kV according to patient size and/or use of iterative reconstruction technique. COMPARISON:  CT head and cervical spine 09/24/2019 FINDINGS: CT HEAD  FINDINGS Brain: There is no evidence of an acute infarct, intracranial hemorrhage, mass, midline shift, or extra-axial fluid collection. Cerebral volume is normal. The ventricles are normal in size. Vascular: No hyperdense vessel or unexpected calcification. Skull: No acute fracture or suspicious lesion. Sinuses/Orbits: Visualized paranasal sinuses and mastoid air cells are clear. Unremarkable orbits. Other: Forehead laceration. CT CERVICAL SPINE FINDINGS Alignment: Chronic cervical spine straightening. Unchanged grade 1 anterolisthesis of C7 on T1. Skull base and vertebrae: No acute fracture or suspicious lesion. Soft tissues and spinal canal: No prevertebral fluid or swelling. No visible canal hematoma. Disc levels: Remote C3-C7 ACDF with solid arthrodesis  throughout. C3-C6 facet ankylosis. Moderately advanced bilateral facet arthrosis at C7-T1. Upper chest: Clear lung apices. Other: None. IMPRESSION: 1. No evidence of acute intracranial abnormality or cervical spine fracture. 2. Solid C3-C7 ACDF. Electronically Signed   By: Sebastian Ache M.D.   On: 09/29/2023 16:47   CT Cervical Spine Wo Contrast Result Date: 09/29/2023 CLINICAL DATA:  Head trauma, minor (Age >= 65y); Neck trauma (Age >= 65y). Larey Seat forward on driveway. Forehead laceration. EXAM: CT HEAD WITHOUT CONTRAST CT CERVICAL SPINE WITHOUT CONTRAST TECHNIQUE: Multidetector CT imaging of the head and cervical spine was performed following the standard protocol without intravenous contrast. Multiplanar CT image reconstructions of the cervical spine were also generated. RADIATION DOSE REDUCTION: This exam was performed according to the departmental dose-optimization program which includes automated exposure control, adjustment of the mA and/or kV according to patient size and/or use of iterative reconstruction technique. COMPARISON:  CT head and cervical spine 09/24/2019 FINDINGS: CT HEAD FINDINGS Brain: There is no evidence of an acute infarct, intracranial hemorrhage, mass, midline shift, or extra-axial fluid collection. Cerebral volume is normal. The ventricles are normal in size. Vascular: No hyperdense vessel or unexpected calcification. Skull: No acute fracture or suspicious lesion. Sinuses/Orbits: Visualized paranasal sinuses and mastoid air cells are clear. Unremarkable orbits. Other: Forehead laceration. CT CERVICAL SPINE FINDINGS Alignment: Chronic cervical spine straightening. Unchanged grade 1 anterolisthesis of C7 on T1. Skull base and vertebrae: No acute fracture or suspicious lesion. Soft tissues and spinal canal: No prevertebral fluid or swelling. No visible canal hematoma. Disc levels: Remote C3-C7 ACDF with solid arthrodesis throughout. C3-C6 facet ankylosis. Moderately advanced bilateral facet  arthrosis at C7-T1. Upper chest: Clear lung apices. Other: None. IMPRESSION: 1. No evidence of acute intracranial abnormality or cervical spine fracture. 2. Solid C3-C7 ACDF. Electronically Signed   By: Sebastian Ache M.D.   On: 09/29/2023 16:47    Procedures .Laceration Repair  Date/Time: 09/29/2023 6:08 PM  Performed by: Loetta Rough, MD Authorized by: Loetta Rough, MD   Consent:    Consent obtained:  Verbal   Consent given by:  Patient   Risks discussed:  Infection, need for additional repair, nerve damage, poor cosmetic result, poor wound healing, pain, retained foreign body, tendon damage and vascular damage   Alternatives discussed:  No treatment Universal protocol:    Procedure explained and questions answered to patient or proxy's satisfaction: yes     Imaging studies available: yes     Immediately prior to procedure, a time out was called: yes     Patient identity confirmed:  Verbally with patient Anesthesia:    Anesthesia method:  Local infiltration   Local anesthetic:  Lidocaine 2% WITH epi Laceration details:    Location:  Face   Face location:  Forehead   Length (cm):  4   Depth (mm):  10 Pre-procedure details:  Preparation:  Imaging obtained to evaluate for foreign bodies Exploration:    Hemostasis achieved with:  Direct pressure   Imaging obtained comment:  CT   Imaging outcome: foreign body noted     Wound exploration: wound explored through full range of motion and entire depth of wound visualized     Wound extent: foreign bodies/material and muscle damage     Wound extent: areolar tissue not violated, fascia not violated, no nerve damage, no tendon damage, no underlying fracture and no vascular damage     Foreign bodies/material:  Hair in wound   Contaminated: no   Treatment:    Area cleansed with:  Saline   Amount of cleaning:  Extensive   Irrigation solution:  Sterile saline   Irrigation volume:  1000 cc   Visualized foreign bodies/material  removed: yes     Debridement:  None   Undermining:  None   Scar revision: no   Skin repair:    Repair method:  Sutures   Suture size:  5-0   Suture material:  Prolene   Suture technique:  Simple interrupted   Number of sutures:  7 Approximation:    Approximation:  Close Repair type:    Repair type:  Simple Post-procedure details:    Dressing:  Antibiotic ointment and sterile dressing   Procedure completion:  Tolerated well, no immediate complications     Medications Ordered in ED Medications  bacitracin ointment (has no administration in time range)  lidocaine-EPINEPHrine (XYLOCAINE W/EPI) 2 %-1:200000 (PF) injection 10 mL (10 mLs Intradermal Given by Other 09/29/23 1653)  acetaminophen (TYLENOL) tablet 1,000 mg (1,000 mg Oral Given 09/29/23 1703)  Tdap (BOOSTRIX) injection 0.5 mL (0.5 mLs Intramuscular Given 09/29/23 1707)    ED Course/ Medical Decision Making/ A&P                          Medical Decision Making Amount and/or Complexity of Data Reviewed Radiology: ordered. Decision-making details documented in ED Course.  Risk OTC drugs. Prescription drug management.    MDM:    DDX for trauma includes but is not limited to:  -Head Injury such as skull fx or ICH - neg head CT for ICH or skull fx. Does report a headache which improved w/ tylenol. -Spinal Cord or Vertebral injury - no FNDs. No midline spinal TTP but paraspinal in neck. Neg CT C-spine.  -Fractures - no hand or right knee fractures, likely contusion. No anatomic snuffbox tenderness of left hand. Intact thumb opposition. Offered a brace for comfort which she declines.  Patient presents with laceration to forehead. Laceration was cleaned with copious irrigation and repaired.  FB of hair removed, no visualized FB remaining in wound. Motor-neuro-vascular exam intact prior to procedure No evidence of tendon injury on exploration of wound. Motor-neuro-vascular exam intact after procedure  Tetanus: -not UTD  and administered  Advised tylenol, ibuprofen, RICE for extremity injuries.    COUNSELING: Patient is advised on follow up timing for suture removal, to be removed by healthcare professional in 5-7 days.   I discussed the possibility of residual foreign body with patient and that no matter how thorough the search it is still a possibility. I explained to return if patient notices signs of retained FB. I also explained what to look for with regards to infection. The patient agreed to return with any purulent drainage, extending erythema or pain, fever, nausea/vomiting or any other changes.  I also discussed the inevitability of  scarring with the patient. They understand that all lacerations will leave a varying degree of scarring and optimal outcome/cosmetic appearance can never be guaranteed. They also understand the possibility of prompt revision by plastic surgery if desired.   Clinical Course as of 09/29/23 1810  Wed Sep 29, 2023  1708 CT Head Wo Contrast 1. No evidence of acute intracranial abnormality or cervical spine fracture. 2. Solid C3-C7 ACDF.   [HN]  1731 DG Knee Complete 4 Views Right 1. Status post right knee arthroplasty without evidence of hardware failure. 2. Small joint effusion.   [HN]  1731 DG Hand Complete Left 1. Severe thumb carpometacarpal osteoarthritis. 2. No acute fracture.   [HN]    Clinical Course User Index [HN] Loetta Rough, MD    Imaging Studies ordered: I ordered imaging studies including CTH, CT C_spine, L hand XR, R knee XR I independently visualized and interpreted imaging. I agree with the radiologist interpretation  Additional history obtained from chart review, husband at bedside  Reevaluation: After the interventions noted above, I reevaluated the patient and found that they have :improved  Social Determinants of Health: Lives independently  Disposition:  DC w/ discharge instructions/return precautions. All questions answered  to patient's satisfaction.    Co morbidities that complicate the patient evaluation  Past Medical History:  Diagnosis Date   Arthritis    Constipation    takes Miralax nightly   GERD (gastroesophageal reflux disease)    takes Zantac daily   History of blood transfusion    History of kidney stones    Hyperlipidemia    takes Atorvastatin daily   Hypertension    takes Lisinopril daily   Osteoarthritis    left hip   Osteoarthritis of left knee    PONV (postoperative nausea and vomiting)    in the past did not have with last surgery in december; did well w/scopalamine patch     Medicines Meds ordered this encounter  Medications   lidocaine-EPINEPHrine (XYLOCAINE W/EPI) 2 %-1:200000 (PF) injection 10 mL   acetaminophen (TYLENOL) tablet 1,000 mg   Tdap (BOOSTRIX) injection 0.5 mL   bacitracin ointment    I have reviewed the patients home medicines and have made adjustments as needed  Problem List / ED Course: Problem List Items Addressed This Visit   None Visit Diagnoses       Fall in home, initial encounter    -  Primary     Laceration of forehead, initial encounter         Pain of left hand         Contusion of right knee, initial encounter                       This note was created using dictation software, which may contain spelling or grammatical errors.    Loetta Rough, MD 09/29/23 [phone]

## 2023-09-29 NOTE — ED Notes (Signed)
 Pt is A&O x4. Dressing on laceration upon arrival.  Pt stated, "I tripped in driveway. Hitting my head. I was bleeding when I got here. It'll stop then start. My body aches all over."

## 2023-09-29 NOTE — Discharge Instructions (Addendum)
 Thank you for coming to [facility] Emergency Department. You were seen for fall with head injury. We did an exam, labs, and imaging, and these showed a laceration to the forehead which was repaired with 7 sutures. Please have these removed in 5-7 days by a healthcare professional. You can use bacitracin twice per day. Please keep the area clean and dry. You can wash your face as normal but do not scrub the area.   For your hand/knee pain, please utilize rest, ice, compression, and elevation to help prevent swelling and pain. You can alternate taking Tylenol and ibuprofen as needed for pain. You can take 650mg  tylenol (acetaminophen) every 4-6 hours, and 600 mg ibuprofen 3 times a day. If the pain/injuries do not improve within 1-2 weeks, please follow up with your orthopedic surgeon.  Please follow up with your primary care provider within 1 week.   Do not hesitate to return to the ED or call 911 if you experience: -Worsening symptoms -Signs of infection including redness, swelling, pain, pus drainage -Lightheadedness, passing out -Fevers/chills -Anything else that concerns you

## 2023-09-29 NOTE — ED Triage Notes (Signed)
 Pt arrived via POV following a fall in the driveway. Pt fell forward causing 1.5in laceration to forehead, and injuring her neck, left hand and right knee. Bleeding controlled at this time. Dressing placed in Triage. Pt reports Tetanus shot is out of date.

## 2023-10-06 DIAGNOSIS — S0181XD Laceration without foreign body of other part of head, subsequent encounter: Secondary | ICD-10-CM | POA: Diagnosis not present

## 2023-10-06 DIAGNOSIS — W19XXXD Unspecified fall, subsequent encounter: Secondary | ICD-10-CM | POA: Diagnosis not present

## 2023-10-06 DIAGNOSIS — S20211A Contusion of right front wall of thorax, initial encounter: Secondary | ICD-10-CM | POA: Diagnosis not present

## 2023-10-06 DIAGNOSIS — G8929 Other chronic pain: Secondary | ICD-10-CM | POA: Diagnosis not present

## 2023-10-06 DIAGNOSIS — M1812 Unilateral primary osteoarthritis of first carpometacarpal joint, left hand: Secondary | ICD-10-CM | POA: Diagnosis not present

## 2023-12-10 ENCOUNTER — Encounter: Payer: Self-pay | Admitting: Podiatry

## 2023-12-10 ENCOUNTER — Ambulatory Visit: Admitting: Podiatry

## 2023-12-10 DIAGNOSIS — L84 Corns and callosities: Secondary | ICD-10-CM | POA: Diagnosis not present

## 2023-12-10 DIAGNOSIS — M216X1 Other acquired deformities of right foot: Secondary | ICD-10-CM | POA: Diagnosis not present

## 2023-12-10 NOTE — Progress Notes (Signed)
 Subjective:   Patient ID: Kathy Coleman, female   DOB: 68 y.o.   MRN: [id number]   HPI Chief Complaint  Patient presents with   Callouses    RM#12 Patient states has an area under right foot believes has a painful callus worsening over the last 3 months.   68 year old female presents the office with above concerns which are new.  She developed a callus, lesion on the bottom of her right foot.  She does not recall any recent injuries or stepping any foreign objects.  No swelling or redness or any drainage.  No recent treatment.  She has no other concerns today.   Review of Systems  All other systems reviewed and are negative.  Past Medical History:  Diagnosis Date   Arthritis    Constipation    takes Miralax  nightly   GERD (gastroesophageal reflux disease)    takes Zantac daily   History of blood transfusion    History of kidney stones    Hyperlipidemia    takes Atorvastatin  daily   Hypertension    takes Lisinopril  daily   Osteoarthritis    left hip   Osteoarthritis of left knee    PONV (postoperative nausea and vomiting)    in the past did not have with last surgery in december; did well w/scopalamine patch    Past Surgical History:  Procedure Laterality Date   ABDOMINAL HYSTERECTOMY     ANTERIOR LAT LUMBAR FUSION N/A 03/14/2019   Procedure: THORACIC TWELVE TO LUMBAR ONE ANTEROLATERAL DECOMPRESSION;  Surgeon: Elna Haggis, MD;  Location: [facility] OR;  Service: Neurosurgery;  Laterality: N/A;   APPLICATION OF ROBOTIC ASSISTANCE FOR SPINAL PROCEDURE N/A 03/14/2019   Procedure: APPLICATION OF ROBOTIC ASSISTANCE FOR SPINAL PROCEDURE;  Surgeon: Elna Haggis, MD;  Location: [facility] OR;  Service: Neurosurgery;  Laterality: N/A;   BACK SURGERY     1998  , 2010  17 LOW BACK     BIOPSY  02/13/2016   Procedure: BIOPSY;  Surgeon: Suzette Espy, MD;  Location: [facility];  Service: Endoscopy;;  Gastric and Esophageal biopsies   BIOPSY  08/17/2019   Procedure: BIOPSY;  Surgeon: Suzette Espy,  MD;  Location: [facility];  Service: Endoscopy;;   CHOLECYSTECTOMY     COLONOSCOPY  03/24/2006   Dr.Rourk- a couple of anal papillae, o/w normal rectum with L sided diverticula, the remainder of the colonic mucosa appeared normal.    COLONOSCOPY N/A 02/13/2016   Dr. Riley Cheadle: diverticulosis, melanosis coli    ESOPHAGOGASTRODUODENOSCOPY  03/24/2006   Dr.Rourk- normal esophagus. a couple of antral erosions, o/w normal stomach, patent pylorus, normal D1,D2   ESOPHAGOGASTRODUODENOSCOPY N/A 02/13/2016   Dr. Riley Cheadle: erythematous mucosa, normal D2, empiric dilation    ESOPHAGOGASTRODUODENOSCOPY (EGD) WITH PROPOFOL  N/A 08/17/2019   Procedure: ESOPHAGOGASTRODUODENOSCOPY (EGD) WITH PROPOFOL ;  Surgeon: Suzette Espy, MD;  Location: [facility];  Service: Endoscopy;  Laterality: N/A;  9:15am   ESOPHAGOGASTRODUODENOSCOPY (EGD) WITH PROPOFOL  N/A 08/14/2022   Procedure: ESOPHAGOGASTRODUODENOSCOPY (EGD) WITH PROPOFOL ;  Surgeon: Suzette Espy, MD;  Location: [facility];  Service: Endoscopy;  Laterality: N/A;  2:30pm, asa 2   FOOT SURGERY     RIGHT  2014   HARDWARE REMOVAL N/A 06/12/2021   Procedure: Removal of T10 screw with Metrx;  Surgeon: Elna Haggis, MD;  Location: [facility] OR;  Service: Neurosurgery;  Laterality: N/A;  3C/RM 20   LUMBAR PERCUTANEOUS PEDICLE SCREW 1 LEVEL N/A 03/14/2019   Procedure: MAZOR GUIDED PEDICLE SCREWS THORACIC  TEN TO Lumbar THREE;  Surgeon: Elna Haggis, MD;  Location[facility] OR;  Service: Neurosurgery;  Laterality: N/A;   MALONEY DILATION N/A 02/13/2016   Procedure: Londa Rival DILATION;  Surgeon: Suzette Espy, MD;  Location: [facility];  Service: Endoscopy;  Laterality: N/A;   MALONEY DILATION N/A 08/17/2019   Procedure: Londa Rival DILATION;  Surgeon: Suzette Espy, MD;  Location: [facility];  Service: Endoscopy;  Laterality: N/A;   MALONEY DILATION N/A 08/14/2022   Procedure: Londa Rival DILATION;  Surgeon: Suzette Espy, MD;  Location: [facility];  Service: Endoscopy;  Laterality:  N/A;   NECK SURGERY     2005, 2010   TOTAL HIP ARTHROPLASTY Right 05/13/2015   Procedure: TOTAL HIP ARTHROPLASTY ANTERIOR APPROACH;  Surgeon: Wendolyn Hamburger, MD;  Location: [facility] OR;  Service: Orthopedics;  Laterality: Right;   TOTAL HIP ARTHROPLASTY Left 06/15/2016   Procedure: TOTAL HIP ARTHROPLASTY ANTERIOR APPROACH;  Surgeon: Wendolyn Hamburger, MD;  Location: [facility];  Service: Orthopedics;  Laterality: Left;   TOTAL KNEE ARTHROPLASTY Right 06/24/2015   Procedure: TOTAL KNEE ARTHROPLASTY;  Surgeon: Wendolyn Hamburger, MD;  Location: [facility];  Service: Orthopedics;  Laterality: Right;   TOTAL KNEE ARTHROPLASTY Left 06/21/2017   Procedure: LEFT TOTAL KNEE ARTHROPLASTY;  Surgeon: Wendolyn Hamburger, MD;  Location: [facility] OR;  Service: Orthopedics;  Laterality: Left;     Current Outpatient Medications:    atorvastatin  (LIPITOR) 40 MG tablet, Take 40 mg by mouth at bedtime., Disp: , Rfl:    celecoxib  (CELEBREX ) 200 MG capsule, Take 200 mg by mouth in the morning., Disp: , Rfl:    FEXOFENADINE HCL PO, Take 1 tablet by mouth daily., Disp: , Rfl:    gabapentin  (NEURONTIN ) 300 MG capsule, Take 300 mg by mouth 3 (three) times daily., Disp: , Rfl:    HYDROcodone -acetaminophen  (NORCO/VICODIN) 5-325 MG tablet, Take 1 tablet by mouth every 4 (four) hours as needed., Disp: 10 tablet, Rfl: 0   lisinopril -hydrochlorothiazide  (ZESTORETIC ) 20-25 MG tablet, Take 1 tablet by mouth daily., Disp: , Rfl:    pantoprazole  (PROTONIX ) 40 MG tablet, Take 40 mg by mouth daily., Disp: , Rfl:    traMADol  (ULTRAM ) 50 MG tablet, Take 50 mg by mouth every 6 (six) hours as needed for moderate pain., Disp: , Rfl:   No Known Allergies        Objective:  Physical Exam  General: AAO x3, NAD  Dermatological: On the right foot submetatarsal 4 is a thick hyperkeratotic lesion with dried blood present underneath the skin lesion.  There is no underlying ulceration, drainage or any signs of infection.  There is no surrounding erythema, ascending cellulitis.   There is no fluctuation or crepitation or any malodor or any signs of infection noted.  Vascular: Dorsalis Pedis artery and Posterior Tibial artery pedal pulses are 2/4 bilateral with immedate capillary fill time. There is no pain with calf compression, swelling, warmth, erythema.   Neruologic: Grossly intact via light touch bilateral.   Musculoskeletal: Prominent metatarsal noted plantarly.  Atrophy of the fat pad.       Assessment:   68 year old female with hyperkeratotic lesion with prominence of metatarsal     Plan:  -Treatment options discussed including all alternatives, risks, and complications -Etiology of symptoms were discussed - I sharply debrided the thick hyperkeratotic lesion without any complications or bleeding.  The area is preulcerative.  We discussed moisturizer, offloading to help event reoccurrence.  I was going to apply salicylic acid but given that it is  preulcerative I held off on this today. -Monitor for any clinical signs or symptoms of infection and directed to call the office immediately should any occur or go to the ER.  Return if symptoms worsen or fail to improve.  Charity Conch DPM

## 2023-12-17 DIAGNOSIS — Z1231 Encounter for screening mammogram for malignant neoplasm of breast: Secondary | ICD-10-CM | POA: Diagnosis not present

## 2024-01-06 DIAGNOSIS — E782 Mixed hyperlipidemia: Secondary | ICD-10-CM | POA: Diagnosis not present

## 2024-01-06 DIAGNOSIS — R7303 Prediabetes: Secondary | ICD-10-CM | POA: Diagnosis not present

## 2024-01-12 DIAGNOSIS — R1310 Dysphagia, unspecified: Secondary | ICD-10-CM | POA: Diagnosis not present

## 2024-01-12 DIAGNOSIS — M4716 Other spondylosis with myelopathy, lumbar region: Secondary | ICD-10-CM | POA: Diagnosis not present

## 2024-01-12 DIAGNOSIS — I700 Atherosclerosis of aorta: Secondary | ICD-10-CM | POA: Diagnosis not present

## 2024-01-12 DIAGNOSIS — I10 Essential (primary) hypertension: Secondary | ICD-10-CM | POA: Diagnosis not present

## 2024-01-12 DIAGNOSIS — R42 Dizziness and giddiness: Secondary | ICD-10-CM | POA: Diagnosis not present

## 2024-01-12 DIAGNOSIS — E782 Mixed hyperlipidemia: Secondary | ICD-10-CM | POA: Diagnosis not present

## 2024-01-12 DIAGNOSIS — M5134 Other intervertebral disc degeneration, thoracic region: Secondary | ICD-10-CM | POA: Diagnosis not present

## 2024-01-12 DIAGNOSIS — R944 Abnormal results of kidney function studies: Secondary | ICD-10-CM | POA: Diagnosis not present

## 2024-01-12 DIAGNOSIS — M1812 Unilateral primary osteoarthritis of first carpometacarpal joint, left hand: Secondary | ICD-10-CM | POA: Diagnosis not present

## 2024-01-13 ENCOUNTER — Other Ambulatory Visit (HOSPITAL_COMMUNITY): Payer: Self-pay | Admitting: Internal Medicine

## 2024-01-13 DIAGNOSIS — Z13820 Encounter for screening for osteoporosis: Secondary | ICD-10-CM

## 2024-01-25 ENCOUNTER — Other Ambulatory Visit (HOSPITAL_COMMUNITY): Payer: Self-pay | Admitting: Internal Medicine

## 2024-01-25 ENCOUNTER — Ambulatory Visit (HOSPITAL_COMMUNITY)
Admission: RE | Admit: 2024-01-25 | Discharge: 2024-01-25 | Disposition: A | Discharge status: Home / Self Care | Source: Ambulatory Visit | Attending: Internal Medicine | Admitting: Internal Medicine

## 2024-01-25 DIAGNOSIS — Z780 Asymptomatic menopausal state: Secondary | ICD-10-CM | POA: Diagnosis not present

## 2024-01-25 DIAGNOSIS — Z13820 Encounter for screening for osteoporosis: Secondary | ICD-10-CM | POA: Diagnosis not present

## 2024-03-28 DIAGNOSIS — L853 Xerosis cutis: Secondary | ICD-10-CM | POA: Diagnosis not present

## 2024-03-28 DIAGNOSIS — L821 Other seborrheic keratosis: Secondary | ICD-10-CM | POA: Diagnosis not present

## 2024-03-28 DIAGNOSIS — L814 Other melanin hyperpigmentation: Secondary | ICD-10-CM | POA: Diagnosis not present

## 2024-04-13 DIAGNOSIS — R1310 Dysphagia, unspecified: Secondary | ICD-10-CM | POA: Diagnosis not present

## 2024-04-13 DIAGNOSIS — E782 Mixed hyperlipidemia: Secondary | ICD-10-CM | POA: Diagnosis not present

## 2024-04-13 DIAGNOSIS — M1812 Unilateral primary osteoarthritis of first carpometacarpal joint, left hand: Secondary | ICD-10-CM | POA: Diagnosis not present

## 2024-04-13 DIAGNOSIS — M5134 Other intervertebral disc degeneration, thoracic region: Secondary | ICD-10-CM | POA: Diagnosis not present

## 2024-04-13 DIAGNOSIS — I700 Atherosclerosis of aorta: Secondary | ICD-10-CM | POA: Diagnosis not present

## 2024-04-13 DIAGNOSIS — I10 Essential (primary) hypertension: Secondary | ICD-10-CM | POA: Diagnosis not present

## 2024-04-13 DIAGNOSIS — M4716 Other spondylosis with myelopathy, lumbar region: Secondary | ICD-10-CM | POA: Diagnosis not present

## 2024-04-13 DIAGNOSIS — Z23 Encounter for immunization: Secondary | ICD-10-CM | POA: Diagnosis not present
